# Patient Record
Sex: Female | Born: 1980 | Race: Black or African American | Hispanic: No | Marital: Single | Smoking: Never smoker
Health system: Southern US, Community
[De-identification: ages and names within clinical notes are randomized; demographics above are authoritative.]

## PROBLEM LIST (undated history)

## (undated) DIAGNOSIS — M1990 Unspecified osteoarthritis, unspecified site: Secondary | ICD-10-CM

## (undated) DIAGNOSIS — N898 Other specified noninflammatory disorders of vagina: Secondary | ICD-10-CM

## (undated) DIAGNOSIS — M25519 Pain in unspecified shoulder: Secondary | ICD-10-CM

## (undated) DIAGNOSIS — F32A Depression, unspecified: Secondary | ICD-10-CM

## (undated) DIAGNOSIS — R87629 Unspecified abnormal cytological findings in specimens from vagina: Secondary | ICD-10-CM

## (undated) DIAGNOSIS — M7918 Myalgia, other site: Secondary | ICD-10-CM

## (undated) DIAGNOSIS — I10 Essential (primary) hypertension: Secondary | ICD-10-CM

## (undated) DIAGNOSIS — T7840XA Allergy, unspecified, initial encounter: Secondary | ICD-10-CM

## (undated) DIAGNOSIS — Z8619 Personal history of other infectious and parasitic diseases: Secondary | ICD-10-CM

## (undated) DIAGNOSIS — D649 Anemia, unspecified: Secondary | ICD-10-CM

## (undated) DIAGNOSIS — B9689 Other specified bacterial agents as the cause of diseases classified elsewhere: Secondary | ICD-10-CM

## (undated) DIAGNOSIS — G8929 Other chronic pain: Secondary | ICD-10-CM

## (undated) DIAGNOSIS — J039 Acute tonsillitis, unspecified: Secondary | ICD-10-CM

## (undated) DIAGNOSIS — M47812 Spondylosis without myelopathy or radiculopathy, cervical region: Secondary | ICD-10-CM

## (undated) DIAGNOSIS — F329 Major depressive disorder, single episode, unspecified: Secondary | ICD-10-CM

## (undated) DIAGNOSIS — N946 Dysmenorrhea, unspecified: Secondary | ICD-10-CM

## (undated) DIAGNOSIS — F909 Attention-deficit hyperactivity disorder, unspecified type: Secondary | ICD-10-CM

## (undated) DIAGNOSIS — F419 Anxiety disorder, unspecified: Secondary | ICD-10-CM

## (undated) DIAGNOSIS — M545 Low back pain: Principal | ICD-10-CM

## (undated) DIAGNOSIS — R5383 Other fatigue: Secondary | ICD-10-CM

## (undated) DIAGNOSIS — N750 Cyst of Bartholin's gland: Secondary | ICD-10-CM

## (undated) DIAGNOSIS — M797 Fibromyalgia: Secondary | ICD-10-CM

## (undated) DIAGNOSIS — N920 Excessive and frequent menstruation with regular cycle: Secondary | ICD-10-CM

## (undated) DIAGNOSIS — N760 Acute vaginitis: Secondary | ICD-10-CM

## (undated) HISTORY — DX: Acute vaginitis: N76.0

## (undated) HISTORY — DX: Unspecified abnormal cytological findings in specimens from vagina: R87.629

## (undated) HISTORY — DX: Other fatigue: R53.83

## (undated) HISTORY — DX: Anxiety disorder, unspecified: F41.9

## (undated) HISTORY — DX: Other specified noninflammatory disorders of vagina: N89.8

## (undated) HISTORY — DX: Essential (primary) hypertension: I10

## (undated) HISTORY — DX: Anemia, unspecified: D64.9

## (undated) HISTORY — DX: Myalgia, other site: M79.18

## (undated) HISTORY — DX: Cyst of Bartholin's gland: N75.0

## (undated) HISTORY — DX: Low back pain: M54.5

## (undated) HISTORY — DX: Unspecified osteoarthritis, unspecified site: M19.90

## (undated) HISTORY — DX: Pain in unspecified shoulder: M25.519

## (undated) HISTORY — DX: Attention-deficit hyperactivity disorder, unspecified type: F90.9

## (undated) HISTORY — DX: Depression, unspecified: F32.A

## (undated) HISTORY — PX: LUMBAR LAMINECTOMY: SHX95

## (undated) HISTORY — PX: CRYOTHERAPY: SHX1416

## (undated) HISTORY — DX: Personal history of other infectious and parasitic diseases: Z86.19

## (undated) HISTORY — PX: CARPAL TUNNEL RELEASE: SHX101

## (undated) HISTORY — DX: Major depressive disorder, single episode, unspecified: F32.9

## (undated) HISTORY — DX: Excessive and frequent menstruation with regular cycle: N92.0

## (undated) HISTORY — DX: Dysmenorrhea, unspecified: N94.6

## (undated) HISTORY — DX: Allergy, unspecified, initial encounter: T78.40XA

## (undated) HISTORY — DX: Spondylosis without myelopathy or radiculopathy, cervical region: M47.812

## (undated) HISTORY — DX: Other chronic pain: G89.29

## (undated) HISTORY — DX: Other specified bacterial agents as the cause of diseases classified elsewhere: B96.89

---
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
## 2002-05-25 ENCOUNTER — Encounter (INDEPENDENT_AMBULATORY_CARE_PROVIDER_SITE_OTHER): Payer: Self-pay | Admitting: *Deleted

## 2002-05-26 ENCOUNTER — Other Ambulatory Visit: Admission: RE | Admit: 2002-05-26 | Discharge: 2002-05-26 | Payer: Self-pay | Admitting: Family Medicine

## 2002-05-26 ENCOUNTER — Encounter: Admission: RE | Admit: 2002-05-26 | Discharge: 2002-05-26 | Payer: Self-pay | Admitting: Family Medicine

## 2002-09-30 ENCOUNTER — Encounter: Payer: Self-pay | Admitting: Family Medicine

## 2002-09-30 ENCOUNTER — Inpatient Hospital Stay (HOSPITAL_COMMUNITY): Admission: AD | Admit: 2002-09-30 | Discharge: 2002-09-30 | Payer: Self-pay | Admitting: *Deleted

## 2002-10-11 ENCOUNTER — Inpatient Hospital Stay (HOSPITAL_COMMUNITY): Admission: AD | Admit: 2002-10-11 | Discharge: 2002-10-11 | Payer: Self-pay | Admitting: *Deleted

## 2002-11-13 ENCOUNTER — Emergency Department (HOSPITAL_COMMUNITY): Admission: EM | Admit: 2002-11-13 | Discharge: 2002-11-13 | Payer: Self-pay | Admitting: Emergency Medicine

## 2002-11-28 ENCOUNTER — Inpatient Hospital Stay (HOSPITAL_COMMUNITY): Admission: AD | Admit: 2002-11-28 | Discharge: 2002-11-28 | Payer: Self-pay | Admitting: Family Medicine

## 2002-12-16 ENCOUNTER — Ambulatory Visit (HOSPITAL_COMMUNITY): Admission: RE | Admit: 2002-12-16 | Discharge: 2002-12-16 | Payer: Self-pay | Admitting: Family Medicine

## 2003-01-29 ENCOUNTER — Inpatient Hospital Stay (HOSPITAL_COMMUNITY): Admission: AD | Admit: 2003-01-29 | Discharge: 2003-01-29 | Payer: Self-pay | Admitting: *Deleted

## 2003-02-02 ENCOUNTER — Inpatient Hospital Stay (HOSPITAL_COMMUNITY): Admission: AD | Admit: 2003-02-02 | Discharge: 2003-02-02 | Payer: Self-pay | Admitting: *Deleted

## 2003-05-07 ENCOUNTER — Inpatient Hospital Stay (HOSPITAL_COMMUNITY): Admission: AD | Admit: 2003-05-07 | Discharge: 2003-05-10 | Payer: Self-pay | Admitting: Obstetrics and Gynecology

## 2003-09-04 ENCOUNTER — Emergency Department (HOSPITAL_COMMUNITY): Admission: EM | Admit: 2003-09-04 | Discharge: 2003-09-05 | Payer: Self-pay | Admitting: Emergency Medicine

## 2003-09-18 ENCOUNTER — Emergency Department (HOSPITAL_COMMUNITY): Admission: EM | Admit: 2003-09-18 | Discharge: 2003-09-18 | Payer: Self-pay | Admitting: Emergency Medicine

## 2003-10-15 ENCOUNTER — Emergency Department (HOSPITAL_COMMUNITY): Admission: EM | Admit: 2003-10-15 | Discharge: 2003-10-15 | Payer: Self-pay | Admitting: Emergency Medicine

## 2005-10-27 ENCOUNTER — Emergency Department (HOSPITAL_COMMUNITY): Admission: AD | Admit: 2005-10-27 | Discharge: 2005-10-27 | Payer: Self-pay | Admitting: Family Medicine

## 2005-11-03 ENCOUNTER — Emergency Department (HOSPITAL_COMMUNITY): Admission: EM | Admit: 2005-11-03 | Discharge: 2005-11-03 | Payer: Self-pay | Admitting: Family Medicine

## 2006-02-22 ENCOUNTER — Inpatient Hospital Stay (HOSPITAL_COMMUNITY): Admission: AD | Admit: 2006-02-22 | Discharge: 2006-02-22 | Payer: Self-pay | Admitting: Obstetrics

## 2006-08-23 ENCOUNTER — Encounter (INDEPENDENT_AMBULATORY_CARE_PROVIDER_SITE_OTHER): Payer: Self-pay | Admitting: *Deleted

## 2006-11-29 ENCOUNTER — Inpatient Hospital Stay (HOSPITAL_COMMUNITY): Admission: AD | Admit: 2006-11-29 | Discharge: 2006-11-30 | Payer: Self-pay | Admitting: Obstetrics

## 2007-11-11 ENCOUNTER — Emergency Department (HOSPITAL_COMMUNITY): Admission: EM | Admit: 2007-11-11 | Discharge: 2007-11-11 | Payer: Self-pay | Admitting: Emergency Medicine

## 2008-02-05 ENCOUNTER — Emergency Department (HOSPITAL_COMMUNITY): Admission: EM | Admit: 2008-02-05 | Discharge: 2008-02-05 | Payer: Self-pay | Admitting: Family Medicine

## 2008-05-17 ENCOUNTER — Emergency Department (HOSPITAL_COMMUNITY): Admission: EM | Admit: 2008-05-17 | Discharge: 2008-05-17 | Payer: Self-pay | Admitting: Emergency Medicine

## 2008-11-02 ENCOUNTER — Emergency Department (HOSPITAL_COMMUNITY): Admission: EM | Admit: 2008-11-02 | Discharge: 2008-11-02 | Payer: Self-pay | Admitting: Family Medicine

## 2009-02-28 ENCOUNTER — Emergency Department (HOSPITAL_COMMUNITY): Admission: EM | Admit: 2009-02-28 | Discharge: 2009-02-28 | Payer: Self-pay | Admitting: Family Medicine

## 2009-03-17 ENCOUNTER — Emergency Department (HOSPITAL_COMMUNITY): Admission: EM | Admit: 2009-03-17 | Discharge: 2009-03-17 | Payer: Self-pay | Admitting: Emergency Medicine

## 2009-08-13 ENCOUNTER — Emergency Department (HOSPITAL_COMMUNITY): Admission: EM | Admit: 2009-08-13 | Discharge: 2009-08-13 | Payer: Self-pay | Admitting: Emergency Medicine

## 2010-09-13 LAB — POCT URINALYSIS DIP (DEVICE)
Glucose, UA: NEGATIVE mg/dL
Hgb urine dipstick: NEGATIVE
Specific Gravity, Urine: 1.025 (ref 1.005–1.030)
Urobilinogen, UA: 1 mg/dL (ref 0.0–1.0)
pH: 5.5 (ref 5.0–8.0)

## 2010-09-29 LAB — POCT URINALYSIS DIP (DEVICE)
Bilirubin Urine: NEGATIVE
Glucose, UA: NEGATIVE mg/dL
Glucose, UA: NEGATIVE mg/dL
Nitrite: NEGATIVE
Protein, ur: 100 mg/dL — AB
Specific Gravity, Urine: 1.025 (ref 1.005–1.030)
Specific Gravity, Urine: >=1.03 (ref 1.005–1.030)
Urobilinogen, UA: 0.2 mg/dL (ref 0.0–1.0)
Urobilinogen, UA: 1 mg/dL (ref 0.0–1.0)
Urobilinogen, UA: 1 mg/dL (ref 0.0–1.0)

## 2010-09-29 LAB — WET PREP, GENITAL
Clue Cells Wet Prep HPF POC: NONE SEEN
Trich, Wet Prep: NONE SEEN

## 2010-09-29 LAB — GC/CHLAMYDIA PROBE AMP, GENITAL
Chlamydia, DNA Probe: NEGATIVE
GC Probe Amp, Genital: NEGATIVE

## 2011-03-23 LAB — POCT PREGNANCY, URINE: Preg Test, Ur: NEGATIVE

## 2011-04-12 LAB — URINALYSIS, ROUTINE W REFLEX MICROSCOPIC
Bilirubin Urine: NEGATIVE
Ketones, ur: NEGATIVE
Nitrite: NEGATIVE
Urobilinogen, UA: 0.2

## 2011-04-12 LAB — POCT PREGNANCY, URINE: Preg Test, Ur: NEGATIVE

## 2011-04-12 LAB — URINE MICROSCOPIC-ADD ON

## 2011-04-12 LAB — WET PREP, GENITAL: Yeast Wet Prep HPF POC: NONE SEEN

## 2011-04-12 LAB — GC/CHLAMYDIA PROBE AMP, GENITAL: GC Probe Amp, Genital: NEGATIVE

## 2013-03-29 ENCOUNTER — Emergency Department (HOSPITAL_COMMUNITY): Payer: Medicaid Other

## 2013-03-29 ENCOUNTER — Emergency Department (HOSPITAL_COMMUNITY)
Admission: EM | Admit: 2013-03-29 | Discharge: 2013-03-29 | Disposition: A | Discharge status: Home / Self Care | Payer: Medicaid Other | Attending: Emergency Medicine | Admitting: Emergency Medicine

## 2013-03-29 DIAGNOSIS — R109 Unspecified abdominal pain: Secondary | ICD-10-CM

## 2013-03-29 DIAGNOSIS — Z3202 Encounter for pregnancy test, result negative: Secondary | ICD-10-CM | POA: Insufficient documentation

## 2013-03-29 DIAGNOSIS — M545 Low back pain, unspecified: Secondary | ICD-10-CM | POA: Insufficient documentation

## 2013-03-29 DIAGNOSIS — R1032 Left lower quadrant pain: Secondary | ICD-10-CM | POA: Insufficient documentation

## 2013-03-29 LAB — COMPREHENSIVE METABOLIC PANEL
ALT: 11 U/L (ref 0–35)
AST: 17 U/L (ref 0–37)
Albumin: 3.6 g/dL (ref 3.5–5.2)
BUN: 11 mg/dL (ref 6–23)
CO2: 28 mEq/L (ref 19–32)
Calcium: 9.4 mg/dL (ref 8.4–10.5)
Chloride: 100 mEq/L (ref 96–112)
GFR calc non Af Amer: >90 mL/min (ref >90)
Potassium: 3.5 mEq/L (ref 3.5–5.1)
Sodium: 135 mEq/L (ref 135–145)
Total Bilirubin: <0.1 mg/dL — ABNORMAL LOW (ref 0.3–1.2)
Total Protein: 7.1 g/dL (ref 6.0–8.3)

## 2013-03-29 LAB — CBC WITH DIFFERENTIAL/PLATELET
Basophils Relative: 0 % (ref 0–1)
Eosinophils Absolute: 0.1 10*3/uL (ref 0.0–0.7)
Eosinophils Relative: 1 % (ref 0–5)
Hemoglobin: 11.6 g/dL — ABNORMAL LOW (ref 12.0–15.0)
Lymphs Abs: 2.7 10*3/uL (ref 0.7–4.0)
MCH: 31.5 pg (ref 26.0–34.0)
MCHC: 34.9 g/dL (ref 30.0–36.0)
MCV: 90.2 fL (ref 78.0–100.0)
Monocytes Relative: 7 % (ref 3–12)
Neutrophils Relative %: 54 % (ref 43–77)
Platelets: 237 10*3/uL (ref 150–400)

## 2013-03-29 LAB — URINALYSIS, ROUTINE W REFLEX MICROSCOPIC
Glucose, UA: NEGATIVE mg/dL
Protein, ur: 30 mg/dL — AB

## 2013-03-29 LAB — URINE MICROSCOPIC-ADD ON

## 2013-03-29 LAB — PREGNANCY, URINE: Preg Test, Ur: NEGATIVE

## 2013-03-29 MED ORDER — TRAMADOL HCL 50 MG PO TABS: 50.0000 mg | ORAL_TABLET | Freq: Four times a day (QID) | ORAL | Status: DC | PRN

## 2013-03-29 MED ORDER — SODIUM CHLORIDE 0.9 % IV SOLN: INTRAVENOUS | Status: DC

## 2013-03-29 MED ORDER — IOHEXOL 300 MG/ML  SOLN
50.0000 mL | Freq: Once | INTRAMUSCULAR | Status: AC | PRN
  Administered 2013-03-29: 50 mL via INTRAVENOUS

## 2013-03-29 MED ORDER — SODIUM CHLORIDE 0.9 % IV BOLUS (SEPSIS)
250.0000 mL | Freq: Once | INTRAVENOUS | Status: AC
  Administered 2013-03-29: 03:00:00 via INTRAVENOUS

## 2013-03-29 MED ORDER — HYDROMORPHONE HCL PF 1 MG/ML IJ SOLN
1.0000 mg | Freq: Once | INTRAMUSCULAR | Status: AC
  Administered 2013-03-29: 1 mg via INTRAVENOUS
  Filled 2013-03-29: qty 1

## 2013-03-29 MED ORDER — ONDANSETRON HCL 4 MG/2ML IJ SOLN
4.0000 mg | Freq: Once | INTRAMUSCULAR | Status: AC
  Administered 2013-03-29: 4 mg via INTRAVENOUS
  Filled 2013-03-29: qty 2

## 2013-03-29 MED ORDER — IOHEXOL 300 MG/ML  SOLN
100.0000 mL | Freq: Once | INTRAMUSCULAR | Status: AC | PRN
  Administered 2013-03-29: 100 mL via INTRAVENOUS

## 2013-03-29 NOTE — ED Notes (Signed)
Pt having pain to left lower abd that is sharp in nature, states she was exercising the other day and wonders if she pulled something.  Pt also c/o lower back pain

## 2013-03-29 NOTE — ED Notes (Signed)
Patient in room cursing and complaining about the time she has been waiting for discharge. Charge nurse brenda at side. Informed her dr. Fredderick Severance was in another patient's room and  would see her asap

## 2013-03-29 NOTE — ED Provider Notes (Signed)
CSN: [id number]     Arrival date & time 03/29/13  0004 History   First MD Initiated Contact with Patient 03/29/13 0242     Chief Complaint  Patient presents with  . Abdominal Pain  . Back Pain   (Consider location/radiation/quality/duration/timing/severity/associated sxs/prior Treatment) Patient is a 32 y.o. female presenting with abdominal pain and back pain. The history is provided by the patient.  Abdominal Pain Associated symptoms: no chest pain, no dysuria, no fever, no hematuria, no nausea, no shortness of breath and no vomiting   Back Pain Associated symptoms: abdominal pain   Associated symptoms: no chest pain, no dysuria, no fever and no headaches    patient with onset of left lower corner abdominal pain sharp in nature 7-8/10. Nonradiating. That started yesterday. Patient initially thought maybe she pulled something with exercising earlier however the pain is not made worse by movement. Not associated with nausea vomiting or diarrhea no fever. Patient also does have some mild low back pain on the left side. Patient without a history of similar problems.  No past medical history on file. No past surgical history on file. No family history on file. History  Substance Use Topics  . Smoking status: Not on file  . Smokeless tobacco: Not on file  . Alcohol Use: Not on file   OB History   No data available     Review of Systems  Constitutional: Negative for fever.  HENT: Negative for congestion.   Eyes: Negative for redness.  Respiratory: Negative for shortness of breath.   Cardiovascular: Negative for chest pain.  Gastrointestinal: Positive for abdominal pain. Negative for nausea and vomiting.  Genitourinary: Negative for dysuria and hematuria.  Musculoskeletal: Positive for back pain.  Neurological: Negative for headaches.  Hematological: Does not bruise/bleed easily.  Psychiatric/Behavioral: Negative for confusion.    Allergies  Review of patient's allergies  indicates no known allergies.  Home Medications   Current Outpatient Rx  Name  Route  Sig  Dispense  Refill  . naproxen (NAPROSYN) 250 MG tablet   Oral   Take 250 mg by mouth 2 (two) times daily as needed.         . traMADol (ULTRAM) 50 MG tablet   Oral   Take 1 tablet (50 mg total) by mouth every 6 (six) hours as needed.   20 tablet   0    BP 114/60  Pulse 75  Temp(Src) 97.8 F (36.6 C) (Oral)  Resp 16  Ht 5\' 6"  (1.676 m)  Wt 170 lb (77.111 kg)  BMI 27.45 kg/m2  SpO2 98%  LMP 03/19/2013 Physical Exam  Nursing note and vitals reviewed. Constitutional: She is oriented to person, place, and time. She appears well-developed and well-nourished. No distress.  HENT:  Head: Normocephalic and atraumatic.  Mouth/Throat: Oropharynx is clear and moist.  Eyes: Conjunctivae and EOM are normal. Pupils are equal, round, and reactive to light.  Neck: Normal range of motion.  Cardiovascular: Normal rate and regular rhythm.   No murmur heard. Pulmonary/Chest: Effort normal and breath sounds normal. No respiratory distress.  Abdominal: Soft. Bowel sounds are normal. There is tenderness.  Mild tenderness left lower quadrant no guarding  Musculoskeletal: Normal range of motion. She exhibits no edema.  Neurological: She is alert and oriented to person, place, and time. No cranial nerve deficit. She exhibits normal muscle tone. Coordination normal.  Skin: Skin is warm.    ED Course  Procedures (including critical care time) Labs Review Labs Reviewed  URINALYSIS,  ROUTINE W REFLEX MICROSCOPIC - Abnormal; Notable for the following:    Specific Gravity, Urine >1.030 (*)    Hgb urine dipstick TRACE (*)    Protein, ur 30 (*)    All other components within normal limits  URINE MICROSCOPIC-ADD ON - Abnormal; Notable for the following:    Squamous Epithelial / LPF MANY (*)    Bacteria, UA MANY (*)    All other components within normal limits  COMPREHENSIVE METABOLIC PANEL - Abnormal;  Notable for the following:    Glucose, Bld 102 (*)    Total Bilirubin <0.1 (*)    All other components within normal limits  CBC WITH DIFFERENTIAL - Abnormal; Notable for the following:    RBC 3.68 (*)    Hemoglobin 11.6 (*)    HCT 33.2 (*)    All other components within normal limits  URINE CULTURE  PREGNANCY, URINE   Results for orders placed during the hospital encounter of 03/29/13  URINALYSIS, ROUTINE W REFLEX MICROSCOPIC      Result Value Range   Color, Urine YELLOW  YELLOW   APPearance CLEAR  CLEAR   Specific Gravity, Urine >1.030 (*) 1.005 - 1.030   pH 6.0  5.0 - 8.0   Glucose, UA NEGATIVE  NEGATIVE mg/dL   Hgb urine dipstick TRACE (*) NEGATIVE   Bilirubin Urine NEGATIVE  NEGATIVE   Ketones, ur NEGATIVE  NEGATIVE mg/dL   Protein, ur 30 (*) NEGATIVE mg/dL   Urobilinogen, UA 0.2  0.0 - 1.0 mg/dL   Nitrite NEGATIVE  NEGATIVE   Leukocytes, UA NEGATIVE  NEGATIVE  PREGNANCY, URINE      Result Value Range   Preg Test, Ur NEGATIVE  NEGATIVE  URINE MICROSCOPIC-ADD ON      Result Value Range   Squamous Epithelial / LPF MANY (*) RARE   WBC, UA 3-6  <3 WBC/hpf   RBC / HPF 0-2  <3 RBC/hpf   Bacteria, UA MANY (*) RARE  COMPREHENSIVE METABOLIC PANEL      Result Value Range   Sodium 135  135 - 145 mEq/L   Potassium 3.5  3.5 - 5.1 mEq/L   Chloride 100  96 - 112 mEq/L   CO2 28  19 - 32 mEq/L   Glucose, Bld 102 (*) 70 - 99 mg/dL   BUN 11  6 - 23 mg/dL   Creatinine, Ser 1.61  0.50 - 1.10 mg/dL   Calcium 9.4  8.4 - 09.6 mg/dL   Total Protein 7.1  6.0 - 8.3 g/dL   Albumin 3.6  3.5 - 5.2 g/dL   AST 17  0 - 37 U/L   ALT 11  0 - 35 U/L   Alkaline Phosphatase 48  39 - 117 U/L   Total Bilirubin <0.1 (*) 0.3 - 1.2 mg/dL   GFR calc non Af Amer >90  >90 mL/min   GFR calc Af Amer >90  >90 mL/min  CBC WITH DIFFERENTIAL      Result Value Range   WBC 7.3  4.0 - 10.5 K/uL   RBC 3.68 (*) 3.87 - 5.11 MIL/uL   Hemoglobin 11.6 (*) 12.0 - 15.0 g/dL   HCT 04.5 (*) 40.9 - 81.1 %   MCV 90.2   78.0 - 100.0 fL   MCH 31.5  26.0 - 34.0 pg   MCHC 34.9  30.0 - 36.0 g/dL   RDW 91.4  78.2 - 95.6 %   Platelets 237  150 - 400 K/uL   Neutrophils Relative %  54  43 - 77 %   Neutro Abs 3.9  1.7 - 7.7 K/uL   Lymphocytes Relative 38  12 - 46 %   Lymphs Abs 2.7  0.7 - 4.0 K/uL   Monocytes Relative 7  3 - 12 %   Monocytes Absolute 0.5  0.1 - 1.0 K/uL   Eosinophils Relative 1  0 - 5 %   Eosinophils Absolute 0.1  0.0 - 0.7 K/uL   Basophils Relative 0  0 - 1 %   Basophils Absolute 0.0  0.0 - 0.1 K/uL   Patient's CT scan results of her abdomen pelvis never crossed over. However over report and written report was provided on paper. The impression was no acute intra-abdominal or pelvic process. There was level scoliosis with associated sacralized left L5 vertebral and mild left hip osteoarthritis.  Patient informed of the results. Labs without any snig. abnormalities. Etiology of the abdominal pain is not clear. Imaging Review No results found.  MDM   1. Abdominal pain    Patient improved in the emergency department with pain medication workup without any specific findings. No leukocytosis no liver function test abnormalities. No urinalysis consistent with urinary tract infection CT of abdomen and pelvis without any findings to explain the abdominal pain. Patient was sent home with tramadol and resource guide provided to help her find a local Dr. she is new to the area.  Patient did become upset when we were not able to get the CT scan results in a timely manner she was told that if she needed to go home to her children she could go and she could come back for the results. Patient decided to stay. We eventually got a written report of the CT results.   Shelda Jakes, MD 03/29/13 [phone]

## 2013-03-31 LAB — URINE CULTURE

## 2013-05-19 ENCOUNTER — Ambulatory Visit: Payer: Self-pay | Admitting: Family Medicine

## 2013-06-04 ENCOUNTER — Emergency Department (HOSPITAL_COMMUNITY): Payer: Medicaid Other

## 2013-06-04 ENCOUNTER — Emergency Department (HOSPITAL_COMMUNITY)
Admission: EM | Admit: 2013-06-04 | Discharge: 2013-06-04 | Disposition: A | Discharge status: Home / Self Care | Payer: Medicaid Other | Attending: Emergency Medicine | Admitting: Emergency Medicine

## 2013-06-04 ENCOUNTER — Encounter (HOSPITAL_COMMUNITY): Payer: Self-pay | Admitting: Emergency Medicine

## 2013-06-04 DIAGNOSIS — M542 Cervicalgia: Secondary | ICD-10-CM | POA: Insufficient documentation

## 2013-06-04 DIAGNOSIS — Z87891 Personal history of nicotine dependence: Secondary | ICD-10-CM | POA: Insufficient documentation

## 2013-06-04 DIAGNOSIS — Z87828 Personal history of other (healed) physical injury and trauma: Secondary | ICD-10-CM | POA: Insufficient documentation

## 2013-06-04 DIAGNOSIS — J029 Acute pharyngitis, unspecified: Secondary | ICD-10-CM | POA: Insufficient documentation

## 2013-06-04 DIAGNOSIS — R259 Unspecified abnormal involuntary movements: Secondary | ICD-10-CM | POA: Insufficient documentation

## 2013-06-04 LAB — RAPID STREP SCREEN (MED CTR MEBANE ONLY): Streptococcus, Group A Screen (Direct): NEGATIVE

## 2013-06-04 MED ORDER — HYDROCODONE-ACETAMINOPHEN 5-325 MG PO TABS: 2.0000 | ORAL_TABLET | ORAL | Status: DC | PRN

## 2013-06-04 MED ORDER — KETOROLAC TROMETHAMINE 60 MG/2ML IM SOLN
60.0000 mg | Freq: Once | INTRAMUSCULAR | Status: AC
  Administered 2013-06-04: 60 mg via INTRAMUSCULAR
  Filled 2013-06-04: qty 2
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
## 2013-06-05 ENCOUNTER — Encounter (HOSPITAL_COMMUNITY): Payer: Self-pay | Admitting: Emergency Medicine

## 2013-06-05 ENCOUNTER — Inpatient Hospital Stay: Admission: RE | Admit: 2013-06-05 | Payer: Self-pay | Admitting: Internal Medicine

## 2013-06-05 ENCOUNTER — Inpatient Hospital Stay (HOSPITAL_COMMUNITY)
Admission: EM | Admit: 2013-06-05 | Discharge: 2013-06-08 | DRG: 133 | Disposition: A | Discharge status: Home / Self Care | Payer: Medicaid Other | Attending: Internal Medicine | Admitting: Internal Medicine

## 2013-06-05 ENCOUNTER — Emergency Department (HOSPITAL_COMMUNITY): Payer: Medicaid Other

## 2013-06-05 DIAGNOSIS — Z87891 Personal history of nicotine dependence: Secondary | ICD-10-CM

## 2013-06-05 DIAGNOSIS — J039 Acute tonsillitis, unspecified: Secondary | ICD-10-CM | POA: Diagnosis present

## 2013-06-05 DIAGNOSIS — J351 Hypertrophy of tonsils: Secondary | ICD-10-CM | POA: Diagnosis present

## 2013-06-05 DIAGNOSIS — D72829 Elevated white blood cell count, unspecified: Secondary | ICD-10-CM

## 2013-06-05 DIAGNOSIS — J36 Peritonsillar abscess: Principal | ICD-10-CM

## 2013-06-05 DIAGNOSIS — E871 Hypo-osmolality and hyponatremia: Secondary | ICD-10-CM

## 2013-06-05 DIAGNOSIS — E876 Hypokalemia: Secondary | ICD-10-CM

## 2013-06-05 HISTORY — DX: Acute tonsillitis, unspecified: J03.90

## 2013-06-05 LAB — BASIC METABOLIC PANEL
BUN: 7 mg/dL (ref 6–23)
CO2: 25 mEq/L (ref 19–32)
Calcium: 8.4 mg/dL (ref 8.4–10.5)
Chloride: 96 mEq/L (ref 96–112)
Creatinine, Ser: 0.63 mg/dL (ref 0.50–1.10)
Glucose, Bld: 117 mg/dL — ABNORMAL HIGH (ref 70–99)
Potassium: 3.2 mEq/L — ABNORMAL LOW (ref 3.5–5.1)

## 2013-06-05 LAB — CBC WITH DIFFERENTIAL/PLATELET
Basophils Absolute: 0 10*3/uL (ref 0.0–0.1)
Eosinophils Relative: 0 % (ref 0–5)
HCT: 32.7 % — ABNORMAL LOW (ref 36.0–46.0)
Hemoglobin: 11 g/dL — ABNORMAL LOW (ref 12.0–15.0)
Lymphocytes Relative: 5 % — ABNORMAL LOW (ref 12–46)
MCV: 89.8 fL (ref 78.0–100.0)
Monocytes Absolute: 1.2 10*3/uL — ABNORMAL HIGH (ref 0.1–1.0)
Monocytes Relative: 7 % (ref 3–12)
Neutro Abs: 15 10*3/uL — ABNORMAL HIGH (ref 1.7–7.7)
Platelets: 217 10*3/uL (ref 150–400)
RDW: 13.1 % (ref 11.5–15.5)
WBC: 17 10*3/uL — ABNORMAL HIGH (ref 4.0–10.5)

## 2013-06-05 MED ORDER — ACETAMINOPHEN 650 MG RE SUPP: 650.0000 mg | Freq: Four times a day (QID) | RECTAL | Status: DC | PRN

## 2013-06-05 MED ORDER — IOHEXOL 300 MG/ML  SOLN
75.0000 mL | Freq: Once | INTRAMUSCULAR | Status: AC | PRN
  Administered 2013-06-05: 75 mL via INTRAVENOUS

## 2013-06-05 MED ORDER — MORPHINE SULFATE 2 MG/ML IJ SOLN
1.0000 mg | INTRAMUSCULAR | Status: DC | PRN
  Administered 2013-06-05 – 2013-06-06 (×3): 1 mg via INTRAVENOUS
  Filled 2013-06-05 (×4): qty 1

## 2013-06-05 MED ORDER — ONDANSETRON HCL 4 MG/2ML IJ SOLN
4.0000 mg | Freq: Once | INTRAMUSCULAR | Status: AC
  Administered 2013-06-05: 4 mg via INTRAVENOUS
  Filled 2013-06-05: qty 2

## 2013-06-05 MED ORDER — DEXTROSE 5 % IV SOLN
1.0000 g | INTRAVENOUS | Status: DC
  Administered 2013-06-05: 1 g via INTRAVENOUS
  Filled 2013-06-05 (×2): qty 10

## 2013-06-05 MED ORDER — MORPHINE SULFATE 4 MG/ML IJ SOLN
4.0000 mg | Freq: Once | INTRAMUSCULAR | Status: AC
  Administered 2013-06-05: 4 mg via INTRAVENOUS
  Filled 2013-06-05: qty 1

## 2013-06-05 MED ORDER — SODIUM CHLORIDE 0.9 % IV BOLUS (SEPSIS)
500.0000 mL | Freq: Once | INTRAVENOUS | Status: AC
  Administered 2013-06-05: 1000 mL via INTRAVENOUS

## 2013-06-05 MED ORDER — ENOXAPARIN SODIUM 40 MG/0.4ML ~~LOC~~ SOLN
40.0000 mg | SUBCUTANEOUS | Status: DC
  Administered 2013-06-05: 17:00:00 40 mg via SUBCUTANEOUS
  Filled 2013-06-05 (×2): qty 0.4

## 2013-06-05 MED ORDER — ACETAMINOPHEN 325 MG PO TABS: 650.0000 mg | ORAL_TABLET | Freq: Four times a day (QID) | ORAL | Status: DC | PRN

## 2013-06-05 MED ORDER — ONDANSETRON HCL 4 MG PO TABS: 4.0000 mg | ORAL_TABLET | Freq: Four times a day (QID) | ORAL | Status: DC | PRN

## 2013-06-05 MED ORDER — SODIUM CHLORIDE 0.9 % IV SOLN
3.0000 g | Freq: Once | INTRAVENOUS | Status: AC
  Administered 2013-06-05: 3 g via INTRAVENOUS
  Filled 2013-06-05: qty 3

## 2013-06-05 MED ORDER — POTASSIUM CHLORIDE IN NACL 20-0.9 MEQ/L-% IV SOLN
INTRAVENOUS | Status: DC
  Administered 2013-06-05: 125 mL/h via INTRAVENOUS
  Administered 2013-06-06 – 2013-06-07 (×2): via INTRAVENOUS
  Administered 2013-06-07: 1000 mL via INTRAVENOUS
  Administered 2013-06-07: 19:00:00 via INTRAVENOUS
  Filled 2013-06-05 (×10): qty 1000

## 2013-06-05 MED ORDER — ONDANSETRON HCL 4 MG/2ML IJ SOLN: 4.0000 mg | Freq: Four times a day (QID) | INTRAMUSCULAR | Status: DC | PRN

## 2013-06-05 MED ORDER — PHENOL 1.4 % MT LIQD
1.0000 | OROMUCOSAL | Status: DC | PRN
  Administered 2013-06-05: 22:00:00 1 via OROMUCOSAL
  Filled 2013-06-05: qty 177

## 2013-06-05 MED ORDER — ACETAMINOPHEN 650 MG RE SUPP
650.0000 mg | Freq: Once | RECTAL | Status: AC
  Administered 2013-06-05: 650 mg via RECTAL
  Filled 2013-06-05: qty 1

## 2013-06-05 MED ORDER — SODIUM CHLORIDE 0.9 % IV SOLN
INTRAVENOUS | Status: AC
  Administered 2013-06-05: 15:00:00 125 mL/h via INTRAVENOUS

## 2013-06-05 MED ORDER — IBUPROFEN 600 MG PO TABS
600.0000 mg | ORAL_TABLET | Freq: Four times a day (QID) | ORAL | Status: DC | PRN
  Administered 2013-06-06: 600 mg via ORAL
  Filled 2013-06-05: qty 1

## 2013-06-05 MED ORDER — ACETAMINOPHEN 160 MG/5ML PO SOLN
650.0000 mg | Freq: Once | ORAL | Status: DC
  Filled 2013-06-05: qty 20.3

## 2013-06-05 NOTE — Progress Notes (Signed)
Veronica [name] [id number]  Code Status: FUll3  Admission Data: 06/05/2013 3:04 PM  Attending Provider: Clent Ridges PCP:No PCP Per Patient  Consults/ Treatment Team:    Veronica [name] is a 32 y.o. female patient admitted from ED awake, alert - oriented X 3 - no acute distress noted. VSS - Blood pressure 140/76, pulse 82, temperature 100.6 F (38.1 C), temperature source Oral, resp. rate 23, last menstrual period 05/28/2013, SpO2 99.00%. no c/o shortness of breath, no c/o chest pain. IV Fluids: IV in place, occlusive dsg intact without redness, IV cath forearm right, condition patent and no redness  normal saline.  Allergies: No Known Allergies    History reviewed. No pertinent past medical history.  Medications Prior to Admission  Medication Sig Dispense Refill  . HYDROcodone-acetaminophen (NORCO) 5-325 MG per tablet Take 2 tablets by mouth every 4 (four) hours as needed.  6 tablet  0    History: obtained from the patient.   Orientation to room, and floor completed with information packet given to patient/family. Admission INP armband ID verified with patient/family, and in place.  SR up x 2, fall assessment complete, with patient able to verbalize understanding of risk associated with falls, and verbalized understanding to call nsg before up out of bed. Call light within reach, patient able to voice, and demonstrate understanding. Skin, clean-dry- intact without evidence of bruising, or skin tears.  No evidence of skin break down noted on exam.  ?  Will cont to eval and treat per MD orders.  Al Decant, [place]  06/05/2013 3:04 PM

## 2013-06-05 NOTE — Care Management Note (Signed)
    Page 1 of 1   06/09/2013     3:18:46 PM   CARE MANAGEMENT NOTE 06/09/2013  Patient:  Veronica [name], Veronica [name]   Account Number:  [id number]  Date Initiated:  06/05/2013  Documentation initiated by:  Letha Cape  Subjective/Objective Assessment:   dx tonsillitis  admit- from home. pta indep.     Action/Plan:   Anticipated DC Date:  06/08/2013   Anticipated DC Plan:  HOME/SELF CARE      DC Planning Services  CM consult      Choice offered to / List presented to:             Status of service:  Completed, signed off Medicare Important Message given?   (If response is "NO", the following Medicare IM given date fields will be blank) Date Medicare IM given:   Date Additional Medicare IM given:    Discharge Disposition:  HOME/SELF CARE  Per UR Regulation:  Reviewed for med. necessity/level of care/duration of stay  If discussed at Long Length of Stay Meetings, dates discussed:    Comments:  06/09/13 15:17  Letha Cape RN, BSN [phone] patient dc home, no NCM referral , no needs anticipated.  06/05/13 16:25 Letha Cape RN, BSN [phone] patient from home, per progress note, patient did not take abx.  NCM will continue to follow for dc needs.

## 2013-06-05 NOTE — ED Notes (Signed)
Pt reports sore throat and ear pain for the past 4 days.  Pt reports difficulty swallowing d/t pain.  Pt denies any fever.

## 2013-06-05 NOTE — H&P (Addendum)
[facility] History and Physical  Veronica [name] WJX:[id number] DOB: 03/17/1981 DOA: 06/05/2013  Referring physician: EDP PCP: No PCP Per Patient   Chief Complaint: throat pain  HPI: Veronica [name] is a 32 y.o. female  Who was seen in [facility] ER on 12/11- given an abx for tonsilitis that she never filled.  Strep screen is still pending.   Went back to the ER today 12/12 with worsening throat pain, and painful swallowing.  Patient denies trouble breathing.  Admits to fever.  Positive sick contacts Left ear pain and sore lymph nodes  In the ER, labs showed a WBC count and a CT was done that showed no clear abscess.  Patient was sent from [facility]: [name] for possible ENT evaluation.   Review of Systems: Systems reviewed.  As above, otherwise negative  History reviewed. No pertinent past medical history. Past Surgical History  Procedure Laterality Date  . Brain surgery     Social History:  reports that she has quit smoking. She does not have any smokeless tobacco history on file. She reports that she does not drink alcohol or use illicit drugs.  No Known Allergies  Family Hx: HTN  Prior to Admission medications   Medication Sig Start Date End Date Taking? Authorizing Provider  HYDROcodone-acetaminophen (NORCO) 5-325 MG per tablet Take 2 tablets by mouth every 4 (four) hours as needed. 06/04/13  Yes Geoffery Lyons, MD   Physical Exam: Filed Vitals:   06/05/13 1518  BP: 141/78  Pulse: 73  Temp: 99.7 F (37.6 C)  Resp: 20    BP 141/78  Pulse 73  Temp(Src) 99.7 F (37.6 C) (Oral)  Resp 20  Ht 5\' 6"  (1.676 m)  Wt 74.707 kg (164 lb 11.2 oz)  BMI 26.60 kg/m2  SpO2 99%  LMP 05/28/2013  General appearance: alert and no distress Head: Normocephalic, without obvious abnormality, atraumatic Eyes: negative findings: pupils equal, round, reactive to light and accomodation Ears: normal TM's and external ear canals both ears Nose: Nares normal. Septum midline. Mucosa normal. No drainage  or sinus tenderness., no discharge Throat: abnormal findings: cheilitis Lungs: clear to auscultation bilaterally and normal percussion bilaterally Heart: regular rate and rhythm, S1, S2 normal, no murmur, click, rub or gallop Abdomen: soft, non-tender; bowel sounds normal; no masses,  no organomegaly Extremities: no edema, redness or tenderness in the calves or thighs Skin: Skin color, texture, turgor normal. No rashes or lesions Lymph nodes: Cervical adenopathy: anterior chains  Ill appearing- on phone speaking with son's school, No increased work of breathing       Dry mucous membranes; tongue dry with patches of white material, throat red Clear  Regular, no murmur +BS, soft, NT  No edema                       Tender LN chains in anterior neck- swollen         Labs on Admission:  Basic Metabolic Panel:  Recent Labs Lab 06/05/13 1042  NA 132*  K 3.2*  CL 96  CO2 25  GLUCOSE 117*  BUN 7  CREATININE 0.63  CALCIUM 8.4   Liver Function Tests: No results found for this basename: AST, ALT, ALKPHOS, BILITOT, PROT, ALBUMIN,  in the last 168 hours No results found for this basename: LIPASE, AMYLASE,  in the last 168 hours No results found for this basename: AMMONIA,  in the last 168 hours CBC:  Recent Labs Lab 06/05/13 1042  WBC 17.0*  NEUTROABS 15.0*  HGB 11.0*  HCT 32.7*  MCV 89.8  PLT 217   Cardiac Enzymes: No results found for this basename: CKTOTAL, CKMB, CKMBINDEX, TROPONINI,  in the last 168 hours  BNP (last 3 results) No results found for this basename: PROBNP,  in the last 8760 hours CBG: No results found for this basename: GLUCAP,  in the last 168 hours  Radiological Exams on Admission: Dg Cervical Spine Complete  06/04/2013   CLINICAL DATA:  Sore throat; left-sided neck pain.  EXAM: CERVICAL SPINE  4+ VIEWS  COMPARISON:  Cervical spine radiographs performed 05/17/2008  FINDINGS: There is no evidence of fracture or  subluxation. Vertebral bodies demonstrate normal height and alignment. Intervertebral disc spaces are preserved. Prevertebral soft tissues are within normal limits. The provided odontoid view demonstrates no significant abnormality.  The visualized lung apices are clear.  IMPRESSION: No evidence of fracture or subluxation along the cervical spine.   Electronically Signed   By: Roanna Raider M.D.   On: 06/04/2013 03:29   Ct Soft Tissue Neck W Contrast  06/05/2013   ADDENDUM REPORT: 06/05/2013 11:39  ADDENDUM: In the Impression: The airway is shifted to the right and remains patent. The sentence that states: Airway shifted to the right below the knee no remains patent , was inadvertently transcribed.   Electronically Signed   By: Salome Holmes M.D.   On: 06/05/2013 11:39   06/05/2013   CLINICAL DATA:  Esophageal, neck, and ear pain, difficulty swallowing  EXAM: CT NECK WITH CONTRAST  TECHNIQUE: Multidetector CT imaging of the neck was performed using the standard protocol following the bolus administration of intravenous contrast.  CONTRAST:  75mL OMNIPAQUE IOHEXOL 300 MG/ML  SOLN  COMPARISON:  None.  FINDINGS: Skull base is unremarkable.  There is diffuse enlargement of the tonsils on the left with a heterogeneous enhancement. No focal loculated fluid collections are identified. There is a mass effect upon the airway with shift to the right the airway is otherwise patent. There is obliteration of the parapharyngeal space on the left. An area of low attenuation projects within the prevertebral space, consistent with a component of prevertebral edema. The parapharyngeal space on the right is partially maintained.  Adenopathy is identified within the carotid chain lymph nodes largest on the left measuring 1.4 cm in short axis image 46 series 2. There is heterogeneous enhancement of the inferior aspect the salivary gland on the left. Prominent submandibular lymph nodes are identified.  The parotid glands are  symmetric. The right submandibular gland is unremarkable. Opacified vascular structures unremarkable. There is no evidence of free fluid or loculated fluid collections within the neck. The glottic and laryngeal regions are unremarkable. Evaluation of thoracic inlet is unremarkable. The lung apices are unremarkable.  IMPRESSION: Findings consistent with tonsillitis without definitive loculated fluid collection. Phlegmonous changes within the tonsils is a diagnostic consideration. These findings are appreciated on the left. Airway shifted to the right below-knee no remains patent. Adenopathy is appreciated within anterior and posterior cervical chain lymph nodes left greater than right. There findings which may represent a component of inflammatory changes within the salivary gland on the left.  Electronically Signed: By: Salome Holmes M.D. On: 06/05/2013 11:02      Assessment/Plan Active Problems:   Tonsillitis   Leukocytosis, unspecified   Hypokalemia   1. Tonsillitis- IV abx-rocephin, vital stable, as patient never filled her abx will give and if no better in AM, will consider ENT consult in AM- no abscess seen  on CT scan; no respiratory distress or decompensation- on room air 2. Leukocytosis- secondary to #1- monitor 3. Hypokalemia- replete in IVF 4. Hyponatremia- IVF    Code Status: full Family Communication: patient Disposition Plan: 2-3 days  Time spent: 75 min  Marlin Canary [facility] Pager [phone]

## 2013-06-05 NOTE — ED Provider Notes (Signed)
CSN: [id number]     Arrival date & time 06/05/13  7829 History   First MD Initiated Contact with Patient 06/05/13 0928     This chart was scribed for [facility] R. Rubin Payor, MD by Ellin Mayhew, ED Scribe. This patient was seen in room APA17/APA17 and the patient's care was started at 9:31 AM.   Chief Complaint  Patient presents with  . Sore Throat  . Otalgia   The history is provided by the patient. No language interpreter was used.   HPI Comments: Veronica [name] is a 32 y.o. female who presents to the Emergency Department complaining of sore throat diffusely, but mostly in the front, with difficulty swallowing and associated productive cough (yellow sputum). Patient also complains of ear pain and. Patient reports that the symptoms began four days ago; however, she was seen in the ED yesterday. Patient was prescribed NORCO with no improvement. She reports having had a low grade fever. She denies any possibility of being pregnant. She denies any allergies to medications.  History reviewed. No pertinent past medical history. Past Surgical History  Procedure Laterality Date  . Brain surgery     No family history on file. History  Substance Use Topics  . Smoking status: Former Games developer  . Smokeless tobacco: Not on file  . Alcohol Use: No   OB History   Grav Para Term Preterm Abortions TAB SAB Ect Mult Living                 Review of Systems  Constitutional: Positive for fever.  HENT: Positive for ear pain and sore throat.   Respiratory: Positive for cough.   Gastrointestinal: Positive for nausea.  All other systems reviewed and are negative.    Allergies  Review of patient's allergies indicates no known allergies.  Home Medications   Current Outpatient Rx  Name  Route  Sig  Dispense  Refill  . HYDROcodone-acetaminophen (NORCO) 5-325 MG per tablet   Oral   Take 2 tablets by mouth every 4 (four) hours as needed.   6 tablet   0    Triage Vitals: BP 142/80  Pulse 101   Temp(Src) 99.5 F (37.5 C) (Oral)  Resp 20  SpO2 98%  LMP 05/28/2013  Physical Exam  Nursing note and vitals reviewed. Constitutional: She is oriented to person, place, and time. She appears well-developed and well-nourished.  Appears unconfortable  HENT:  Head: Normocephalic and atraumatic.  Difficult to see posterior pharynx. Swelling to the peritonsillars (left greater than right). Difficult to view.  Eyes: EOM are normal. Pupils are equal, round, and reactive to light.  Cardiovascular: Normal rate, regular rhythm and normal heart sounds.   Pulmonary/Chest: Effort normal and breath sounds normal. No stridor.  Abdominal: Soft. Bowel sounds are normal.  Musculoskeletal: Normal range of motion. She exhibits no edema.  Lymphadenopathy:    She has cervical adenopathy (Anterior).  Neurological: She is alert and oriented to person, place, and time.    ED Course  Procedures (including critical care time) Labs Review  Results for orders placed during the hospital encounter of 06/05/13  CBC WITH DIFFERENTIAL      Result Value Range   WBC 17.0 (*) 4.0 - 10.5 K/uL   RBC 3.64 (*) 3.87 - 5.11 MIL/uL   Hemoglobin 11.0 (*) 12.0 - 15.0 g/dL   HCT 56.2 (*) 13.0 - 86.5 %   MCV 89.8  78.0 - 100.0 fL   MCH 30.2  26.0 - 34.0 pg  MCHC 33.6  30.0 - 36.0 g/dL   RDW 81.1  91.4 - 78.2 %   Platelets 217  150 - 400 K/uL   Neutrophils Relative % 88 (*) 43 - 77 %   Neutro Abs 15.0 (*) 1.7 - 7.7 K/uL   Lymphocytes Relative 5 (*) 12 - 46 %   Lymphs Abs 0.8  0.7 - 4.0 K/uL   Monocytes Relative 7  3 - 12 %   Monocytes Absolute 1.2 (*) 0.1 - 1.0 K/uL   Eosinophils Relative 0  0 - 5 %   Eosinophils Absolute 0.0  0.0 - 0.7 K/uL   Basophils Relative 0  0 - 1 %   Basophils Absolute 0.0  0.0 - 0.1 K/uL  BASIC METABOLIC PANEL      Result Value Range   Sodium 132 (*) 135 - 145 mEq/L   Potassium 3.2 (*) 3.5 - 5.1 mEq/L   Chloride 96  96 - 112 mEq/L   CO2 25  19 - 32 mEq/L   Glucose, Bld 117 (*) 70 -  99 mg/dL   BUN 7  6 - 23 mg/dL   Creatinine, Ser 9.56  0.50 - 1.10 mg/dL   Calcium 8.4  8.4 - 21.3 mg/dL   GFR calc non Af Amer >90  >90 mL/min   GFR calc Af Amer >90  >90 mL/min    DIAGNOSTIC STUDIES: Oxygen Saturation is 98% on room air, normal by my interpretation.    COORDINATION OF CARE: 9:32 AM-Discussed suspicion of pus collection and ordered anti-nausea and pain medication to get a closer look. Treatment plan discussed with patient and patient agrees.  10:23 AM-F/U with patient after anti-nausea and pain medicine given. Rechecked the pharynx and confirmed the L side is worse. Will order CT scan to assist with visibility. Will F/U with patient following imaging results. Treatment plan discussed with patient at bedside and patient agrees.   Labs Reviewed  CBC WITH DIFFERENTIAL - Abnormal; Notable for the following:    WBC 17.0 (*)    RBC 3.64 (*)    Hemoglobin 11.0 (*)    HCT 32.7 (*)    Neutrophils Relative % 88 (*)    Neutro Abs 15.0 (*)    Lymphocytes Relative 5 (*)    Monocytes Absolute 1.2 (*)    All other components within normal limits  BASIC METABOLIC PANEL - Abnormal; Notable for the following:    Sodium 132 (*)    Potassium 3.2 (*)    Glucose, Bld 117 (*)    All other components within normal limits    Imaging Review Dg Cervical Spine Complete  06/04/2013   CLINICAL DATA:  Sore throat; left-sided neck pain.  EXAM: CERVICAL SPINE  4+ VIEWS  COMPARISON:  Cervical spine radiographs performed 05/17/2008  FINDINGS: There is no evidence of fracture or subluxation. Vertebral bodies demonstrate normal height and alignment. Intervertebral disc spaces are preserved. Prevertebral soft tissues are within normal limits. The provided odontoid view demonstrates no significant abnormality.  The visualized lung apices are clear.  IMPRESSION: No evidence of fracture or subluxation along the cervical spine.   Electronically Signed   By: Roanna Raider M.D.   On: 06/04/2013 03:29    Ct Soft Tissue Neck W Contrast  06/05/2013   ADDENDUM REPORT: 06/05/2013 11:39  ADDENDUM: In the Impression: The airway is shifted to the right and remains patent. The sentence that states: Airway shifted to the right below the knee no remains patent , was inadvertently transcribed.  Electronically Signed   By: Salome Holmes M.D.   On: 06/05/2013 11:39   06/05/2013   CLINICAL DATA:  Esophageal, neck, and ear pain, difficulty swallowing  EXAM: CT NECK WITH CONTRAST  TECHNIQUE: Multidetector CT imaging of the neck was performed using the standard protocol following the bolus administration of intravenous contrast.  CONTRAST:  75mL OMNIPAQUE IOHEXOL 300 MG/ML  SOLN  COMPARISON:  None.  FINDINGS: Skull base is unremarkable.  There is diffuse enlargement of the tonsils on the left with a heterogeneous enhancement. No focal loculated fluid collections are identified. There is a mass effect upon the airway with shift to the right the airway is otherwise patent. There is obliteration of the parapharyngeal space on the left. An area of low attenuation projects within the prevertebral space, consistent with a component of prevertebral edema. The parapharyngeal space on the right is partially maintained.  Adenopathy is identified within the carotid chain lymph nodes largest on the left measuring 1.4 cm in short axis image 46 series 2. There is heterogeneous enhancement of the inferior aspect the salivary gland on the left. Prominent submandibular lymph nodes are identified.  The parotid glands are symmetric. The right submandibular gland is unremarkable. Opacified vascular structures unremarkable. There is no evidence of free fluid or loculated fluid collections within the neck. The glottic and laryngeal regions are unremarkable. Evaluation of thoracic inlet is unremarkable. The lung apices are unremarkable.  IMPRESSION: Findings consistent with tonsillitis without definitive loculated fluid collection. Phlegmonous  changes within the tonsils is a diagnostic consideration. These findings are appreciated on the left. Airway shifted to the right below-knee no remains patent. Adenopathy is appreciated within anterior and posterior cervical chain lymph nodes left greater than right. There findings which may represent a component of inflammatory changes within the salivary gland on the left.  Electronically Signed: By: Salome Holmes M.D. On: 06/05/2013 11:02    EKG Interpretation   None       MDM   1. Tonsillitis    Patient with worsening tonsillitis. Has had some phlegmon, but no clear abscess on CT. There is some shifting of the airway. Will admit patient. Discussed with hospitalist on call at [facility] and she requests transferred to [facility] cone for ENT evaluation will be available  I personally performed the services described in this documentation, which was scribed in my presence. The recorded information has been reviewed and is accurate.     Juliet Rude. Rubin Payor, MD 06/05/13 1413

## 2013-06-06 ENCOUNTER — Inpatient Hospital Stay (HOSPITAL_COMMUNITY): Payer: Medicaid Other | Admitting: Anesthesiology

## 2013-06-06 ENCOUNTER — Encounter (HOSPITAL_COMMUNITY): Payer: Medicaid Other | Admitting: Anesthesiology

## 2013-06-06 ENCOUNTER — Encounter (HOSPITAL_COMMUNITY): Payer: Self-pay | Admitting: Anesthesiology

## 2013-06-06 ENCOUNTER — Encounter (HOSPITAL_COMMUNITY)
Admission: EM | Disposition: A | Discharge status: Home / Self Care | Payer: Self-pay | Source: Home / Self Care | Attending: Internal Medicine

## 2013-06-06 DIAGNOSIS — E871 Hypo-osmolality and hyponatremia: Secondary | ICD-10-CM | POA: Diagnosis present

## 2013-06-06 HISTORY — PX: TONSILLECTOMY: SHX5217

## 2013-06-06 LAB — CBC
HCT: 31 % — ABNORMAL LOW (ref 36.0–46.0)
Hemoglobin: 10.5 g/dL — ABNORMAL LOW (ref 12.0–15.0)
MCHC: 33.9 g/dL (ref 30.0–36.0)
MCV: 90.9 fL (ref 78.0–100.0)
RBC: 3.41 MIL/uL — ABNORMAL LOW (ref 3.87–5.11)
WBC: 18.5 10*3/uL — ABNORMAL HIGH (ref 4.0–10.5)

## 2013-06-06 LAB — CULTURE, GROUP A STREP

## 2013-06-06 LAB — SURGICAL PCR SCREEN
MRSA, PCR: NEGATIVE
Staphylococcus aureus: NEGATIVE

## 2013-06-06 LAB — BASIC METABOLIC PANEL
BUN: 6 mg/dL (ref 6–23)
Chloride: 102 mEq/L (ref 96–112)
GFR calc Af Amer: >90 mL/min (ref >90)
GFR calc non Af Amer: >90 mL/min (ref >90)
Potassium: 3.6 mEq/L (ref 3.5–5.1)
Sodium: 137 mEq/L (ref 135–145)

## 2013-06-06 SURGERY — TONSILLECTOMY
Anesthesia: General | Laterality: Bilateral

## 2013-06-06 MED ORDER — MENTHOL 3 MG MT LOZG
1.0000 | LOZENGE | OROMUCOSAL | Status: DC | PRN
  Filled 2013-06-06: qty 9

## 2013-06-06 MED ORDER — SODIUM CHLORIDE 0.9 % IV SOLN
3.0000 g | Freq: Four times a day (QID) | INTRAVENOUS | Status: DC
  Administered 2013-06-06 – 2013-06-07 (×3): 3 g via INTRAVENOUS
  Filled 2013-06-06 (×6): qty 3

## 2013-06-06 MED ORDER — BACITRACIN ZINC 500 UNIT/GM EX OINT
TOPICAL_OINTMENT | CUTANEOUS | Status: AC
  Filled 2013-06-06: qty 15

## 2013-06-06 MED ORDER — METOCLOPRAMIDE HCL 5 MG/ML IJ SOLN
INTRAMUSCULAR | Status: AC
  Administered 2013-06-06: 10 mg via INTRAVENOUS
  Filled 2013-06-06: qty 2

## 2013-06-06 MED ORDER — OXYMETAZOLINE HCL 0.05 % NA SOLN
NASAL | Status: DC | PRN
  Administered 2013-06-06: 1 via NASAL

## 2013-06-06 MED ORDER — LIDOCAINE HCL (CARDIAC) 20 MG/ML IV SOLN
INTRAVENOUS | Status: DC | PRN
  Administered 2013-06-06: 100 mg via INTRAVENOUS

## 2013-06-06 MED ORDER — BIOGAIA PROBIOTIC PO LIQD: 5.0000 mL | Freq: Every day | ORAL | Status: DC

## 2013-06-06 MED ORDER — HYDROCODONE-ACETAMINOPHEN 7.5-325 MG/15ML PO SOLN
15.0000 mL | ORAL | Status: DC | PRN
  Administered 2013-06-06 – 2013-06-08 (×3): 15 mL via ORAL
  Filled 2013-06-06 (×4): qty 15

## 2013-06-06 MED ORDER — METOCLOPRAMIDE HCL 5 MG/ML IJ SOLN
10.0000 mg | Freq: Once | INTRAMUSCULAR | Status: AC | PRN
  Administered 2013-06-06: 10 mg via INTRAVENOUS

## 2013-06-06 MED ORDER — GLYCOPYRROLATE 0.2 MG/ML IJ SOLN
INTRAMUSCULAR | Status: DC | PRN
  Administered 2013-06-06: 0.2 mg via INTRAVENOUS

## 2013-06-06 MED ORDER — MIDAZOLAM HCL 5 MG/5ML IJ SOLN
INTRAMUSCULAR | Status: DC | PRN
  Administered 2013-06-06: 1 mg via INTRAVENOUS

## 2013-06-06 MED ORDER — LACTATED RINGERS IV SOLN
INTRAVENOUS | Status: DC | PRN
  Administered 2013-06-06 (×2): via INTRAVENOUS

## 2013-06-06 MED ORDER — FENTANYL CITRATE 0.05 MG/ML IJ SOLN
25.0000 ug | INTRAMUSCULAR | Status: DC | PRN
  Administered 2013-06-06 (×2): 25 ug via INTRAVENOUS

## 2013-06-06 MED ORDER — FENTANYL CITRATE 0.05 MG/ML IJ SOLN
INTRAMUSCULAR | Status: AC
  Administered 2013-06-06: 25 ug via INTRAVENOUS
  Filled 2013-06-06: qty 2

## 2013-06-06 MED ORDER — ACETAMINOPHEN 160 MG/5ML PO SOLN: 325.0000 mg | ORAL | Status: DC | PRN

## 2013-06-06 MED ORDER — AMINOCAPROIC ACID SOLUTION 5% (50 MG/ML)
5.0000 mL | ORAL | Status: DC
  Administered 2013-06-06 – 2013-06-07 (×23): 5 mL via ORAL
  Filled 2013-06-06 (×2): qty 100

## 2013-06-06 MED ORDER — MORPHINE SULFATE 2 MG/ML IJ SOLN
2.0000 mg | INTRAMUSCULAR | Status: DC | PRN
  Administered 2013-06-06 – 2013-06-08 (×4): 2 mg via INTRAVENOUS
  Filled 2013-06-06 (×4): qty 1

## 2013-06-06 MED ORDER — OXYMETAZOLINE HCL 0.05 % NA SOLN
NASAL | Status: AC
  Filled 2013-06-06: qty 15

## 2013-06-06 MED ORDER — DEXAMETHASONE SODIUM PHOSPHATE 10 MG/ML IJ SOLN
10.0000 mg | Freq: Three times a day (TID) | INTRAMUSCULAR | Status: AC
  Administered 2013-06-06 – 2013-06-08 (×6): 10 mg via INTRAVENOUS
  Filled 2013-06-06 (×6): qty 1

## 2013-06-06 MED ORDER — FENTANYL CITRATE 0.05 MG/ML IJ SOLN
INTRAMUSCULAR | Status: DC | PRN
  Administered 2013-06-06 (×2): 50 ug via INTRAVENOUS

## 2013-06-06 MED ORDER — LIDOCAINE-EPINEPHRINE 1 %-1:100000 IJ SOLN
INTRAMUSCULAR | Status: AC
  Filled 2013-06-06: qty 1

## 2013-06-06 MED ORDER — OXYCODONE HCL 5 MG/5ML PO SOLN: 5.0000 mg | Freq: Once | ORAL | Status: DC | PRN

## 2013-06-06 MED ORDER — LACTINEX PO CHEW
1.0000 | CHEWABLE_TABLET | Freq: Three times a day (TID) | ORAL | Status: DC
  Administered 2013-06-07 – 2013-06-08 (×5): 1 via ORAL
  Filled 2013-06-06 (×10): qty 1

## 2013-06-06 MED ORDER — PROPOFOL 10 MG/ML IV BOLUS
INTRAVENOUS | Status: DC | PRN
  Administered 2013-06-06: 200 mg via INTRAVENOUS
  Administered 2013-06-06: 30 mg via INTRAVENOUS

## 2013-06-06 MED ORDER — CLINDAMYCIN PHOSPHATE 600 MG/50ML IV SOLN
600.0000 mg | Freq: Four times a day (QID) | INTRAVENOUS | Status: DC
  Administered 2013-06-06 – 2013-06-08 (×8): 600 mg via INTRAVENOUS
  Filled 2013-06-06 (×12): qty 50

## 2013-06-06 MED ORDER — OXYCODONE HCL 5 MG PO TABS: 5.0000 mg | ORAL_TABLET | Freq: Once | ORAL | Status: DC | PRN

## 2013-06-06 SURGICAL SUPPLY — 68 items
AIRSTRIP 4 3/4X3 1/4 7185 (GAUZE/BANDAGES/DRESSINGS) IMPLANT
APL SKNCLS STERI-STRIP NONHPOA (GAUZE/BANDAGES/DRESSINGS)
ATTRACTOMAT 16X20 MAGNETIC DRP (DRAPES) IMPLANT
BANDAGE CONFORM 2  STR LF (GAUZE/BANDAGES/DRESSINGS) IMPLANT
BANDAGE GAUZE ELAST BULKY 4 IN (GAUZE/BANDAGES/DRESSINGS) IMPLANT
BENZOIN TINCTURE PRP APPL 2/3 (GAUZE/BANDAGES/DRESSINGS) IMPLANT
BLADE SURG 10 STRL SS (BLADE) IMPLANT
BLADE SURG 15 STRL LF DISP TIS (BLADE) IMPLANT
BLADE SURG 15 STRL SS (BLADE)
BLADE SURG ROTATE 9660 (MISCELLANEOUS) IMPLANT
CANISTER SUCTION 2500CC (MISCELLANEOUS) ×3 IMPLANT
CATH ROBINSON RED A/P 10FR (CATHETERS) IMPLANT
CATH ROBINSON RED A/P 16FR (CATHETERS) IMPLANT
CLEANER TIP ELECTROSURG 2X2 (MISCELLANEOUS) ×3 IMPLANT
CLOTH BEACON ORANGE TIMEOUT ST (SAFETY) ×1 IMPLANT
COAGULATOR SUCT SWTCH 10FR 6 (ELECTROSURGICAL) ×3 IMPLANT
CONT SPEC 4OZ CLIKSEAL STRL BL (MISCELLANEOUS) ×5 IMPLANT
COVER SURGICAL LIGHT HANDLE (MISCELLANEOUS) ×1 IMPLANT
CRADLE DONUT ADULT HEAD (MISCELLANEOUS) IMPLANT
DECANTER SPIKE VIAL GLASS SM (MISCELLANEOUS) ×3 IMPLANT
DRAIN PENROSE 1/4X12 LTX STRL (WOUND CARE) IMPLANT
DRAPE LAPAROTOMY TRNSV 102X78 (DRAPE) IMPLANT
DRSG EMULSION OIL 3X3 NADH (GAUZE/BANDAGES/DRESSINGS) IMPLANT
ELECT COATED BLADE 2.86 ST (ELECTRODE) ×3 IMPLANT
ELECT NDL TIP 2.8 STRL (NEEDLE) IMPLANT
ELECT NEEDLE TIP 2.8 STRL (NEEDLE) IMPLANT
ELECT REM PT RETURN 9FT ADLT (ELECTROSURGICAL) ×3
ELECT REM PT RETURN 9FT PED (ELECTROSURGICAL)
ELECTRODE REM PT RETRN 9FT PED (ELECTROSURGICAL) IMPLANT
ELECTRODE REM PT RTRN 9FT ADLT (ELECTROSURGICAL) ×2 IMPLANT
GAUZE SPONGE 4X4 16PLY XRAY LF (GAUZE/BANDAGES/DRESSINGS) ×1 IMPLANT
GLOVE BIO SURGEON STRL SZ7 (GLOVE) ×1 IMPLANT
GLOVE BIOGEL PI IND STRL 7.5 (GLOVE) ×2 IMPLANT
GLOVE BIOGEL PI INDICATOR 7.5 (GLOVE) ×1
GLOVE SURG SS PI 7.5 STRL IVOR (GLOVE) ×3 IMPLANT
GOWN STRL NON-REIN LRG LVL3 (GOWN DISPOSABLE) ×6 IMPLANT
KIT BASIN OR (CUSTOM PROCEDURE TRAY) ×3 IMPLANT
KIT ROOM TURNOVER OR (KITS) ×3 IMPLANT
MARKER SKIN DUAL TIP RULER LAB (MISCELLANEOUS) ×3 IMPLANT
NDL HYPO 25GX1X1/2 BEV (NEEDLE) ×1 IMPLANT
NEEDLE HYPO 25GX1X1/2 BEV (NEEDLE) IMPLANT
NS IRRIG 1000ML POUR BTL (IV SOLUTION) ×3 IMPLANT
PACK SURGICAL SETUP 50X90 (CUSTOM PROCEDURE TRAY) ×3 IMPLANT
PAD ARMBOARD 7.5X6 YLW CONV (MISCELLANEOUS) ×6 IMPLANT
PENCIL BUTTON HOLSTER BLD 10FT (ELECTRODE) ×3 IMPLANT
SPECIMEN JAR SMALL (MISCELLANEOUS) IMPLANT
SPONGE GAUZE 4X4 12PLY (GAUZE/BANDAGES/DRESSINGS) IMPLANT
SPONGE LAP 18X18 X RAY DECT (DISPOSABLE) ×1 IMPLANT
SPONGE TONSIL 1 RF SGL (DISPOSABLE) ×2 IMPLANT
STRIP CLOSURE SKIN 1/2X4 (GAUZE/BANDAGES/DRESSINGS) ×1 IMPLANT
SUCTION FRAZIER TIP 10 FR DISP (SUCTIONS) ×1 IMPLANT
SUT MNCRL AB 4-0 PS2 18 (SUTURE) ×1 IMPLANT
SUT VIC AB 2-0 SH 27 (SUTURE)
SUT VIC AB 2-0 SH 27X BRD (SUTURE) ×1 IMPLANT
SUT VIC AB 3-0 SH 27 (SUTURE)
SUT VIC AB 3-0 SH 27XBRD (SUTURE) ×1 IMPLANT
SWAB COLLECTION DEVICE MRSA (MISCELLANEOUS) IMPLANT
SYR BULB 3OZ (MISCELLANEOUS) ×1 IMPLANT
SYR BULB IRRIGATION 50ML (SYRINGE) ×1 IMPLANT
SYR CONTROL 10ML LL (SYRINGE) ×1 IMPLANT
TOWEL OR 17X24 6PK STRL BLUE (TOWEL DISPOSABLE) ×6 IMPLANT
TOWEL OR 17X26 10 PK STRL BLUE (TOWEL DISPOSABLE) ×3 IMPLANT
TRAY ENT MC OR (CUSTOM PROCEDURE TRAY) ×3 IMPLANT
TUBE ANAEROBIC SPECIMEN COL (MISCELLANEOUS) IMPLANT
TUBE CONNECTING 12X1/4 (SUCTIONS) ×1 IMPLANT
TUBE SALEM SUMP 12R W/ARV (TUBING) IMPLANT
WATER STERILE IRR 1000ML POUR (IV SOLUTION) ×1 IMPLANT
YANKAUER SUCT BULB TIP NO VENT (SUCTIONS) ×3 IMPLANT

## 2013-06-06 NOTE — Progress Notes (Addendum)
Subjective: POD#0 from bilateral tonsillectomy for left peritonsillar abscess with necrotizing acute tonsillitis  Objective: Vital signs in last 24 hours: Temp:  [98.4 F (36.9 C)-100.6 F (38.1 C)] 99 F (37.2 C) (12/13 1244) Pulse Rate:  [73-83] 80 (12/13 1244) Resp:  [16-23] 16 (12/13 1244) BP: (126-142)/(72-87) 142/87 mmHg (12/13 1244) SpO2:  [97 %-99 %] 98 % (12/13 1244) Weight:  [74.707 kg (164 lb 11.2 oz)] 74.707 kg (164 lb 11.2 oz) (12/12 1518)  Groggy from the anesthesia but awakens easily and follows commands/communicative. Oral cavity with tonsils surgically absent and cautery artefact/hemostatic. EOMI PERRLA, CN 2-12 intact and symmetric.  Doing well post-op, no bleeding, breathing well. 8:19 PM Melvenia Beam, MD  @LABLAST2 (wbc:2,hgb:2,hct:2,plt:2)  Recent Labs  06/05/13 1042 06/06/13 0425  NA 132* 137  K 3.2* 3.6  CL 96 102  CO2 25 25  GLUCOSE 117* 105*  BUN 7 6  CREATININE 0.63 0.55  CALCIUM 8.4 8.5    Medications:  Scheduled Meds: . [facility] HOLD] acetaminophen (TYLENOL) oral liquid 160 mg/5 mL  650 mg Oral Once  . aminocaproic acid  5 mL Oral Q1H  . [MAR HOLD] clindamycin (CLEOCIN) IV  600 mg Intravenous Q6H  . [MAR HOLD] dexamethasone  10 mg Intravenous Q8H  . fentaNYL      . metoCLOPramide       Continuous Infusions: . 0.9 % NaCl with KCl 20 mEq / L 125 mL/hr at 06/06/13 0115   PRN Meds:.acetaminophen (TYLENOL) oral liquid 160 mg/5 mL, fentaNYL, [MAR HOLD] HYDROcodone-acetaminophen, metoCLOPramide, morphine injection, [MAR HOLD] ondansetron (ZOFRAN) IV, [MAR HOLD] ondansetron, oxyCODONE, oxyCODONE  Assessment/Plan: S/p tonsillectomy for left peritonsillar abscess and acute necrotizing tonsillitis. For now clear liquids only and do not advance. Will consider advancing diet  if improves clinically. Will follow cultures, continue IV clindamycin and IV decadron for now. Once taking sufficient PO and clinically improved can likely go home on PO  hydrocodone/acetaminophen liquid, prednisolone taper, PRN zofran, and oral clindamycin liquid with post-tonsillectomy diet. Will monitor.   LOS: 1 day   Melvenia Beam 06/06/2013, 1:07 PM

## 2013-06-06 NOTE — Anesthesia Procedure Notes (Signed)
Procedure Name: Intubation Date/Time: 06/06/2013 11:33 AM Performed by: Coralee Rud Pre-anesthesia Checklist: Patient identified, Emergency Drugs available, Suction available and Patient being monitored Patient Re-evaluated:Patient Re-evaluated prior to inductionOxygen Delivery Method: Circle system utilized Preoxygenation: Pre-oxygenation with 100% oxygen Intubation Type: IV induction Ventilation: Mask ventilation without difficulty Laryngoscope size: Elective Glidescope. Grade View: Grade I Tube type: Oral Tube size: 7.0 mm Number of attempts: 1 Airway Equipment and Method: Stylet Placement Confirmation: ETT inserted through vocal cords under direct vision,  positive ETCO2 and breath sounds checked- equal and bilateral Secured at: 22 cm Dental Injury: Teeth and Oropharynx as per pre-operative assessment

## 2013-06-06 NOTE — Anesthesia Postprocedure Evaluation (Signed)
  Anesthesia Post-op Note  Patient: Veronica [name]  Procedure(s) Performed: Procedure(s): TONSILLECTOMY (Bilateral)  Patient Location: PACU  Anesthesia Type:General  Level of Consciousness: awake and alert   Airway and Oxygen Therapy: Patient Spontanous Breathing and Patient connected to nasal cannula oxygen  Post-op Pain: none  Post-op Assessment: Post-op Vital signs reviewed, Patient's Cardiovascular Status Stable, Respiratory Function Stable, Patent Airway, No signs of Nausea or vomiting and Pain level controlled  Post-op Vital Signs: Reviewed and stable  Complications: No apparent anesthesia complications Patient retains nasal airway. Will be watched in step down unit.

## 2013-06-06 NOTE — Anesthesia Preprocedure Evaluation (Signed)
Anesthesia Evaluation  Patient identified by MRN, date of birth, ID band Patient awake    Reviewed: Allergy & Precautions, H&P , NPO status , Patient's Chart, lab work & pertinent test results, reviewed documented beta blocker date and time   Airway Mallampati: II TM Distance: >3 FB Neck ROM: full    Dental   Pulmonary former smoker,  breath sounds clear to auscultation        Cardiovascular negative cardio ROS  Rhythm:regular     Neuro/Psych negative neurological ROS  negative psych ROS   GI/Hepatic negative GI ROS, Neg liver ROS,   Endo/Other  negative endocrine ROS  Renal/GU negative Renal ROS  negative genitourinary   Musculoskeletal   Abdominal   Peds  Hematology negative hematology ROS (+)   Anesthesia Other Findings See surgeon's H&P   Reproductive/Obstetrics negative OB ROS                           Anesthesia Physical Anesthesia Plan  ASA: II and emergent  Anesthesia Plan: General   Post-op Pain Management:    Induction: Intravenous  Airway Management Planned: Oral ETT  Additional Equipment:   Intra-op Plan:   Post-operative Plan: Extubation in OR  Informed Consent: I have reviewed the patients History and Physical, chart, labs and discussed the procedure including the risks, benefits and alternatives for the proposed anesthesia with the patient or authorized representative who has indicated his/her understanding and acceptance.   Dental Advisory Given  Plan Discussed with: CRNA and Surgeon  Anesthesia Plan Comments:         Anesthesia Quick Evaluation

## 2013-06-06 NOTE — Transfer of Care (Signed)
Immediate Anesthesia Transfer of Care Note  Patient: Veronica [name]  Procedure(s) Performed: Procedure(s): TONSILLECTOMY (Bilateral)  Patient Location: PACU  Anesthesia Type:General  Level of Consciousness: awake, alert , oriented and patient cooperative  Airway & Oxygen Therapy: Patient Spontanous Breathing and Patient connected to face mask oxygen  Post-op Assessment: Report given to PACU RN, Post -op Vital signs reviewed and stable and Patient moving all extremities  Post vital signs: Reviewed and stable  Complications: No apparent anesthesia complications

## 2013-06-06 NOTE — Progress Notes (Signed)
ANTIBIOTIC CONSULT NOTE - INITIAL  Pharmacy Consult for Unasyn Indication: Necrotizing acute tonsillitis  No Known Allergies  Patient Measurements: Height: 5\' 6"  (167.6 cm) Weight: 164 lb 11.2 oz (74.707 kg) IBW/kg (Calculated) : 59.3 Adjusted Body Weight: n/a  Vital Signs: Temp: 97.8 F (36.6 C) (12/13 1500) Temp src: Oral (12/13 0441) BP: 146/78 mmHg (12/13 1511) Pulse Rate: 64 (12/13 1511) Intake/Output from previous day: 12/12 0701 - 12/13 0700 In: 758.3 [I.V.:758.3] Out: -  Intake/Output from this shift: Total I/O In: 1500 [I.V.:1500] Out: -   Labs:  Recent Labs  06/05/13 1042 06/06/13 0425  WBC 17.0* 18.5*  HGB 11.0* 10.5*  PLT 217 213  CREATININE 0.63 0.55   Estimated Creatinine Clearance: 106.3 ml/min (by C-G formula based on Cr of 0.55). No results found for this basename: VANCOTROUGH, Leodis Binet, VANCORANDOM, GENTTROUGH, GENTPEAK, GENTRANDOM, TOBRATROUGH, TOBRAPEAK, TOBRARND, AMIKACINPEAK, AMIKACINTROU, AMIKACIN,  in the last 72 hours   Microbiology: Recent Results (from the past 720 hour(s))  RAPID STREP SCREEN     Status: None   Collection Time    06/04/13  2:52 AM      Result Value Range Status   Streptococcus, Group A Screen (Direct) NEGATIVE  NEGATIVE Final   Comment: (NOTE)     A Rapid Antigen test may result negative if the antigen level in the     sample is below the detection level of this test. The FDA has not     cleared this test as a stand-alone test therefore the rapid antigen     negative result has reflexed to a Group A Strep culture.  CULTURE, GROUP A STREP     Status: None   Collection Time    06/04/13  2:52 AM      Result Value Range Status   Specimen Description THROAT   Final   Special Requests NONE   Final   Culture     Final   Value: STREPTOCOCCUS,BETA HEMOLYTIC NOT GROUP A     Performed at [facility]   Report Status 06/06/2013 FINAL   Final  SURGICAL PCR SCREEN     Status: None   Collection Time    06/06/13  10:18 AM      Result Value Range Status   MRSA, PCR NEGATIVE  NEGATIVE Final   Staphylococcus aureus NEGATIVE  NEGATIVE Final   Comment:            The Xpert SA Assay (FDA     approved for NASAL specimens     in patients over 57 years of age),     is one component of     a comprehensive surveillance     program.  Test performance has     been validated by [employer] for patients greater     than or equal to 24 year old.     It is not intended     to diagnose infection nor to     guide or monitor treatment.    Medical History: Past Medical History  Diagnosis Date  . Tonsillitis 06/05/2013    Medications:  Scheduled:  . acetaminophen (TYLENOL) oral liquid 160 mg/5 mL  650 mg Oral Once  . aminocaproic acid  5 mL Oral Q1H  . clindamycin (CLEOCIN) IV  600 mg Intravenous Q6H  . dexamethasone  10 mg Intravenous Q8H   Assessment: 32 yo female s/p tonsillectomy for bilateral, severe necrotizing tonsillitis.  CT scan with airway displacement.  Currently  receiving IV clindamycin, and pharmacy asked to add Unasyn post-op.  Scr 0.55, CrCl > 100.  WBC 18.5, Tm 100.6  Goal of Therapy:  Resolution of infection  Plan:  1. Unasyn 3g IV q 6 hrs. 2. Will f/u cultures and renal function.  Tad Moore, BCPS  Clinical Pharmacist Pager [phone]  06/06/2013 3:22 PM

## 2013-06-06 NOTE — Progress Notes (Signed)
[facility] PROGRESS NOTE  Veronica [name] QIO:[id number] DOB: 02-Dec-1980 DOA: 06/05/2013 PCP: No PCP Per Patient  HPI/Subjective: Having difficulty with speaking, denies feeling of choking or shortness of breath  Assessment/Plan: Active Problems:   Tonsillitis   Leukocytosis, unspecified   Hypokalemia   Tonsillitis, acute suppurative -Bilateral, severe with left worse than right, CT scan showed airway displacement. -There is no evidence of phlegmon/abscess. -Consulted ENT Dr. Emeline Darling, appreciate his help. -Patient to the OR today. Dr. Emeline Darling please advise if steroids is indicated.  Leukocytosis. -Secondary to tonsillitis, patient is on IV antibiotics monitor closely.  Hyponatremia -Likely secondary to dehydration, corrected with IV normal saline.  Hypokalemia -Corrected with IV potassium.  Code Status: Full code Family Communication: Plan discussed with the patient. Disposition Plan: Remains inpatient   Consultants:  ENT  Procedures:  Going for bilateral tonsillectomy and abscess drainage.  Antibiotics:  Rocephin we'll switch back to Unasyn.   Objective: Filed Vitals:   06/06/13 0441  BP: 126/72  Pulse: 83  Temp: 98.4 F (36.9 C)  Resp: 20    Intake/Output Summary (Last 24 hours) at 06/06/13 1248 Last data filed at 06/06/13 1130  Gross per 24 hour  Intake 1758.33 ml  Output      0 ml  Net 1758.33 ml   Filed Weights   06/05/13 1518  Weight: 74.707 kg (164 lb 11.2 oz)    Exam: General: Alert and awake, oriented x3, not in any acute distress. HEENT: anicteric sclera, pupils reactive to light and accommodation, EOMI CVS: S1-S2 clear, no murmur rubs or gallops Chest: clear to auscultation bilaterally, no wheezing, rales or rhonchi Abdomen: soft nontender, nondistended, normal bowel sounds, no organomegaly Extremities: no cyanosis, clubbing or edema noted bilaterally Neuro: Cranial nerves II-XII intact, no focal neurological deficits  Data  Reviewed: Basic Metabolic Panel:  Recent Labs Lab 06/05/13 1042 06/06/13 0425  NA 132* 137  K 3.2* 3.6  CL 96 102  CO2 25 25  GLUCOSE 117* 105*  BUN 7 6  CREATININE 0.63 0.55  CALCIUM 8.4 8.5   Liver Function Tests: No results found for this basename: AST, ALT, ALKPHOS, BILITOT, PROT, ALBUMIN,  in the last 168 hours No results found for this basename: LIPASE, AMYLASE,  in the last 168 hours No results found for this basename: AMMONIA,  in the last 168 hours CBC:  Recent Labs Lab 06/05/13 1042 06/06/13 0425  WBC 17.0* 18.5*  NEUTROABS 15.0*  --   HGB 11.0* 10.5*  HCT 32.7* 31.0*  MCV 89.8 90.9  PLT 217 213   Cardiac Enzymes: No results found for this basename: CKTOTAL, CKMB, CKMBINDEX, TROPONINI,  in the last 168 hours BNP (last 3 results) No results found for this basename: PROBNP,  in the last 8760 hours CBG: No results found for this basename: GLUCAP,  in the last 168 hours  Micro Recent Results (from the past 240 hour(s))  RAPID STREP SCREEN     Status: None   Collection Time    06/04/13  2:52 AM      Result Value Range Status   Streptococcus, Group A Screen (Direct) NEGATIVE  NEGATIVE Final   Comment: (NOTE)     A Rapid Antigen test may result negative if the antigen level in the     sample is below the detection level of this test. The FDA has not     cleared this test as a stand-alone test therefore the rapid antigen     negative result has reflexed  to a Group A Strep culture.  CULTURE, GROUP A STREP     Status: None   Collection Time    06/04/13  2:52 AM      Result Value Range Status   Specimen Description THROAT   Final   Special Requests NONE   Final   Culture     Final   Value: Culture reincubated for better growth     Performed at [facility]   Report Status PENDING   Incomplete  SURGICAL PCR SCREEN     Status: None   Collection Time    06/06/13 10:18 AM      Result Value Range Status   MRSA, PCR NEGATIVE  NEGATIVE Final    Staphylococcus aureus NEGATIVE  NEGATIVE Final   Comment:            The Xpert SA Assay (FDA     approved for NASAL specimens     in patients over 46 years of age),     is one component of     a comprehensive surveillance     program.  Test performance has     been validated by [employer] for patients greater     than or equal to 70 year old.     It is not intended     to diagnose infection nor to     guide or monitor treatment.     Studies: Ct Soft Tissue Neck W Contrast  06/05/2013   ADDENDUM REPORT: 06/05/2013 11:39  ADDENDUM: In the Impression: The airway is shifted to the right and remains patent. The sentence that states: Airway shifted to the right below the knee no remains patent , was inadvertently transcribed.   Electronically Signed   By: Salome Holmes M.D.   On: 06/05/2013 11:39   06/05/2013   CLINICAL DATA:  Esophageal, neck, and ear pain, difficulty swallowing  EXAM: CT NECK WITH CONTRAST  TECHNIQUE: Multidetector CT imaging of the neck was performed using the standard protocol following the bolus administration of intravenous contrast.  CONTRAST:  75mL OMNIPAQUE IOHEXOL 300 MG/ML  SOLN  COMPARISON:  None.  FINDINGS: Skull base is unremarkable.  There is diffuse enlargement of the tonsils on the left with a heterogeneous enhancement. No focal loculated fluid collections are identified. There is a mass effect upon the airway with shift to the right the airway is otherwise patent. There is obliteration of the parapharyngeal space on the left. An area of low attenuation projects within the prevertebral space, consistent with a component of prevertebral edema. The parapharyngeal space on the right is partially maintained.  Adenopathy is identified within the carotid chain lymph nodes largest on the left measuring 1.4 cm in short axis image 46 series 2. There is heterogeneous enhancement of the inferior aspect the salivary gland on the left. Prominent submandibular lymph nodes are  identified.  The parotid glands are symmetric. The right submandibular gland is unremarkable. Opacified vascular structures unremarkable. There is no evidence of free fluid or loculated fluid collections within the neck. The glottic and laryngeal regions are unremarkable. Evaluation of thoracic inlet is unremarkable. The lung apices are unremarkable.  IMPRESSION: Findings consistent with tonsillitis without definitive loculated fluid collection. Phlegmonous changes within the tonsils is a diagnostic consideration. These findings are appreciated on the left. Airway shifted to the right below-knee no remains patent. Adenopathy is appreciated within anterior and posterior cervical chain lymph nodes left greater than right. There findings which  may represent a component of inflammatory changes within the salivary gland on the left.  Electronically Signed: By: Salome Holmes M.D. On: 06/05/2013 11:02    Scheduled Meds: . [facility] HOLD] acetaminophen (TYLENOL) oral liquid 160 mg/5 mL  650 mg Oral Once  . [MAR HOLD] clindamycin (CLEOCIN) IV  600 mg Intravenous Q6H  . [MAR HOLD] dexamethasone  10 mg Intravenous Q8H   Continuous Infusions: . 0.9 % NaCl with KCl 20 mEq / L 125 mL/hr at 06/06/13 0115       Time spent: 35 minutes    [facility] A  [facility] Pager [phone] If 7PM-7AM, please contact night-coverage at [email or web address], password [facility] 06/06/2013, 12:48 PM  LOS: 1 day

## 2013-06-06 NOTE — H&P (Signed)
06/06/2013  Veronica [name]  PREOPERATIVE HISTORY AND PHYSICAL/Consult NOTE  CHIEF COMPLAINT: left peritonsillar abscess, reactive lymphadenopathy, severe acute tonsillitis  HISTORY: This is a 32 year old who presented to [name] and then hospitalist service with 3-4 days of dysphagia, odynophagia, L>R sore throat and tonsillar swelling with CT showing left peritonsillar abscess, reactive lymphadenopathy, severe acute tonsillitis.  She  now presents for bilateral tonsillectomy with possible I&D left neck abscess.  Dr. Emeline Darling, [place] has discussed the risks (bleeding, nerve injury, airway swelling, infection, risks of general anesthesia), benefits, and alternatives of this procedure. The patient understands the risks and would like to proceed with the procedure. The chances of success of the procedure are >50% and the patient understands this. I personally performed an examination of the patient within 24 hours of the procedure.  PAST MEDICAL HISTORY: Past Medical History  Diagnosis Date  . Tonsillitis 06/05/2013    PAST SURGICAL HISTORY: Past Surgical History  Procedure Laterality Date  . Brain surgery      MEDICATIONS: No current facility-administered medications on file prior to encounter.   Current Outpatient Prescriptions on File Prior to Encounter  Medication Sig Dispense Refill  . HYDROcodone-acetaminophen (NORCO) 5-325 MG per tablet Take 2 tablets by mouth every 4 (four) hours as needed.  6 tablet  0    ALLERGIES: No Known Allergies   SOCIAL HISTORY: History   Social History  . Marital Status: Single    Spouse Name: N/A    Number of Children: N/A  . Years of Education: N/A   Occupational History  . Not on file.   Social History Main Topics  . Smoking status: Former Games developer  . Smokeless tobacco: Never Used     Comment: ' QUIT SMOKING ALONG TIME AGO "  . Alcohol Use: No  . Drug Use: No  . Sexual Activity: Not on file   Other Topics Concern  . Not on file    Social History Narrative  . No narrative on file    FAMILY HISTORY:History reviewed. No pertinent family history.  REVIEW OF SYSTEMS:  HEENT: dysphagia, odynophagia, fevers, sore throat, neck pain, neck swelling. Otherwise negative x 12 systems except per HPI  PHYSICAL EXAM:  GENERAL:  NAD VITAL SIGNS:   Filed Vitals:   06/06/13 0441  BP: 126/72  Pulse: 83  Temp: 98.4 F (36.9 C)  Resp: 20  SKIN:  Warm, dry HEENT:  mallampatti 3 with left peritonsillar erythema and edema, hot potato voice NECK:  Reactive left adenopathy LYMPH:  Reactive left adenopathy LUNGS:  Grossly clear CARDIOVASCULAR:  RRR ABDOMEN:  soft MUSCULOSKELETAL: normal strength PSYCH:  Normal affect NEUROLOGIC:  CN 2-12 intact and symmetric  DIAGNOSTIC STUDIES:CT neck shows large left peritonsillar phlegmon/abscess with markedly enlarged left level II node and L>R tonsillar hypertrophy/edema with parapharyngeal edema.  ASSESSMENT AND PLAN: Plan to proceed with bilateral tonsillectomy, possible I&D left neck abscess/FNA left neck mass. Patient understands the risks, benefits, and alternatives. Informed written consent on chart. 06/06/2013  10:09 AM Veronica [name]

## 2013-06-06 NOTE — Op Note (Signed)
DATE OF OPERATION: 06/06/2013 Surgeon: Melvenia Beam Procedure Performed: [id number] bilateral tonsillectomy  PREOPERATIVE DIAGNOSIS: tonsillar hypertrophy with left peritonsillar abscess and acute necrotizing tonsillitis POSTOPERATIVE DIAGNOSIS: tonsillar hypertrophy with left peritonsillar abscess and acute necrotizing tonsillitis SURGEON: Melvenia Beam ANESTHESIA: General endotracheal.  ESTIMATED BLOOD LOSS: less than 50 mL.  DRAINS: none SPECIMENS: bilateral tonsils and left peritonsillar abscess culture INDICATIONS: The patient is a 32yo with a history of tonsillar hypertrophy with left peritonsillar abscess and acute necrotizing tonsillitis  DESCRIPTION OF OPERATION: The patient was brought to the operating room and was placed in the supine position and was placed under general endotracheal anesthesia by anesthesiology. The bed was turned 90 degrees and the Crowe-Davis mouth retractor was placed over the endotracheal tube and suspended from the Mayo stand. The palate was inspected and palpated and noted to be intact with no submucous cleft but the left anterior tonsillar pillar was edematous and erythematous consistent with a left peritonsillar abscess and the uvula was mildly edematous and deviated to the right.  Next the right tonsil was grasped with a curved Allis clamp and dissected from the right tonsillar fossa using the Bovie. Meticulous hemostasis was then achieved. The left tonsil was then grasped with the curved Allis and dissected from the left tonsillar fossa using the Bovie. The left tonsil was necrotizing and fell apart during dissection necessitating removal in several pieces. There was a large left peritonsillar abscess that was cultured, widely opened, suctioned out and drained. Meticulous hemostasis was achieved bilaterally. The nasal cavity and oropharynx were irrigated out and then the the nose, oral cavity,  and stomach were suctioned out. The Crowe-Davis and red rubber  catheter were removed and I confirmed that the patient was in class I occlusion and the temporomandibular joints were well-seated and well-aligned bilaterally. The patient was turned back to anesthesia and awakened from anesthesia and extubated without difficulty. The patient tolerated the procedure well with no immediate complications and was taken to the postoperative recovery area in good condition.   Dr. Melvenia Beam was present and performed the entire procedure. 06/06/2013  12:42 PM Melvenia Beam

## 2013-06-07 DIAGNOSIS — J36 Peritonsillar abscess: Secondary | ICD-10-CM

## 2013-06-07 HISTORY — DX: Peritonsillar abscess: J36

## 2013-06-07 LAB — BASIC METABOLIC PANEL
CO2: 25 mEq/L (ref 19–32)
Calcium: 8.6 mg/dL (ref 8.4–10.5)
Chloride: 104 mEq/L (ref 96–112)
Creatinine, Ser: 0.5 mg/dL (ref 0.50–1.10)
GFR calc Af Amer: >90 mL/min (ref >90)
Glucose, Bld: 125 mg/dL — ABNORMAL HIGH (ref 70–99)
Sodium: 138 mEq/L (ref 135–145)

## 2013-06-07 LAB — CBC
HCT: 29.8 % — ABNORMAL LOW (ref 36.0–46.0)
Hemoglobin: 10.1 g/dL — ABNORMAL LOW (ref 12.0–15.0)
MCH: 30.4 pg (ref 26.0–34.0)
MCV: 89.8 fL (ref 78.0–100.0)
Platelets: 196 10*3/uL (ref 150–400)
RDW: 13.1 % (ref 11.5–15.5)
WBC: 17.7 10*3/uL — ABNORMAL HIGH (ref 4.0–10.5)

## 2013-06-07 MED ORDER — AMINOCAPROIC ACID SOLUTION 5% (50 MG/ML)
5.0000 mL | ORAL | Status: DC | PRN
  Filled 2013-06-07: qty 100

## 2013-06-07 MED ORDER — WHITE PETROLATUM GEL
Status: AC
  Administered 2013-06-07: 0.2
  Filled 2013-06-07: qty 5

## 2013-06-07 MED ORDER — AMINOCAPROIC ACID SOLUTION 5% (50 MG/ML)
5.0000 mL | Freq: Four times a day (QID) | ORAL | Status: DC
  Administered 2013-06-07 – 2013-06-08 (×2): 5 mL via ORAL
  Filled 2013-06-07 (×3): qty 100

## 2013-06-07 NOTE — Progress Notes (Signed)
[facility] PROGRESS NOTE  Veronica [name] AVW:[id number] DOB: 1980/12/02 DOA: 06/05/2013 PCP: No PCP Per Patient  HPI/Subjective: Feeling much better, seen this morning sitting at bedside fine she breakfast. Denies any problems with speech or breathing.  Assessment/Plan: Active Problems:   Tonsillitis   Leukocytosis, unspecified   Hypokalemia   Hyponatremia   Tonsillitis, acute necrotizing -Bilateral, severe with left worse than right, CT scan showed airway displacement. -There is no evidence of phlegmon/abscess. -Consulted ENT Dr. Emeline Darling, appreciate his help. -Status post bilateral tonsillectomy and left peritonsillar drainage. -Continue antibiotics and Decadron, currently no respiratory compromise. -Transfer to 5 W. if it's okay with ENT, keep overnight likely to be discharged in the morning.  Leukocytosis. -Secondary to tonsillitis, patient is on IV antibiotics monitor closely.  Hyponatremia -Likely secondary to dehydration, corrected with IV normal saline.  Hypokalemia -Corrected with IV potassium.  Code Status: Full code Family Communication: Plan discussed with the patient. Disposition Plan: Remains inpatient   Consultants:  ENT  Procedures:  S/p bilateral tonsillectomy and abscess drainage.  Antibiotics:  Clindamycin, we will discontinue Unasyn (ENT Rec)   Objective: Filed Vitals:   06/07/13 0752  BP:   Pulse:   Temp: 98.5 F (36.9 C)  Resp:     Intake/Output Summary (Last 24 hours) at 06/07/13 0914 Last data filed at 06/07/13 0753  Gross per 24 hour  Intake 3377.08 ml  Output   2250 ml  Net 1127.08 ml   Filed Weights   06/05/13 1518  Weight: 74.707 kg (164 lb 11.2 oz)    Exam: General: Alert and awake, oriented x3, not in any acute distress. HEENT: anicteric sclera, pupils reactive to light and accommodation, EOMI CVS: S1-S2 clear, no murmur rubs or gallops Chest: clear to auscultation bilaterally, no wheezing, rales or  rhonchi Abdomen: soft nontender, nondistended, normal bowel sounds, no organomegaly Extremities: no cyanosis, clubbing or edema noted bilaterally Neuro: Cranial nerves II-XII intact, no focal neurological deficits  Data Reviewed: Basic Metabolic Panel:  Recent Labs Lab 06/05/13 1042 06/06/13 0425 06/07/13 0440  NA 132* 137 138  K 3.2* 3.6 4.1  CL 96 102 104  CO2 25 25 25   GLUCOSE 117* 105* 125*  BUN 7 6 8   CREATININE 0.63 0.55 0.50  CALCIUM 8.4 8.5 8.6   Liver Function Tests: No results found for this basename: AST, ALT, ALKPHOS, BILITOT, PROT, ALBUMIN,  in the last 168 hours No results found for this basename: LIPASE, AMYLASE,  in the last 168 hours No results found for this basename: AMMONIA,  in the last 168 hours CBC:  Recent Labs Lab 06/05/13 1042 06/06/13 0425 06/07/13 0440  WBC 17.0* 18.5* 17.7*  NEUTROABS 15.0*  --   --   HGB 11.0* 10.5* 10.1*  HCT 32.7* 31.0* 29.8*  MCV 89.8 90.9 89.8  PLT 217 213 196   Cardiac Enzymes: No results found for this basename: CKTOTAL, CKMB, CKMBINDEX, TROPONINI,  in the last 168 hours BNP (last 3 results) No results found for this basename: PROBNP,  in the last 8760 hours CBG:  Recent Labs Lab 06/07/13 0747  GLUCAP 120*    Micro Recent Results (from the past 240 hour(s))  RAPID STREP SCREEN     Status: None   Collection Time    06/04/13  2:52 AM      Result Value Range Status   Streptococcus, Group A Screen (Direct) NEGATIVE  NEGATIVE Final   Comment: (NOTE)     A Rapid Antigen test may result negative  if the antigen level in the     sample is below the detection level of this test. The FDA has not     cleared this test as a stand-alone test therefore the rapid antigen     negative result has reflexed to a Group A Strep culture.  CULTURE, GROUP A STREP     Status: None   Collection Time    06/04/13  2:52 AM      Result Value Range Status   Specimen Description THROAT   Final   Special Requests NONE   Final    Culture     Final   Value: STREPTOCOCCUS,BETA HEMOLYTIC NOT GROUP A     Performed at [facility]   Report Status 06/06/2013 FINAL   Final  SURGICAL PCR SCREEN     Status: None   Collection Time    06/06/13 10:18 AM      Result Value Range Status   MRSA, PCR NEGATIVE  NEGATIVE Final   Staphylococcus aureus NEGATIVE  NEGATIVE Final   Comment:            The Xpert SA Assay (FDA     approved for NASAL specimens     in patients over 31 years of age),     is one component of     a comprehensive surveillance     program.  Test performance has     been validated by [employer] for patients greater     than or equal to 84 year old.     It is not intended     to diagnose infection nor to     guide or monitor treatment.  CULTURE, ROUTINE-ABSCESS     Status: None   Collection Time    06/06/13 11:53 AM      Result Value Range Status   Specimen Description ABSCESS   Final   Special Requests LEFT PERITONSILLAR   Final   Gram Stain PENDING   Incomplete   Culture     Final   Value: NO GROWTH 1 DAY     Performed at [facility]   Report Status PENDING   Incomplete  MRSA PCR SCREENING     Status: None   Collection Time    06/06/13  3:29 PM      Result Value Range Status   MRSA by PCR NEGATIVE  NEGATIVE Final   Comment:            The GeneXpert MRSA Assay (FDA     approved for NASAL specimens     only), is one component of a     comprehensive MRSA colonization     surveillance program. It is not     intended to diagnose MRSA     infection nor to guide or     monitor treatment for     MRSA infections.     Studies: Ct Soft Tissue Neck W Contrast  06/05/2013   ADDENDUM REPORT: 06/05/2013 11:39  ADDENDUM: In the Impression: The airway is shifted to the right and remains patent. The sentence that states: Airway shifted to the right below the knee no remains patent , was inadvertently transcribed.   Electronically Signed   By: Salome Holmes M.D.   On: 06/05/2013  11:39   06/05/2013   CLINICAL DATA:  Esophageal, neck, and ear pain, difficulty swallowing  EXAM: CT NECK WITH CONTRAST  TECHNIQUE: Multidetector CT imaging of the neck was  performed using the standard protocol following the bolus administration of intravenous contrast.  CONTRAST:  75mL OMNIPAQUE IOHEXOL 300 MG/ML  SOLN  COMPARISON:  None.  FINDINGS: Skull base is unremarkable.  There is diffuse enlargement of the tonsils on the left with a heterogeneous enhancement. No focal loculated fluid collections are identified. There is a mass effect upon the airway with shift to the right the airway is otherwise patent. There is obliteration of the parapharyngeal space on the left. An area of low attenuation projects within the prevertebral space, consistent with a component of prevertebral edema. The parapharyngeal space on the right is partially maintained.  Adenopathy is identified within the carotid chain lymph nodes largest on the left measuring 1.4 cm in short axis image 46 series 2. There is heterogeneous enhancement of the inferior aspect the salivary gland on the left. Prominent submandibular lymph nodes are identified.  The parotid glands are symmetric. The right submandibular gland is unremarkable. Opacified vascular structures unremarkable. There is no evidence of free fluid or loculated fluid collections within the neck. The glottic and laryngeal regions are unremarkable. Evaluation of thoracic inlet is unremarkable. The lung apices are unremarkable.  IMPRESSION: Findings consistent with tonsillitis without definitive loculated fluid collection. Phlegmonous changes within the tonsils is a diagnostic consideration. These findings are appreciated on the left. Airway shifted to the right below-knee no remains patent. Adenopathy is appreciated within anterior and posterior cervical chain lymph nodes left greater than right. There findings which may represent a component of inflammatory changes within the salivary  gland on the left.  Electronically Signed: By: Salome Holmes M.D. On: 06/05/2013 11:02    Scheduled Meds: . acetaminophen (TYLENOL) oral liquid 160 mg/5 mL  650 mg Oral Once  . aminocaproic acid  5 mL Oral Q1H  . ampicillin-sulbactam (UNASYN) IV  3 g Intravenous Q6H  . clindamycin (CLEOCIN) IV  600 mg Intravenous Q6H  . dexamethasone  10 mg Intravenous Q8H  . lactobacillus acidophilus & bulgar  1 tablet Oral TID WC   Continuous Infusions: . 0.9 % NaCl with KCl 20 mEq / L 1,000 mL (06/07/13 0355)       Time spent: 35 minutes    [facility] A  [facility] Pager [phone] If 7PM-7AM, please contact night-coverage at [email or web address], password [facility] 06/07/2013, 9:14 AM  LOS: 2 days
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
## 2013-06-07 NOTE — Progress Notes (Addendum)
POD#1 from tonsillectomy for large left peritonsillar abscess/acute necrotizing tonsillitis. Patient took of PO, feeling much better, states she can't eat the dysphagia 3 food but is drinking plenty of fluids. Patient transferred to floor.   Scheduled Meds: . acetaminophen (TYLENOL) oral liquid 160 mg/5 mL  650 mg Oral Once  . aminocaproic acid  5 mL Oral QID  . clindamycin (CLEOCIN) IV  600 mg Intravenous Q6H  . dexamethasone  10 mg Intravenous Q8H  . lactobacillus acidophilus & bulgar  1 tablet Oral TID WC   Continuous Infusions: . 0.9 % NaCl with KCl 20 mEq / L 75 mL/hr at 06/07/13 0925   PRN Meds:.acetaminophen (TYLENOL) oral liquid 160 mg/5 mL, aminocaproic acid, HYDROcodone-acetaminophen, morphine injection, ondansetron (ZOFRAN) IV, ondansetron  Oral cavity hemostatic with tonsils surgically absent and eschar in fossae. Uvula midline, tongue mobility normal. Neck supple with resolving left reactive adenopathy. Trachea midline, breathing well with no stridor. CN 2-12 intact and symmetric, EOMI, PERRLA.    POD#1 from tonsillectomy for left peritonsillar abscess. From ENT standpoint can be discharged home when taking PO and clinically improved. I switched her back to a full liquid diet. Rx for clindamycin, hydrocodone, and zofran are on her chart, can follow up with Dr. Emeline Darling in 3-4 weeks, post-tonsillectomy/full liquid  diet for 2 weeks.

## 2013-06-07 NOTE — Progress Notes (Signed)
Pt tx per MD order, pt tol well, pt verbalized understanding of tx, pt notified family, report call to Consulting civil engineer, all questions answered, RN updated at [facility]

## 2013-06-08 LAB — CBC
HCT: 27.2 % — ABNORMAL LOW (ref 36.0–46.0)
Hemoglobin: 9.5 g/dL — ABNORMAL LOW (ref 12.0–15.0)
MCH: 31 pg (ref 26.0–34.0)
MCHC: 34.9 g/dL (ref 30.0–36.0)
MCV: 88.9 fL (ref 78.0–100.0)
Platelets: 216 10*3/uL (ref 150–400)
RBC: 3.06 MIL/uL — ABNORMAL LOW (ref 3.87–5.11)

## 2013-06-08 MED ORDER — HYDROCODONE-ACETAMINOPHEN 7.5-325 MG/15ML PO SOLN: 15.0000 mL | ORAL | Status: DC | PRN

## 2013-06-08 MED ORDER — CETYLPYRIDINIUM CHLORIDE 0.05 % MT LIQD: 5.0000 mL | Freq: Four times a day (QID) | OROMUCOSAL | Status: DC

## 2013-06-08 MED ORDER — CLINDAMYCIN PALMITATE HCL 75 MG/5ML PO SOLR: 300.0000 mg | Freq: Three times a day (TID) | ORAL | Status: DC

## 2013-06-08 NOTE — Discharge Summary (Signed)
Physician Discharge Summary  Veronica [name] UXL:[id number] DOB: 01/22/1981 DOA: 06/05/2013  PCP: No PCP Per Patient  Admit date: 06/05/2013 Discharge date: 06/08/2013  Time spent: 40 minutes  Recommendations for Outpatient Follow-up:  1. Followup with Dr. Emeline Darling and 3-4 weeks.  Discharge Diagnoses:  Active Problems:   Tonsillitis   Leukocytosis, unspecified   Hypokalemia   Hyponatremia   Abscess, peritonsillar   Discharge Condition: Stable  Diet recommendation: Regular diet, soft consistency then advance as tolerated  Filed Weights   06/05/13 1518  Weight: 74.707 kg (164 lb 11.2 oz)    History of present illness:  Veronica [name] is a 32 y.o. female  Who was seen in [facility] ER on 12/11- given an abx for tonsilitis that she never filled. Strep screen is still pending. Went back to the ER today 12/12 with worsening throat pain, and painful swallowing. Patient denies trouble breathing. Admits to fever. Positive sick contacts, Left ear pain and sore lymph nodes  In the ER, labs showed a WBC count and a CT was done that showed no clear abscess. Patient was sent from [facility] for possible ENT evaluation.   Hospital Course:   1. Acute necrotizing tonsillitis: Bilateral severe with left worse than right, CT scan showed airway displacement. There is left peritonsillar abscess. ENT consulted, Dr. Emeline Darling evaluated the patient in the lateral tonsillectomy and abscess drainage was done on 06/06/2013. Patient spent the night in stepdown, started on antibiotics and Decadron on the day of admission. On discharge clindamycin for 5 more days, liquid Norco prescribed for pain control.  2. Leukocytosis: Secondary to the cellulitis, this is resolved prior to discharge. Patient presented with WBCs of 18.5, today is 9.6.  3. Hyponatremia: Secondary to dehydration, the patient did have poor oral intake secondary to the tonsillitis, this is resolved after aggressive hydration with IV fluids.  4. Hypokalemia:  Corrected with IV potassium.  Procedures:  Bilateral tonsillectomy and left-sided peritonsillar abscess drainage  Consultations:  Dr. Melvenia Beam of [facility] ENT  Discharge Exam: Filed Vitals:   06/08/13 0557  BP: 119/69  Pulse: 52  Temp: 97.8 F (36.6 C)  Resp: 18   General: Alert and awake, oriented x3, not in any acute distress. HEENT: anicteric sclera, pupils reactive to light and accommodation, EOMI CVS: S1-S2 clear, no murmur rubs or gallops Chest: clear to auscultation bilaterally, no wheezing, rales or rhonchi Abdomen: soft nontender, nondistended, normal bowel sounds, no organomegaly Extremities: no cyanosis, clubbing or edema noted bilaterally Neuro: Cranial nerves II-XII intact, no focal neurological deficits  Discharge Instructions     Medication List    STOP taking these medications       HYDROcodone-acetaminophen 5-325 MG per tablet  Commonly known as:  NORCO  Replaced by:  HYDROcodone-acetaminophen 7.5-325 mg/15 ml solution      TAKE these medications       Cetylpyridinium Chloride 0.05 % Liqd  Use as directed 5 mLs in the mouth or throat 4 (four) times daily.     clindamycin 75 MG/5ML solution  Commonly known as:  CLEOCIN  Take 20 mLs (300 mg total) by mouth 3 (three) times daily.     HYDROcodone-acetaminophen 7.5-325 mg/15 ml solution  Commonly known as:  HYCET  Take 15 mLs by mouth every 4 (four) hours as needed for moderate pain or severe pain.       No Known Allergies     Follow-up Information   Follow up with Melvenia Beam, MD In 3 weeks.  Specialty:  Otolaryngology   Contact information:   [address] [place] [address] [phone]        The results of significant diagnostics from this hospitalization (including imaging, microbiology, ancillary and laboratory) are listed below for reference.    Significant Diagnostic Studies: Dg Cervical Spine Complete  06/04/2013   CLINICAL DATA:  Sore throat;  left-sided neck pain.  EXAM: CERVICAL SPINE  4+ VIEWS  COMPARISON:  Cervical spine radiographs performed 05/17/2008  FINDINGS: There is no evidence of fracture or subluxation. Vertebral bodies demonstrate normal height and alignment. Intervertebral disc spaces are preserved. Prevertebral soft tissues are within normal limits. The provided odontoid view demonstrates no significant abnormality.  The visualized lung apices are clear.  IMPRESSION: No evidence of fracture or subluxation along the cervical spine.   Electronically Signed   By: Roanna Raider M.D.   On: 06/04/2013 03:29   Ct Soft Tissue Neck W Contrast  06/05/2013   ADDENDUM REPORT: 06/05/2013 11:39  ADDENDUM: In the Impression: The airway is shifted to the right and remains patent. The sentence that states: Airway shifted to the right below the knee no remains patent , was inadvertently transcribed.   Electronically Signed   By: Salome Holmes M.D.   On: 06/05/2013 11:39   06/05/2013   CLINICAL DATA:  Esophageal, neck, and ear pain, difficulty swallowing  EXAM: CT NECK WITH CONTRAST  TECHNIQUE: Multidetector CT imaging of the neck was performed using the standard protocol following the bolus administration of intravenous contrast.  CONTRAST:  75mL OMNIPAQUE IOHEXOL 300 MG/ML  SOLN  COMPARISON:  None.  FINDINGS: Skull base is unremarkable.  There is diffuse enlargement of the tonsils on the left with a heterogeneous enhancement. No focal loculated fluid collections are identified. There is a mass effect upon the airway with shift to the right the airway is otherwise patent. There is obliteration of the parapharyngeal space on the left. An area of low attenuation projects within the prevertebral space, consistent with a component of prevertebral edema. The parapharyngeal space on the right is partially maintained.  Adenopathy is identified within the carotid chain lymph nodes largest on the left measuring 1.4 cm in short axis image 46 series 2. There  is heterogeneous enhancement of the inferior aspect the salivary gland on the left. Prominent submandibular lymph nodes are identified.  The parotid glands are symmetric. The right submandibular gland is unremarkable. Opacified vascular structures unremarkable. There is no evidence of free fluid or loculated fluid collections within the neck. The glottic and laryngeal regions are unremarkable. Evaluation of thoracic inlet is unremarkable. The lung apices are unremarkable.  IMPRESSION: Findings consistent with tonsillitis without definitive loculated fluid collection. Phlegmonous changes within the tonsils is a diagnostic consideration. These findings are appreciated on the left. Airway shifted to the right below-knee no remains patent. Adenopathy is appreciated within anterior and posterior cervical chain lymph nodes left greater than right. There findings which may represent a component of inflammatory changes within the salivary gland on the left.  Electronically Signed: By: Salome Holmes M.D. On: 06/05/2013 11:02    Microbiology: Recent Results (from the past 240 hour(s))  RAPID STREP SCREEN     Status: None   Collection Time    06/04/13  2:52 AM      Result Value Range Status   Streptococcus, Group A Screen (Direct) NEGATIVE  NEGATIVE Final   Comment: (NOTE)     A Rapid Antigen test may result negative if the  antigen level in the     sample is below the detection level of this test. The FDA has not     cleared this test as a stand-alone test therefore the rapid antigen     negative result has reflexed to a Group A Strep culture.  CULTURE, GROUP A STREP     Status: None   Collection Time    06/04/13  2:52 AM      Result Value Range Status   Specimen Description THROAT   Final   Special Requests NONE   Final   Culture     Final   Value: STREPTOCOCCUS,BETA HEMOLYTIC NOT GROUP A     Performed at [facility]   Report Status 06/06/2013 FINAL   Final  SURGICAL PCR SCREEN     Status:  None   Collection Time    06/06/13 10:18 AM      Result Value Range Status   MRSA, PCR NEGATIVE  NEGATIVE Final   Staphylococcus aureus NEGATIVE  NEGATIVE Final   Comment:            The Xpert SA Assay (FDA     approved for NASAL specimens     in patients over 68 years of age),     is one component of     a comprehensive surveillance     program.  Test performance has     been validated by [employer] for patients greater     than or equal to 48 year old.     It is not intended     to diagnose infection nor to     guide or monitor treatment.  ANAEROBIC CULTURE     Status: None   Collection Time    06/06/13 11:53 AM      Result Value Range Status   Specimen Description ABSCESS LEFT PERITONSILLAR   Final   Special Requests NONE   Final   Gram Stain     Final   Value: MODERATE WBC PRESENT, PREDOMINANTLY PMN     NO SQUAMOUS EPITHELIAL CELLS SEEN     FEW GRAM POSITIVE COCCI IN PAIRS     Performed at [facility]   Culture     Final   Value: NO ANAEROBES ISOLATED; CULTURE IN PROGRESS FOR 5 DAYS     Performed at [facility]   Report Status PENDING   Incomplete  CULTURE, ROUTINE-ABSCESS     Status: None   Collection Time    06/06/13 11:53 AM      Result Value Range Status   Specimen Description ABSCESS   Final   Special Requests LEFT PERITONSILLAR   Final   Gram Stain     Final   Value: MODERATE WBC PRESENT, PREDOMINANTLY PMN     NO SQUAMOUS EPITHELIAL CELLS SEEN     FEW GRAM POSITIVE COCCI IN PAIRS     Performed at [facility]   Culture     Final   Value: MULTIPLE ORGANISMS PRESENT, NONE PREDOMINANT     Performed at [facility]   Report Status PENDING   Incomplete  MRSA PCR SCREENING     Status: None   Collection Time    06/06/13  3:29 PM      Result Value Range Status   MRSA by PCR NEGATIVE  NEGATIVE Final   Comment:            The GeneXpert MRSA Assay (FDA  approved for NASAL specimens     only), is one component of a      comprehensive MRSA colonization     surveillance program. It is not     intended to diagnose MRSA     infection nor to guide or     monitor treatment for     MRSA infections.     Labs: Basic Metabolic Panel:  Recent Labs Lab 06/05/13 1042 06/06/13 0425 06/07/13 0440  NA 132* 137 138  K 3.2* 3.6 4.1  CL 96 102 104  CO2 25 25 25   GLUCOSE 117* 105* 125*  BUN 7 6 8   CREATININE 0.63 0.55 0.50  CALCIUM 8.4 8.5 8.6   Liver Function Tests: No results found for this basename: AST, ALT, ALKPHOS, BILITOT, PROT, ALBUMIN,  in the last 168 hours No results found for this basename: LIPASE, AMYLASE,  in the last 168 hours No results found for this basename: AMMONIA,  in the last 168 hours CBC:  Recent Labs Lab 06/05/13 1042 06/06/13 0425 06/07/13 0440 06/08/13 0509  WBC 17.0* 18.5* 17.7* 9.6  NEUTROABS 15.0*  --   --   --   HGB 11.0* 10.5* 10.1* 9.5*  HCT 32.7* 31.0* 29.8* 27.2*  MCV 89.8 90.9 89.8 88.9  PLT 217 213 196 216   Cardiac Enzymes: No results found for this basename: CKTOTAL, CKMB, CKMBINDEX, TROPONINI,  in the last 168 hours BNP: BNP (last 3 results) No results found for this basename: PROBNP,  in the last 8760 hours CBG:  Recent Labs Lab 06/07/13 0747  GLUCAP 120*       Signed:  Halyn Flaugher A  [facility] 06/08/2013, 11:04 AM

## 2013-06-08 NOTE — Progress Notes (Signed)
Sharla M Gudgel to be D/C'd Home per MD order.  Discussed with the patient and all questions fully answered.    Medication List    STOP taking these medications       HYDROcodone-acetaminophen 5-325 MG per tablet  Commonly known as:  NORCO  Replaced by:  HYDROcodone-acetaminophen 7.5-325 mg/15 ml solution      TAKE these medications       Cetylpyridinium Chloride 0.05 % Liqd  Use as directed 5 mLs in the mouth or throat 4 (four) times daily.     clindamycin 75 MG/5ML solution  Commonly known as:  CLEOCIN  Take 20 mLs (300 mg total) by mouth 3 (three) times daily.     HYDROcodone-acetaminophen 7.5-325 mg/15 ml solution  Commonly known as:  HYCET  Take 15 mLs by mouth every 4 (four) hours as needed for moderate pain or severe pain.        VVS, Skin clean, dry and intact without evidence of skin break down, no evidence of skin tears noted. IV catheter discontinued intact. Site without signs and symptoms of complications. Dressing and pressure applied.  An After Visit Summary was printed and given to the patient.  D/c education completed with patient/family including follow up instructions, medication list, d/c activities limitations if indicated, with other d/c instructions as indicated by MD - patient able to verbalize understanding, all questions fully answered.   Patient instructed to return to ED, call 911, or call MD for any changes in condition.   Patient escorted via WC, and D/C home via private auto.  Kennyth Arnold D 06/08/2013 12:29 PM

## 2013-06-08 NOTE — Plan of Care (Signed)
Problem: Discharge Progression Outcomes Goal: Tolerating diet Outcome: Completed/Met Date Met:  06/08/13 Full liquid diet x 2 weeks

## 2013-06-09 ENCOUNTER — Encounter (HOSPITAL_COMMUNITY): Payer: Self-pay | Admitting: Otolaryngology

## 2013-06-09 LAB — CULTURE, ROUTINE-ABSCESS

## 2013-06-09 MED FILL — Hydrocodone-Acetaminophen Tab 5-325 MG: ORAL | Qty: 6 | Status: AC

## 2013-06-10 ENCOUNTER — Inpatient Hospital Stay (HOSPITAL_COMMUNITY)
Admission: EM | Admit: 2013-06-10 | Discharge: 2013-06-12 | DRG: 921 | Disposition: A | Discharge status: Home / Self Care | Payer: Medicaid Other | Attending: Otolaryngology | Admitting: Otolaryngology

## 2013-06-10 ENCOUNTER — Encounter (HOSPITAL_COMMUNITY): Payer: Self-pay | Admitting: Emergency Medicine

## 2013-06-10 ENCOUNTER — Emergency Department (HOSPITAL_COMMUNITY): Payer: Medicaid Other

## 2013-06-10 DIAGNOSIS — Y929 Unspecified place or not applicable: Secondary | ICD-10-CM

## 2013-06-10 DIAGNOSIS — G8918 Other acute postprocedural pain: Secondary | ICD-10-CM

## 2013-06-10 DIAGNOSIS — Y849 Medical procedure, unspecified as the cause of abnormal reaction of the patient, or of later complication, without mention of misadventure at the time of the procedure: Secondary | ICD-10-CM | POA: Diagnosis present

## 2013-06-10 DIAGNOSIS — Z87891 Personal history of nicotine dependence: Secondary | ICD-10-CM

## 2013-06-10 DIAGNOSIS — T819XXA Unspecified complication of procedure, initial encounter: Principal | ICD-10-CM | POA: Diagnosis present

## 2013-06-10 DIAGNOSIS — E860 Dehydration: Secondary | ICD-10-CM | POA: Diagnosis present

## 2013-06-10 LAB — CBC WITH DIFFERENTIAL/PLATELET
Basophils Absolute: 0 10*3/uL (ref 0.0–0.1)
Eosinophils Absolute: 0.1 10*3/uL (ref 0.0–0.7)
Lymphs Abs: 2.9 10*3/uL (ref 0.7–4.0)
MCHC: 34.7 g/dL (ref 30.0–36.0)
MCV: 88.4 fL (ref 78.0–100.0)
Monocytes Absolute: 0.8 10*3/uL (ref 0.1–1.0)
Monocytes Relative: 12 % (ref 3–12)
Neutro Abs: 3.2 10*3/uL (ref 1.7–7.7)
Platelets: 311 10*3/uL (ref 150–400)
RDW: 12.7 % (ref 11.5–15.5)
WBC: 7 10*3/uL (ref 4.0–10.5)

## 2013-06-10 LAB — BASIC METABOLIC PANEL
BUN: 7 mg/dL (ref 6–23)
Calcium: 8.6 mg/dL (ref 8.4–10.5)
Chloride: 100 mEq/L (ref 96–112)
Creatinine, Ser: 0.68 mg/dL (ref 0.50–1.10)
GFR calc Af Amer: >90 mL/min (ref >90)
GFR calc non Af Amer: >90 mL/min (ref >90)
Sodium: 138 mEq/L (ref 135–145)

## 2013-06-10 MED ORDER — SODIUM CHLORIDE 0.9 % IV BOLUS (SEPSIS)
1000.0000 mL | Freq: Once | INTRAVENOUS | Status: AC
  Administered 2013-06-10: 1000 mL via INTRAVENOUS

## 2013-06-10 MED ORDER — IOHEXOL 300 MG/ML  SOLN
75.0000 mL | Freq: Once | INTRAMUSCULAR | Status: AC | PRN
  Administered 2013-06-10: 75 mL via INTRAVENOUS

## 2013-06-10 MED ORDER — SODIUM CHLORIDE 0.9 % IV SOLN
INTRAVENOUS | Status: DC
  Administered 2013-06-11: 08:00:00 via INTRAVENOUS

## 2013-06-10 MED ORDER — POTASSIUM CHLORIDE 10 MEQ/100ML IV SOLN
10.0000 meq | Freq: Once | INTRAVENOUS | Status: AC
  Administered 2013-06-10: 10 meq via INTRAVENOUS
  Filled 2013-06-10: qty 100

## 2013-06-10 MED ORDER — MORPHINE SULFATE 4 MG/ML IJ SOLN
4.0000 mg | Freq: Once | INTRAMUSCULAR | Status: AC
  Administered 2013-06-10: 4 mg via INTRAVENOUS
  Filled 2013-06-10: qty 1

## 2013-06-10 MED ORDER — ONDANSETRON HCL 4 MG/2ML IJ SOLN
4.0000 mg | Freq: Once | INTRAMUSCULAR | Status: AC
  Administered 2013-06-10: 4 mg via INTRAVENOUS
  Filled 2013-06-10: qty 2

## 2013-06-10 NOTE — ED Provider Notes (Signed)
CSN: [id number]     Arrival date & time 06/10/13  1919 History   First MD Initiated Contact with Patient 06/10/13 1953     Chief Complaint  Patient presents with  . Oral Swelling   (Consider location/radiation/quality/duration/timing/severity/associated sxs/prior Treatment) The history is provided by the patient.   patient here complaining of severe tonsillar pain this evening to the point where she can't swallow liquids. She had her tonsils removed 4 days ago. Pain has continued without bleeding. She cannot swallow her secretions. Denies any fever. Symptoms worse when she lays flat. Pain is characterized as sharp. No treatment used prior to arrival.  Past Medical History  Diagnosis Date  . Tonsillitis 06/05/2013   Past Surgical History  Procedure Laterality Date  . Brain surgery    . Tonsillectomy Bilateral 06/06/2013    Procedure: TONSILLECTOMY;  Surgeon: Melvenia Beam, MD;  Location: [facility] OR;  Service: ENT;  Laterality: Bilateral;   No family history on file. History  Substance Use Topics  . Smoking status: Former Games developer  . Smokeless tobacco: Never Used     Comment: ' QUIT SMOKING ALONG TIME AGO "  . Alcohol Use: No   OB History   Grav Para Term Preterm Abortions TAB SAB Ect Mult Living                 Review of Systems  All other systems reviewed and are negative.    Allergies  Review of patient's allergies indicates no known allergies.  Home Medications   Current Outpatient Rx  Name  Route  Sig  Dispense  Refill  . Cetylpyridinium Chloride 0.05 % LIQD   Mouth/Throat   Use as directed 5 mLs in the mouth or throat 4 (four) times daily.   118 mL   0   . clindamycin (CLEOCIN) 75 MG/5ML solution   Oral   Take 20 mLs (300 mg total) by mouth 3 (three) times daily.   300 mL   0   . HYDROcodone-acetaminophen (HYCET) 7.5-325 mg/15 ml solution   Oral   Take 15 mLs by mouth every 4 (four) hours as needed for moderate pain or severe pain.   120 mL   0    BP  118/54  Pulse 64  Temp(Src) 98.6 F (37 C) (Oral)  Resp 16  Ht 5\' 6"  (1.676 m)  Wt 164 lb 11.2 oz (74.707 kg)  BMI 26.60 kg/m2  SpO2 97%  LMP 05/28/2013 Physical Exam  Nursing note and vitals reviewed. Constitutional: She is oriented to person, place, and time. She appears well-developed and well-nourished.  Non-toxic appearance. No distress.  HENT:  Head: Normocephalic and atraumatic.  Mouth/Throat:    Eyes: Conjunctivae, EOM and lids are normal. Pupils are equal, round, and reactive to light.  Neck: Normal range of motion. Neck supple. No tracheal deviation present. No mass present.  Cardiovascular: Normal rate, regular rhythm and normal heart sounds.  Exam reveals no gallop.   No murmur heard. Pulmonary/Chest: Effort normal and breath sounds normal. No stridor. No respiratory distress. She has no decreased breath sounds. She has no wheezes. She has no rhonchi. She has no rales.  Abdominal: Soft. Normal appearance and bowel sounds are normal. She exhibits no distension. There is no tenderness. There is no rebound and no CVA tenderness.  Musculoskeletal: Normal range of motion. She exhibits no edema and no tenderness.  Neurological: She is alert and oriented to person, place, and time. She has normal strength. No cranial nerve deficit or sensory  deficit. GCS eye subscore is 4. GCS verbal subscore is 5. GCS motor subscore is 6.  Skin: Skin is warm and dry. No abrasion and no rash noted.  Psychiatric: She has a normal mood and affect. Her speech is normal and behavior is normal.    ED Course  Procedures (including critical care time) Labs Review Labs Reviewed  CBC WITH DIFFERENTIAL - Abnormal; Notable for the following:    RBC 3.52 (*)    Hemoglobin 10.8 (*)    HCT 31.1 (*)    All other components within normal limits  BASIC METABOLIC PANEL - Abnormal; Notable for the following:    Potassium 3.1 (*)    Glucose, Bld 100 (*)    All other components within normal limits    Imaging Review Ct Soft Tissue Neck W Contrast  06/10/2013   CLINICAL DATA:  Oral swelling  EXAM: CT NECK WITH CONTRAST  TECHNIQUE: Multidetector CT imaging of the neck was performed using the standard protocol following the bolus administration of intravenous contrast.  CONTRAST:  75mL OMNIPAQUE IOHEXOL 300 MG/ML  SOLN  COMPARISON:  Neck CT 06/05/2013  FINDINGS: Interval tonsillectomy with predominantly gas-filled tonsillar fossae. There is hemorrhage/debris layering the cavities. The soft palate and uvula show notable submucosal edema, which also involves the oral pharyngeal walls circumferentially. No hypopharyngeal or laryngeal edema. The airway is patent. The adenoid and lingular tonsils remained enlarged and hyper enhancing compatible with recent tonsillitis. Reactive bilateral cervical chain lymphadenopathy, without suppurative change. No retropharyngeal edema or venous occlusion. Unremarkable salivary glands. No acute osseous findings.  IMPRESSION: 1. Oropharyngeal edema, with notable thickening of the soft palate and uvula. The airway remains patent; no laryngeal edema. 2. Interval palatine tonsillectomy with complete removal of the phlegmonous left tonsil.   Electronically Signed   By: Tiburcio Pea M.D.   On: 06/10/2013 21:52    EKG Interpretation   None       MDM  No diagnosis found. Patient given IV fluids here and remains in severe pain. Spoke with ENT Dr., Dr. Jearld Fenton, he will come to ED to see the patient   Toy Baker, MD 06/10/13 2305

## 2013-06-10 NOTE — ED Notes (Signed)
Pt states she had her tonsils taken out 2 days ago. Last night she started feeling like it was getting difficulty to breath and swollen tongue. O2 saturation currently 98%

## 2013-06-10 NOTE — ED Notes (Signed)
Dr Allen at bedside  

## 2013-06-10 NOTE — H&P (Signed)
Veronica [name] is an 32 y.o. female.   Chief Complaint: sore throat/dehydraton HPI: Recent tonsillectomy and not drinking and states she cannot take any fluids even after IV fluids in ER. She had a CT scan with palate swelling.   Past Medical History  Diagnosis Date  . Tonsillitis 06/05/2013    Past Surgical History  Procedure Laterality Date  . Brain surgery    . Tonsillectomy Bilateral 06/06/2013    Procedure: TONSILLECTOMY;  Surgeon: Melvenia Beam, MD;  Location: [facility] OR;  Service: ENT;  Laterality: Bilateral;    No family history on file. Social History:  reports that she has quit smoking. She has never used smokeless tobacco. She reports that she does not drink alcohol or use illicit drugs.  Allergies: No Known Allergies   (Not in a hospital admission)  Results for orders placed during the hospital encounter of 06/10/13 (from the past 48 hour(s))  CBC WITH DIFFERENTIAL     Status: Abnormal   Collection Time    06/10/13  8:11 PM      Result Value Range   WBC 7.0  4.0 - 10.5 K/uL   RBC 3.52 (*) 3.87 - 5.11 MIL/uL   Hemoglobin 10.8 (*) 12.0 - 15.0 g/dL   HCT 36.6 (*) 44.0 - 34.7 %   MCV 88.4  78.0 - 100.0 fL   MCH 30.7  26.0 - 34.0 pg   MCHC 34.7  30.0 - 36.0 g/dL   RDW 42.5  95.6 - 38.7 %   Platelets 311  150 - 400 K/uL   Neutrophils Relative % 45  43 - 77 %   Lymphocytes Relative 42  12 - 46 %   Monocytes Relative 12  3 - 12 %   Eosinophils Relative 1  0 - 5 %   Basophils Relative 0  0 - 1 %   Neutro Abs 3.2  1.7 - 7.7 K/uL   Lymphs Abs 2.9  0.7 - 4.0 K/uL   Monocytes Absolute 0.8  0.1 - 1.0 K/uL   Eosinophils Absolute 0.1  0.0 - 0.7 K/uL   Basophils Absolute 0.0  0.0 - 0.1 K/uL   WBC Morphology FEW ATYPICAL LYMPHS NOTED    BASIC METABOLIC PANEL     Status: Abnormal   Collection Time    06/10/13  8:11 PM      Result Value Range   Sodium 138  135 - 145 mEq/L   Potassium 3.1 (*) 3.5 - 5.1 mEq/L   Chloride 100  96 - 112 mEq/L   CO2 31  19 - 32 mEq/L   Glucose,  Bld 100 (*) 70 - 99 mg/dL   BUN 7  6 - 23 mg/dL   Creatinine, Ser 5.64  0.50 - 1.10 mg/dL   Calcium 8.6  8.4 - 33.2 mg/dL   GFR calc non Af Amer >90  >90 mL/min   GFR calc Af Amer >90  >90 mL/min   Comment: (NOTE)     The eGFR has been calculated using the CKD EPI equation.     This calculation has not been validated in all clinical situations.     eGFR's persistently <90 mL/min signify possible Chronic Kidney     Disease.   Ct Soft Tissue Neck W Contrast  06/10/2013   CLINICAL DATA:  Oral swelling  EXAM: CT NECK WITH CONTRAST  TECHNIQUE: Multidetector CT imaging of the neck was performed using the standard protocol following the bolus administration of intravenous contrast.  CONTRAST:  75mL  OMNIPAQUE IOHEXOL 300 MG/ML  SOLN  COMPARISON:  Neck CT 06/05/2013  FINDINGS: Interval tonsillectomy with predominantly gas-filled tonsillar fossae. There is hemorrhage/debris layering the cavities. The soft palate and uvula show notable submucosal edema, which also involves the oral pharyngeal walls circumferentially. No hypopharyngeal or laryngeal edema. The airway is patent. The adenoid and lingular tonsils remained enlarged and hyper enhancing compatible with recent tonsillitis. Reactive bilateral cervical chain lymphadenopathy, without suppurative change. No retropharyngeal edema or venous occlusion. Unremarkable salivary glands. No acute osseous findings.  IMPRESSION: 1. Oropharyngeal edema, with notable thickening of the soft palate and uvula. The airway remains patent; no laryngeal edema. 2. Interval palatine tonsillectomy with complete removal of the phlegmonous left tonsil.   Electronically Signed   By: Tiburcio Pea M.D.   On: 06/10/2013 21:52    Review of Systems  Constitutional: Positive for malaise/fatigue.  HENT: Positive for sore throat.   Eyes: Negative.   Respiratory: Negative.   Cardiovascular: Negative.   Gastrointestinal: Negative.   Skin: Negative.   Neurological: Positive for  weakness.    Blood pressure 148/76, pulse 64, temperature 98.6 F (37 C), temperature source Oral, resp. rate 16, height 5\' 6"  (1.676 m), weight 74.707 kg (164 lb 11.2 oz), last menstrual period 05/28/2013, SpO2 94.00%. Physical Exam  Constitutional: She appears well-developed.  HENT:  Alert. Nose clear. OC/OP- swelling of the soft palate and uvula but no erythema. Exudate in the tonsill fossas. No clots. Tongue normal.   Eyes: Conjunctivae are normal.  Neck: Neck supple.  Cardiovascular: Normal rate.   Respiratory: Effort normal.  GI: Soft.  Musculoskeletal: Normal range of motion.     Assessment/Plan Post tonsillectomy complication- she cannot drink and severe sore throat. Admit for IV fluids and pain meds.   Suzanna Obey 06/10/2013, 11:26 PM

## 2013-06-10 NOTE — ED Notes (Addendum)
Pt reports she cant swallow her saliva and has noticed blood in saliva and reports diarrhea that started monday

## 2013-06-11 ENCOUNTER — Encounter (HOSPITAL_COMMUNITY): Payer: Self-pay | Admitting: *Deleted

## 2013-06-11 LAB — BASIC METABOLIC PANEL
BUN: 5 mg/dL — ABNORMAL LOW (ref 6–23)
CO2: 27 mEq/L (ref 19–32)
Chloride: 101 mEq/L (ref 96–112)
Glucose, Bld: 90 mg/dL (ref 70–99)
Potassium: 3.3 mEq/L — ABNORMAL LOW (ref 3.5–5.1)

## 2013-06-11 LAB — ANAEROBIC CULTURE

## 2013-06-11 MED ORDER — DEXAMETHASONE SODIUM PHOSPHATE 10 MG/ML IJ SOLN
10.0000 mg | Freq: Three times a day (TID) | INTRAMUSCULAR | Status: AC
  Administered 2013-06-11 – 2013-06-12 (×3): 10 mg via INTRAVENOUS
  Filled 2013-06-11 (×3): qty 1

## 2013-06-11 MED ORDER — ONDANSETRON HCL 4 MG PO TABS: 4.0000 mg | ORAL_TABLET | ORAL | Status: DC | PRN

## 2013-06-11 MED ORDER — OXYCODONE-ACETAMINOPHEN 5-325 MG/5ML PO SOLN
5.0000 mL | ORAL | Status: DC | PRN
  Filled 2013-06-11: qty 10

## 2013-06-11 MED ORDER — CHLORHEXIDINE GLUCONATE 0.12 % MT SOLN
15.0000 mL | Freq: Two times a day (BID) | OROMUCOSAL | Status: DC
  Administered 2013-06-11 (×2): 15 mL via OROMUCOSAL
  Filled 2013-06-11 (×2): qty 15

## 2013-06-11 MED ORDER — ONDANSETRON HCL 4 MG/2ML IJ SOLN: 4.0000 mg | INTRAMUSCULAR | Status: DC | PRN

## 2013-06-11 MED ORDER — KCL-LACTATED RINGERS 20 MEQ/L IV SOLN
INTRAVENOUS | Status: DC
  Administered 2013-06-11 (×2): via INTRAVENOUS
  Filled 2013-06-11 (×4): qty 1000

## 2013-06-11 MED ORDER — BIOTENE DRY MOUTH MT LIQD
15.0000 mL | Freq: Two times a day (BID) | OROMUCOSAL | Status: DC
  Administered 2013-06-11: 15 mL via OROMUCOSAL

## 2013-06-11 MED ORDER — AMINOCAPROIC ACID SOLUTION 5% (50 MG/ML)
5.0000 mL | Freq: Four times a day (QID) | ORAL | Status: DC
  Administered 2013-06-11 – 2013-06-12 (×3): 5 mL via ORAL
  Filled 2013-06-11: qty 100

## 2013-06-11 MED ORDER — ACETAMINOPHEN 160 MG/5ML PO SOLN
325.0000 mg | ORAL | Status: DC | PRN
  Administered 2013-06-12: 325 mg via ORAL
  Filled 2013-06-11: qty 20.3

## 2013-06-11 MED ORDER — MORPHINE SULFATE 2 MG/ML IJ SOLN
2.0000 mg | INTRAMUSCULAR | Status: DC | PRN
  Administered 2013-06-11 (×2): 2 mg via INTRAVENOUS
  Administered 2013-06-11 (×2): 4 mg via INTRAVENOUS
  Administered 2013-06-12: 2 mg via INTRAVENOUS
  Filled 2013-06-11: qty 2
  Filled 2013-06-11 (×3): qty 1
  Filled 2013-06-11: qty 2

## 2013-06-11 MED ORDER — METRONIDAZOLE IN NACL 5-0.79 MG/ML-% IV SOLN
500.0000 mg | Freq: Three times a day (TID) | INTRAVENOUS | Status: DC
  Administered 2013-06-11 – 2013-06-12 (×4): 500 mg via INTRAVENOUS
  Filled 2013-06-11 (×5): qty 100

## 2013-06-11 MED ORDER — CLINDAMYCIN PHOSPHATE 600 MG/50ML IV SOLN
600.0000 mg | Freq: Three times a day (TID) | INTRAVENOUS | Status: DC
  Administered 2013-06-11 – 2013-06-12 (×5): 600 mg via INTRAVENOUS
  Filled 2013-06-11 (×7): qty 50

## 2013-06-11 NOTE — Progress Notes (Signed)
  Subjective: She is still complaining about severe throat pain. She is trying to swallow fluids. No bleeding. No breathing problem  Objective: Vital signs in last 24 hours: Temp:  [97.9 F (36.6 C)-98.6 F (37 C)] 97.9 F (36.6 C) (12/18 0545) Pulse Rate:  [55-65] 57 (12/18 0545) Resp:  [16-20] 20 (12/18 0545) BP: (118-148)/(54-80) 129/68 mmHg (12/18 0545) SpO2:  [94 %-100 %] 99 % (12/18 0545) Weight:  [74.707 kg (164 lb 11.2 oz)-77.4 kg (170 lb 10.2 oz)] 77.4 kg (170 lb 10.2 oz) (12/18 0127) Last BM Date: 06/10/13  Intake/Output from previous day: 12/17 0701 - 12/18 0700 In: 934.6 [P.O.:120; I.V.:814.6] Out: -  Intake/Output this shift: Total I/O In: 120 [P.O.:120] Out: -   awake and alert. no stridor. no change in the pharynx or neck. no tenderness in extremities  Lab Results:   Recent Labs  06/10/13 2011  WBC 7.0  HGB 10.8*  HCT 31.1*  PLT 311   BMET  Recent Labs  06/10/13 2011  NA 138  K 3.1*  CL 100  CO2 31  GLUCOSE 100*  BUN 7  CREATININE 0.68  CALCIUM 8.6   PT/INR No results found for this basename: LABPROT, INR,  in the last 72 hours ABG No results found for this basename: PHART, PCO2, PO2, HCO3,  in the last 72 hours  Studies/Results: Ct Soft Tissue Neck W Contrast  06/10/2013   CLINICAL DATA:  Oral swelling  EXAM: CT NECK WITH CONTRAST  TECHNIQUE: Multidetector CT imaging of the neck was performed using the standard protocol following the bolus administration of intravenous contrast.  CONTRAST:  75mL OMNIPAQUE IOHEXOL 300 MG/ML  SOLN  COMPARISON:  Neck CT 06/05/2013  FINDINGS: Interval tonsillectomy with predominantly gas-filled tonsillar fossae. There is hemorrhage/debris layering the cavities. The soft palate and uvula show notable submucosal edema, which also involves the oral pharyngeal walls circumferentially. No hypopharyngeal or laryngeal edema. The airway is patent. The adenoid and lingular tonsils remained enlarged and hyper enhancing  compatible with recent tonsillitis. Reactive bilateral cervical chain lymphadenopathy, without suppurative change. No retropharyngeal edema or venous occlusion. Unremarkable salivary glands. No acute osseous findings.  IMPRESSION: 1. Oropharyngeal edema, with notable thickening of the soft palate and uvula. The airway remains patent; no laryngeal edema. 2. Interval palatine tonsillectomy with complete removal of the phlegmonous left tonsil.   Electronically Signed   By: Tiburcio Pea M.D.   On: 06/10/2013 21:52    Anti-infectives: Anti-infectives   Start     Dose/Rate Route Frequency Ordered Stop   06/11/13 1000  metroNIDAZOLE (FLAGYL) IVPB 500 mg     500 mg 100 mL/hr over 60 Minutes Intravenous Every 8 hours 06/11/13 0843     06/11/13 0133  clindamycin (CLEOCIN) IVPB 600 mg     600 mg 100 mL/hr over 30 Minutes Intravenous 3 times per day 06/11/13 0133        Assessment/Plan: s/p * No surgery found * she is still not ready to discharge. she has 3.1 K so added KCL to IV fluids. she should ambulate and continue trying to get fluids down.   LOS: 1 day    Suzanna Obey 06/11/2013

## 2013-06-11 NOTE — Progress Notes (Signed)
UR completed.  Patient changed to inpatient r/t requiring IVF @ 100cc/hr and poor PO intake.

## 2013-06-11 NOTE — Progress Notes (Signed)
Feeling much better since IV rehydration. Awake and alert. She has had some ice cream and is going to try a soft diet for dinner. Continue IV fluids and anticipate discharge tomorrow.

## 2013-06-12 MED ORDER — POTASSIUM CHLORIDE 20 MEQ/15ML (10%) PO LIQD
40.0000 meq | Freq: Once | ORAL | Status: AC
  Administered 2013-06-12: 40 meq via ORAL
  Filled 2013-06-12: qty 30

## 2013-06-12 NOTE — Discharge Summary (Signed)
06/12/2013  11:50 AM  Date of Admission:06/10/2013 Date of Discharge:06/12/2013  Discharge ZH:YQMV, Clovis Riley, MD  Admitting HQ:IONGE  Reason for admission/final discharge diagnosis:dehydration and throat pain s/p tonsillectomy  Labs: see EPIC, potassium repleted  Procedure(s) performed:none during this admission, tonsillectomy on 06/06/13.  Discharge Condition:improved  Discharge Exam:oral cavity with tonsils surgically absent and hemostatic, CN 2-12 intact and symmetric. Taking excellent PO.  Discharge Instructions: No heavy lifting >25 lbs or straining for 14 days post-op, post-tonsillectomy soft or full liquid diet as tolerated, Rx for oxycodone/acetaminophen is on your chart, finish your antibiotics as prescribed, follow up with  Dr. Emeline Darling at [facility] as needed.  Hospital Course: readmitted POD#3 from tonsillectomy for throat pain, dehydration, and poor PO intake. Improved with pain medication, IV fluids, and decadron, discharged on POD#6 in good condition.  Melvenia Beam 11:50 AM 06/12/2013

## 2013-06-12 NOTE — Progress Notes (Signed)
Subjective: POD#6 from tonsillectomy for large left peritonsillar abscess and necrotizing tonsillitis, readmitted POD#3 for pain, dehydration, poor PO intake. Feels much better today, prefers the oxycodone/acetaminophen, took of PO yesterday.  Objective: Vital signs in last 24 hours: Temp:  [97.8 F (36.6 C)-98.7 F (37.1 C)] 97.8 F (36.6 C) (12/19 0500) Pulse Rate:  [56-68] 58 (12/19 0600) Resp:  [16-20] 16 (12/19 0600) BP: (122-132)/(55-69) 128/68 mmHg (12/19 0600) SpO2:  [87 %-99 %] 98 % (12/19 0600)  Oral cavity with tonsils surgically absent and hemostatic. CN 2-12 intact and symmetric and EOMI, PERRLA, neck supple, normal voice.  @LABLAST2 (wbc:2,hgb:2,hct:2,plt:2)  Recent Labs  06/10/13 2011 06/11/13 1100  NA 138 135  K 3.1* 3.3*  CL 100 101  CO2 31 27  GLUCOSE 100* 90  BUN 7 5*  CREATININE 0.68 0.54  CALCIUM 8.6 8.0*    Medications:  Scheduled Meds: . aminocaproic acid  5 mL Oral QID  . antiseptic oral rinse  15 mL Mouth Rinse q12n4p  . chlorhexidine  15 mL Mouth Rinse BID  . clindamycin (CLEOCIN) IV  600 mg Intravenous Q8H  . metronidazole  500 mg Intravenous Q8H  . potassium chloride  40 mEq Oral Once   Continuous Infusions: . lactated ringers with KCl 20 mEq/L 100 mL/hr at 06/11/13 2201   PRN Meds:.acetaminophen (TYLENOL) oral liquid 160 mg/5 mL, morphine injection, ondansetron (ZOFRAN) IV, ondansetron, oxyCODONE-acetaminophen  Assessment/Plan: POD#6 from tonsillectomy for tonsillitis/PTA. Feels much better, clinically looks much better, will discharge when patient feels she is ready and can tolerate PO pain medications and PO diet at home. I wrote Rx for Roxicet and placed on her chart as she states she is out of the hydrocodone/acetaminophen from her previous admission.   LOS: 2 days   Melvenia Beam 06/12/2013, 8:04 AM

## 2013-07-12 ENCOUNTER — Emergency Department (HOSPITAL_COMMUNITY)
Admission: EM | Admit: 2013-07-12 | Discharge: 2013-07-12 | Disposition: A | Discharge status: Home / Self Care | Payer: Medicaid Other | Attending: Emergency Medicine | Admitting: Emergency Medicine

## 2013-07-12 ENCOUNTER — Encounter (HOSPITAL_COMMUNITY): Payer: Self-pay | Admitting: Emergency Medicine

## 2013-07-12 DIAGNOSIS — N751 Abscess of Bartholin's gland: Secondary | ICD-10-CM

## 2013-07-12 MED ORDER — LIDOCAINE-EPINEPHRINE 2 %-1:100000 IJ SOLN: 20.0000 mL | Freq: Once | INTRAMUSCULAR | Status: DC

## 2013-07-12 MED ORDER — HYDROMORPHONE HCL PF 1 MG/ML IJ SOLN
1.0000 mg | Freq: Once | INTRAMUSCULAR | Status: AC
  Administered 2013-07-12: 1 mg via INTRAMUSCULAR
  Filled 2013-07-12: qty 1

## 2013-07-12 MED ORDER — LIDOCAINE-EPINEPHRINE 1 %-1:100000 IJ SOLN
20.0000 mL | Freq: Once | INTRAMUSCULAR | Status: AC
  Administered 2013-07-12: 20 mL via INTRADERMAL

## 2013-07-12 MED ORDER — HYDROCODONE-ACETAMINOPHEN 5-325 MG PO TABS
1.0000 | ORAL_TABLET | Freq: Once | ORAL | Status: AC
  Administered 2013-07-12: 1 via ORAL
  Filled 2013-07-12: qty 1

## 2013-07-12 MED ORDER — HYDROCODONE-ACETAMINOPHEN 5-325 MG PO TABS: 1.0000 | ORAL_TABLET | ORAL | Status: DC | PRN

## 2013-07-12 NOTE — ED Provider Notes (Signed)
CSN: [id number]     Arrival date & time 07/12/13  1532 History   First MD Initiated Contact with Patient 07/12/13 2003     Chief Complaint  Patient presents with  . Abscess   (Consider location/radiation/quality/duration/timing/severity/associated sxs/prior Treatment) Patient is a 33 y.o. female presenting with abscess. The history is provided by the patient.  Abscess Location:  Ano-genital Ano-genital abscess location:  Vagina Abscess quality: draining, fluctuance, induration, painful, redness and warmth   Red streaking: no   Duration:  2 days Progression:  Worsening Pain details:    Quality:  Sharp and shooting   Severity:  Moderate   Duration:  2 days   Timing:  Constant   Progression:  Unchanged Chronicity:  New Context: not diabetes, not immunosuppression, not injected drug use, not insect bite/sting and not skin injury   Relieved by:  Warm compresses Worsened by:  Nothing tried Ineffective treatments:  None tried Associated symptoms: no anorexia, no fatigue, no fever, no headaches, no nausea and no vomiting   Risk factors: prior abscess   Risk factors: no hx of MRSA     Past Medical History  Diagnosis Date  . Tonsillitis 06/05/2013   Past Surgical History  Procedure Laterality Date  . Brain surgery    . Tonsillectomy Bilateral 06/06/2013    Procedure: TONSILLECTOMY;  Surgeon: Melvenia Beam, MD;  Location: [facility] OR;  Service: ENT;  Laterality: Bilateral;   History reviewed. No pertinent family history. History  Substance Use Topics  . Smoking status: Never Smoker   . Smokeless tobacco: Never Used     Comment: ' QUIT SMOKING ALONG TIME AGO "  . Alcohol Use: No   OB History   Grav Para Term Preterm Abortions TAB SAB Ect Mult Living                 Review of Systems  Constitutional: Negative for fever, chills and fatigue.  HENT: Negative for congestion and rhinorrhea.   Eyes: Negative for redness and visual disturbance.  Respiratory: Negative for shortness of  breath and wheezing.   Cardiovascular: Negative for chest pain and palpitations.  Gastrointestinal: Negative for nausea, vomiting and anorexia.  Genitourinary: Negative for dysuria and urgency.  Musculoskeletal: Negative for arthralgias and myalgias.  Skin: Positive for color change and wound. Negative for pallor.  Neurological: Negative for dizziness and headaches.    Allergies  Review of patient's allergies indicates no known allergies.  Home Medications   Current Outpatient Rx  Name  Route  Sig  Dispense  Refill  . HYDROcodone-acetaminophen (NORCO/VICODIN) 5-325 MG per tablet   Oral   Take 1 tablet by mouth every 4 (four) hours as needed.   6 tablet   0    BP 114/78  Pulse 88  Temp(Src) 98.2 F (36.8 C) (Oral)  Resp 18  SpO2 100% Physical Exam  Constitutional: She is oriented to person, place, and time. She appears well-developed and well-nourished. No distress.  HENT:  Head: Normocephalic and atraumatic.  Eyes: EOM are normal. Pupils are equal, round, and reactive to light.  Neck: Normal range of motion. Neck supple.  Cardiovascular: Normal rate and regular rhythm.  Exam reveals no gallop and no friction rub.   No murmur heard. Pulmonary/Chest: Effort normal. She has no wheezes. She has no rales.  Abdominal: Soft. She exhibits no distension. There is no tenderness.  Genitourinary:     Musculoskeletal: She exhibits no edema and no tenderness.  Neurological: She is alert and oriented to person, place,  and time.  Skin: Skin is warm and dry. She is not diaphoretic.  Psychiatric: She has a normal mood and affect. Her behavior is normal.    ED Course  INCISION AND DRAINAGE Date/Time: 07/12/2013 11:56 PM Performed by: Adela LankFLOYD, Everlie Eble Authorized by: Melene PlanFLOYD, Kamyia Thomason Consent: Verbal consent obtained. Risks and benefits: risks, benefits and alternatives were discussed Consent given by: patient Patient understanding: patient states understanding of the procedure being  performed Patient consent: the patient's understanding of the procedure matches consent given Procedure consent: procedure consent matches procedure scheduled Required items: required blood products, implants, devices, and special equipment available Patient identity confirmed: verbally with patient Time out: Immediately prior to procedure a "time out" was called to verify the correct patient, procedure, equipment, support staff and site/side marked as required. Type: abscess Body area: anogenital Location details: Bartholin's gland Anesthesia: local infiltration Local anesthetic: lidocaine 1% with epinephrine Anesthetic total: 8 ml Patient sedated: no Scalpel size: 11 Incision type: single straight Complexity: complex Drainage: purulent and serosanguinous Drainage amount: copious Wound treatment: wound left open and drain placed Packing material: 1in gauze. Patient tolerance: Patient tolerated the procedure well with no immediate complications.   (including critical care time) Labs Review Labs Reviewed - No data to display Imaging Review No results found.  EKG Interpretation   None       MDM   1. Bartholin's gland abscess    Patient is a 33 y.o. female who presents with bartholin gland abscess.  This started about three days ago.  Have had this ongoing for past few months, usually resolves with warm compresses.  Denies fever, chills, dysuria.  Very large bartholins gland abscess, drained at bedside, word catheter placed with 3cc of fluid.  Patient will follow up with OB.   I have discussed the diagnosis/risks/treatment options with the patient and believe the pt to be eligible for discharge home to follow-up with OB. We also discussed returning to the ED immediately if new or worsening sx occur. We discussed the sx which are most concerning (e.g., fever, chills, n/v) that necessitate immediate return. Any new prescriptions provided to the patient are listed  below.  Discharge Medication List as of 07/12/2013  9:54 PM    START taking these medications   Details  HYDROcodone-acetaminophen (NORCO/VICODIN) 5-325 MG per tablet Take 1 tablet by mouth every 4 (four) hours as needed., Starting 07/12/2013, Until Discontinued, Print            Melene Planan Nichalas Coin, MD 07/12/13 [phone]

## 2013-07-12 NOTE — ED Notes (Signed)
Patient moved to nursing station via wheelchair to monitor 30 minutes post medication administration.

## 2013-07-12 NOTE — ED Notes (Signed)
MD at bedside. 

## 2013-07-12 NOTE — ED Notes (Signed)
Pt presents to department for evaluation of L sided vaginal abscess. Ongoing for several days. States green colored drainage from site. No relief with warm compresses. 8/10 pain upon arrival. Pt is alert and oriented x4.

## 2013-07-12 NOTE — Discharge Instructions (Signed)
Follow up with OB, put your bottom in warm water four times a day Bartholin's Cyst or Abscess Bartholin's glands are small glands located within the folds of skin (labia) along the sides of the lower opening of the vagina (birth canal). A cyst may develop when the duct of the gland becomes blocked. When this happens, fluid that accumulates within the cyst can become infected. This is known as an abscess. The Bartholin gland produces a mucous fluid to lubricate the outside of the vagina during sexual intercourse. SYMPTOMS   Patients with a small cyst may not have any symptoms.  Mild discomfort to severe pain depending on the size of the cyst and if it is infected (abscess).  Pain, redness, and swelling around the lower opening of the vagina.  Painful intercourse.  Pressure in the perineal area.  Swelling of the lips of the vagina (labia).  The cyst or abscess can be on one side or both sides of the vagina. DIAGNOSIS   A large swelling is seen in the lower vagina area by your caregiver.  Painful to touch.  Redness and pain, if it is an abscess. TREATMENT   Sometimes the cyst will go away on its own.  Apply warm wet compresses to the area or take hot sitz baths several times a day.  An incision to drain the cyst or abscess with local anesthesia.  Culture the pus, if it is an abscess.  Antibiotic treatment, if it is an abscess.  Cut open the gland and suture the edges to make the opening of the gland bigger (marsupialization).  Remove the whole gland if the cyst or abscess returns. PREVENTION   Practice good hygiene.  Clean the vaginal area with a mild soap and soft cloth when bathing.  Do not rub hard in the vaginal area when bathing.  Protect the crotch area with a padded cushion if you take long bike rides or ride horses.  Be sure you are well lubricated when you have sexual intercourse. HOME CARE INSTRUCTIONS   If your cyst or abscess was opened, a small piece of  gauze, or a drain, may have been placed in the wound to allow drainage. Do not remove this gauze or drain unless directed by your caregiver.  Wear feminine pads, not tampons, as needed for any drainage or bleeding.  If antibiotics were prescribed, take them exactly as directed. Finish the entire course.  Only take over-the-counter or prescription medicines for pain, discomfort, or fever as directed by your caregiver. SEEK IMMEDIATE MEDICAL CARE IF:   You have an increase in pain, redness, swelling, or drainage.  You have bleeding from the wound which results in the use of more than the number of pads suggested by your caregiver in 24 hours.  You have chills.  You have a fever.  You develop any new problems (symptoms) or aggravation of your existing condition. MAKE SURE YOU:   Understand these instructions.  Will watch your condition.  Will get help right away if you are not doing well or get worse. Document Released: 06/11/2005 Document Revised: 09/03/2011 Document Reviewed: 01/28/2008 [facility] Patient Information 2014 [place], [place].

## 2013-07-13 MED ORDER — DOXYCYCLINE HYCLATE 100 MG PO CAPS: 100.0000 mg | ORAL_CAPSULE | Freq: Two times a day (BID) | ORAL | Status: DC

## 2013-07-13 NOTE — ED Provider Notes (Signed)
I saw and evaluated the patient, reviewed the resident's note and I agree with the findings and plan. If applicable, I agree with the resident's interpretation of the EKG.  If applicable, I was present for critical portions of any procedures performed.  Large R bartholin abscess, golf ball size. Abdomen nontender and soft.  Glynn OctaveStephen Govanni Plemons, MD 07/13/13 [phone]

## 2014-03-16 ENCOUNTER — Ambulatory Visit: Payer: Medicaid Other | Admitting: Diagnostic Neuroimaging

## 2014-03-19 ENCOUNTER — Ambulatory Visit: Payer: Medicaid Other | Admitting: Diagnostic Neuroimaging

## 2014-04-09 ENCOUNTER — Other Ambulatory Visit: Payer: Self-pay

## 2014-07-12 ENCOUNTER — Ambulatory Visit: Payer: Medicaid Other | Admitting: Neurology

## 2014-07-16 ENCOUNTER — Ambulatory Visit: Payer: Medicaid Other | Admitting: Neurology

## 2014-07-22 ENCOUNTER — Encounter: Payer: Self-pay | Admitting: Neurology

## 2014-07-22 ENCOUNTER — Ambulatory Visit (INDEPENDENT_AMBULATORY_CARE_PROVIDER_SITE_OTHER): Payer: Medicaid Other | Admitting: Neurology

## 2014-07-22 VITALS — BP 118/51 | HR 70 | Ht 66.0 in | Wt 174.6 lb

## 2014-07-22 DIAGNOSIS — G8929 Other chronic pain: Secondary | ICD-10-CM

## 2014-07-22 DIAGNOSIS — M545 Low back pain, unspecified: Secondary | ICD-10-CM

## 2014-07-22 DIAGNOSIS — M47812 Spondylosis without myelopathy or radiculopathy, cervical region: Secondary | ICD-10-CM | POA: Insufficient documentation

## 2014-07-22 HISTORY — DX: Low back pain, unspecified: M54.50

## 2014-07-22 HISTORY — DX: Spondylosis without myelopathy or radiculopathy, cervical region: M47.812

## 2014-07-22 MED ORDER — GABAPENTIN 300 MG PO CAPS: ORAL_CAPSULE | ORAL | Status: DC

## 2014-07-22 MED ORDER — AMITRIPTYLINE HCL 10 MG PO TABS: ORAL_TABLET | ORAL | Status: DC

## 2014-07-22 NOTE — Patient Instructions (Signed)
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
## 2014-07-27 ENCOUNTER — Telehealth: Payer: Self-pay | Admitting: *Deleted

## 2014-07-27 NOTE — Telephone Encounter (Signed)
Requested mri cervical and lumbar spine reports for Dr Anne Hahn from [facility] 07-27-14.

## 2014-07-28 ENCOUNTER — Telehealth: Payer: Self-pay | Admitting: Neurology

## 2014-07-28 NOTE — Telephone Encounter (Signed)
Received report Dr Anne Hahn requested from [facility] 07-28-14.

## 2014-07-28 NOTE — Telephone Encounter (Signed)
I received the results of the MRI study of the cervical and lumbar spine done in August 2015. The lumbar spine showed minimal disc bulge at the L4-5 level without spinal stenosis. No evidence of nerve root impingement was seen. MRI of the cervical spine showed a disc bulge at the C4-5 level to the left, without spinal cord impingement, and without evidence of nerve root impingement. There is a disc bulge at the C5-6 level as well without significant spinal stenosis.

## 2014-10-28 ENCOUNTER — Other Ambulatory Visit: Payer: Medicaid Other | Admitting: Adult Health

## 2014-10-29 ENCOUNTER — Encounter: Payer: Self-pay | Admitting: Adult Health

## 2014-10-29 ENCOUNTER — Ambulatory Visit (INDEPENDENT_AMBULATORY_CARE_PROVIDER_SITE_OTHER): Payer: Medicaid Other | Admitting: Adult Health

## 2014-10-29 ENCOUNTER — Other Ambulatory Visit (HOSPITAL_COMMUNITY)
Admission: RE | Admit: 2014-10-29 | Discharge: 2014-10-29 | Disposition: A | Discharge status: Home / Self Care | Payer: Medicaid Other | Source: Ambulatory Visit | Attending: Adult Health | Admitting: Adult Health

## 2014-10-29 VITALS — BP 120/72 | HR 64 | Ht 66.5 in | Wt 179.0 lb

## 2014-10-29 DIAGNOSIS — B9689 Other specified bacterial agents as the cause of diseases classified elsewhere: Secondary | ICD-10-CM | POA: Insufficient documentation

## 2014-10-29 DIAGNOSIS — N760 Acute vaginitis: Secondary | ICD-10-CM

## 2014-10-29 DIAGNOSIS — N750 Cyst of Bartholin's gland: Secondary | ICD-10-CM

## 2014-10-29 DIAGNOSIS — Z1151 Encounter for screening for human papillomavirus (HPV): Secondary | ICD-10-CM | POA: Insufficient documentation

## 2014-10-29 DIAGNOSIS — N898 Other specified noninflammatory disorders of vagina: Secondary | ICD-10-CM | POA: Insufficient documentation

## 2014-10-29 DIAGNOSIS — Z01419 Encounter for gynecological examination (general) (routine) without abnormal findings: Secondary | ICD-10-CM | POA: Insufficient documentation

## 2014-10-29 DIAGNOSIS — Z113 Encounter for screening for infections with a predominantly sexual mode of transmission: Secondary | ICD-10-CM | POA: Diagnosis present

## 2014-10-29 DIAGNOSIS — R5383 Other fatigue: Secondary | ICD-10-CM

## 2014-10-29 DIAGNOSIS — N920 Excessive and frequent menstruation with regular cycle: Secondary | ICD-10-CM

## 2014-10-29 DIAGNOSIS — Z0000 Encounter for general adult medical examination without abnormal findings: Secondary | ICD-10-CM

## 2014-10-29 DIAGNOSIS — A499 Bacterial infection, unspecified: Secondary | ICD-10-CM | POA: Diagnosis not present

## 2014-10-29 DIAGNOSIS — N946 Dysmenorrhea, unspecified: Secondary | ICD-10-CM | POA: Insufficient documentation

## 2014-10-29 HISTORY — DX: Other specified bacterial agents as the cause of diseases classified elsewhere: B96.89

## 2014-10-29 HISTORY — DX: Cyst of Bartholin's gland: N75.0

## 2014-10-29 HISTORY — DX: Other specified noninflammatory disorders of vagina: N89.8

## 2014-10-29 HISTORY — DX: Other fatigue: R53.83

## 2014-10-29 HISTORY — DX: Dysmenorrhea, unspecified: N94.6

## 2014-10-29 HISTORY — DX: Excessive and frequent menstruation with regular cycle: N92.0

## 2014-10-29 LAB — POCT WET PREP (WET MOUNT): WBC, Wet Prep HPF POC: POSITIVE

## 2014-10-29 MED ORDER — METRONIDAZOLE 500 MG PO TABS: 500.0000 mg | ORAL_TABLET | Freq: Two times a day (BID) | ORAL | Status: DC

## 2014-10-29 NOTE — Progress Notes (Signed)
Patient ID: Veronica [name], female   DOB: 04-02-1981, 34 y.o.   MRN: [id number] History of Present Illness: [name] is a 34 year old black female, in for well woman gyn exam and pap and complains of cyst in vagina.When asked her periods are heavy at times and may change pad and tampon every 45 minutes and has cramps and craves ice.She is tired all the time and works 3rd shift at [employer] in [place].    Current Medications, Allergies, Past Medical History, Past Surgical History, Family History and Social History were reviewed in [employer] record.     Review of Systems: Patient denies any headaches, hearing loss,  blurred vision, shortness of breath, chest pain, abdominal pain, problems with bowel movements, urination, or intercourse. No joint pain or mood swings.See HPI for positives. She uses condoms and we discussed Plan B.   Physical Exam:BP 120/72 mmHg  Pulse 64  Ht 5' 6.5" (1.689 m)  Wt 179 lb (81.194 kg)  BMI 28.46 kg/m2  LMP 10/12/2014 General:  Well developed, well nourished, no acute distress Skin:  Warm and dry Neck:  Midline trachea, normal thyroid, good ROM, no lymphadenopathy Lungs; Clear to auscultation bilaterally Breast:  No dominant palpable mass, retraction, or nipple discharge Cardiovascular: Regular rate and rhythm Abdomen:  Soft, non tender, no hepatosplenomegaly Pelvic:  External genitalia is normal in appearance, no lesions.  The vagina has creamy discharge with odor, and has bartholin cyst on right, it is no inflamed, and has been I&D in past. Urethra has no lesions or masses. The cervix is bulbous, pap with HPV, GC/CHL perofrmed.  Uterus is felt to be normal size, shape, and contour.  No adnexal masses or tenderness noted.Bladder is non tender, no masses felt.Wet prep was + clue cell and WBC Extremities/musculoskeletal:  No swelling or varicosities noted, no clubbing or cyanosis Psych:  No mood changes, alert and cooperative,seems  happy Leave bartholin cyst alone for now.  Impression: Well woman gyn exam with pap Bartholin cyst Menorrhagia Dysmenorrhea Vaginal discharge BV  Fatigue    Plan: Check CBC,CMP,TSH and lipids Rx flagyl 500 mg 1 bid x 7 days, no alcohol, review handout on BV  Return in 1 week for gyn [place] and see me 2-3 days later Physical in 1 year Review handout on menorrhagia and dysmenorrhea

## 2014-10-29 NOTE — Patient Instructions (Signed)
Fatigue Fatigue is a feeling of tiredness, lack of energy, lack of motivation, or feeling tired all the time. Having enough rest, good nutrition, and reducing stress will normally reduce fatigue. Consult your caregiver if it persists. The nature of your fatigue will help your caregiver to find out its cause. The treatment is based on the cause.  CAUSES  There are many causes for fatigue. Most of the time, fatigue can be traced to one or more of your habits or routines. Most causes fit into one or more of three general areas. They are: Lifestyle problems  Sleep disturbances.  Overwork.  Physical exertion.  Unhealthy habits.  Poor eating habits or eating disorders.  Alcohol and/or drug use .  Lack of proper nutrition (malnutrition). Psychological problems  Stress and/or anxiety problems.  Depression.  Grief.  Boredom. Medical Problems or Conditions  Anemia.  Pregnancy.  Thyroid gland problems.  Recovery from major surgery.  Continuous pain.  Emphysema or asthma that is not well controlled  Allergic conditions.  Diabetes.  Infections (such as mononucleosis).  Obesity.  Sleep disorders, such as sleep apnea.  Heart failure or other heart-related problems.  Cancer.  Kidney disease.  Liver disease.  Effects of certain medicines such as antihistamines, cough and cold remedies, prescription pain medicines, heart and blood pressure medicines, drugs used for treatment of cancer, and some antidepressants. SYMPTOMS  The symptoms of fatigue include:   Lack of energy.  Lack of drive (motivation).  Drowsiness.  Feeling of indifference to the surroundings. DIAGNOSIS  The details of how you feel help guide your caregiver in finding out what is causing the fatigue. You will be asked about your present and past health condition. It is important to review all medicines that you take, including prescription and non-prescription items. A thorough exam will be done.  You will be questioned about your feelings, habits, and normal lifestyle. Your caregiver may suggest blood tests, urine tests, or other tests to look for common medical causes of fatigue.  TREATMENT  Fatigue is treated by correcting the underlying cause. For example, if you have continuous pain or depression, treating these causes will improve how you feel. Similarly, adjusting the dose of certain medicines will help in reducing fatigue.  HOME CARE INSTRUCTIONS   Try to get the required amount of good sleep every night.  Eat a healthy and nutritious diet, and drink enough water throughout the day.  Practice ways of relaxing (including yoga or meditation).  Exercise regularly.  Make plans to change situations that cause stress. Act on those plans so that stresses decrease over time. Keep your work and personal routine reasonable.  Avoid street drugs and minimize use of alcohol.  Start taking a daily multivitamin after consulting your caregiver. SEEK MEDICAL CARE IF:   You have persistent tiredness, which cannot be accounted for.  You have fever.  You have unintentional weight loss.  You have headaches.  You have disturbed sleep throughout the night.  You are feeling sad.  You have constipation.  You have dry skin.  You have gained weight.  You are taking any new or different medicines that you suspect are causing fatigue.  You are unable to sleep at night.  You develop any unusual swelling of your legs or other parts of your body. SEEK IMMEDIATE MEDICAL CARE IF:   You are feeling confused.  Your vision is blurred.  You feel faint or pass out.  You develop severe headache.  You develop severe abdominal, pelvic, or   back pain.  You develop chest pain, shortness of breath, or an irregular or fast heartbeat.  You are unable to pass a normal amount of urine.  You develop abnormal bleeding such as bleeding from the rectum or you vomit blood.  You have thoughts  about harming yourself or committing suicide.  You are worried that you might harm someone else. MAKE SURE YOU:   Understand these instructions.  Will watch your condition.  Will get help right away if you are not doing well or get worse. Document Released: 04/08/2007 Document Revised: 09/03/2011 Document Reviewed: 10/13/2013 [facility] Patient Information 2015 [place], [place]. This information is not intended to replace advice given to you by your health care provider. Make sure you discuss any questions you have with your health care provider. Dysmenorrhea Menstrual cramps (dysmenorrhea) are caused by the muscles of the uterus tightening (contracting) during a menstrual period. For some women, this discomfort is merely bothersome. For others, dysmenorrhea can be severe enough to interfere with everyday activities for a few days each month. Primary dysmenorrhea is menstrual cramps that last a couple of days when you start having menstrual periods or soon after. This often begins after a teenager starts having her period. As a woman gets older or has a baby, the cramps will usually lessen or disappear. Secondary dysmenorrhea begins later in life, lasts longer, and the pain may be stronger than primary dysmenorrhea. The pain may start before the period and last a few days after the period.  CAUSES  Dysmenorrhea is usually caused by an underlying problem, such as:  The tissue lining the uterus grows outside of the uterus in other areas of the body (endometriosis).  The endometrial tissue, which normally lines the uterus, is found in or grows into the muscular walls of the uterus (adenomyosis).  The pelvic blood vessels are engorged with blood just before the menstrual period (pelvic congestive syndrome).  Overgrowth of cells (polyps) in the lining of the uterus or cervix.  Falling down of the uterus (prolapse) because of loose or stretched ligaments.  Depression.  Bladder problems,  infection, or inflammation.  Problems with the intestine, a tumor, or irritable bowel syndrome.  Cancer of the female organs or bladder.  A severely tipped uterus.  A very tight opening or closed cervix.  Noncancerous tumors of the uterus (fibroids).  Pelvic inflammatory disease (PID).  Pelvic scarring (adhesions) from a previous surgery.  Ovarian cyst.  An intrauterine device (IUD) used for birth control. RISK FACTORS You may be at greater risk of dysmenorrhea if:  You are younger than age 56.  You started puberty early.  You have irregular or heavy bleeding.  You have never given birth.  You have a family history of this problem.  You are a smoker. SIGNS AND SYMPTOMS   Cramping or throbbing pain in your lower abdomen.  Headaches.  Lower back pain.  Nausea or vomiting.  Diarrhea.  Sweating or dizziness.  Loose stools. DIAGNOSIS  A diagnosis is based on your history, symptoms, physical exam, diagnostic tests, or procedures. Diagnostic tests or procedures may include:  Blood tests.  Ultrasonography.  An examination of the lining of the uterus (dilation and curettage, D&C).  An examination inside your abdomen or pelvis with a scope (laparoscopy).  X-rays.  CT scan.  MRI.  An examination inside the bladder with a scope (cystoscopy).  An examination inside the intestine or stomach with a scope (colonoscopy, gastroscopy). TREATMENT  Treatment depends on the cause of the dysmenorrhea.  Treatment may include:  Pain medicine prescribed by your health care provider.  Birth control pills or an IUD with progesterone hormone in it.  Hormone replacement therapy.  Nonsteroidal anti-inflammatory drugs (NSAIDs). These may help stop the production of prostaglandins.  Surgery to remove adhesions, endometriosis, ovarian cyst, or fibroids.  Removal of the uterus (hysterectomy).  Progesterone shots to stop the menstrual period.  Cutting the nerves on the  sacrum that go to the female organs (presacral neurectomy).  Electric current to the sacral nerves (sacral nerve stimulation).  Antidepressant medicine.  Psychiatric therapy, counseling, or group therapy.  Exercise and physical therapy.  Meditation and yoga therapy.  Acupuncture. HOME CARE INSTRUCTIONS   Only take over-the-counter or prescription medicines as directed by your health care provider.  Place a heating pad or hot water bottle on your lower back or abdomen. Do not sleep with the heating pad.  Use aerobic exercises, walking, swimming, biking, and other exercises to help lessen the cramping.  Massage to the lower back or abdomen may help.  Stop smoking.  Avoid alcohol and caffeine. SEEK MEDICAL CARE IF:   Your pain does not get better with medicine.  You have pain with sexual intercourse.  Your pain increases and is not controlled with medicines.  You have abnormal vaginal bleeding with your period.  You develop nausea or vomiting with your period that is not controlled with medicine. SEEK IMMEDIATE MEDICAL CARE IF:  You pass out.  Document Released: 06/11/2005 Document Revised: 02/11/2013 Document Reviewed: 11/27/2012 [facility] Patient Information 2015 [place], [place]. This information is not intended to replace advice given to you by your health care provider. Make sure you discuss any questions you have with your health care provider. Menorrhagia Menorrhagia is the medical term for when your menstrual periods are heavy or last longer than usual. With menorrhagia, every period you have may cause enough blood loss and cramping that you are unable to maintain your usual activities. CAUSES  In some cases, the cause of heavy periods is unknown, but a number of conditions may cause menorrhagia. Common causes include:  A problem with the hormone-producing thyroid gland (hypothyroid).  Noncancerous growths in the uterus (polyps or fibroids).  An imbalance of the  estrogen and progesterone hormones.  One of your ovaries not releasing an egg during one or more months.  Side effects of having an intrauterine device (IUD).  Side effects of some medicines, such as anti-inflammatory medicines or blood thinners.  A bleeding disorder that stops your blood from clotting normally. SIGNS AND SYMPTOMS  During a normal period, bleeding lasts between 4 and 8 days. Signs that your periods are too heavy include:  You routinely have to change your pad or tampon every 1 or 2 hours because it is completely soaked.  You pass blood clots larger than 1 inch (2.5 cm) in size.  You have bleeding for more than 7 days.  You need to use pads and tampons at the same time because of heavy bleeding.  You need to wake up to change your pads or tampons during the night.  You have symptoms of anemia, such as tiredness, fatigue, or shortness of breath. DIAGNOSIS  Your health care provider will perform a physical exam and ask you questions about your symptoms and menstrual history. Other tests may be ordered based on what the health care provider finds during the exam. These tests can include:  Blood tests. Blood tests are used to check if you are pregnant or have hormonal  changes, a bleeding or thyroid disorder, low iron levels (anemia), or other problems.  Endometrial biopsy. Your health care provider takes a sample of tissue from the inside of your uterus to be examined under a microscope.  Pelvic ultrasound. This test uses sound waves to make a picture of your uterus, ovaries, and vagina. The pictures can show if you have fibroids or other growths.  Hysteroscopy. For this test, your health care provider will use a small telescope to look inside your uterus. Based on the results of your initial tests, your health care provider may recommend further testing. TREATMENT  Treatment may not be needed. If it is needed, your health care provider may recommend treatment with one  or more medicines first. If these do not reduce bleeding enough, a surgical treatment might be an option. The best treatment for you will depend on:   Whether you need to prevent pregnancy.  Your desire to have children in the future.  The cause and severity of your bleeding.  Your opinion and personal preference.  Medicines for menorrhagia may include:  Birth control methods that use hormones. These include the pill, skin patch, vaginal ring, shots that you get every 3 months, hormonal IUD, and implant. These treatments reduce bleeding during your menstrual period.  Medicines that thicken blood and slow bleeding.  Medicines that reduce swelling, such as ibuprofen.  Medicines that contain a synthetic hormone called progestin.   Medicines that make the ovaries stop working for a short time.  You may need surgical treatment for menorrhagia if the medicines are unsuccessful. Treatment options include:  Dilation and curettage (D&C). In this procedure, your health care provider opens (dilates) your cervix and then scrapes or suctions tissue from the lining of your uterus to reduce menstrual bleeding.  Operative hysteroscopy. This procedure uses a tiny tube with a light (hysteroscope) to view your uterine cavity and can help in the surgical removal of a polyp that may be causing heavy periods.  Endometrial ablation. Through various techniques, your health care provider permanently destroys the entire lining of your uterus (endometrium). After endometrial ablation, most women have little or no menstrual flow. Endometrial ablation reduces your ability to become pregnant.  Endometrial resection. This surgical procedure uses an electrosurgical wire loop to remove the lining of the uterus. This procedure also reduces your ability to become pregnant.  Hysterectomy. Surgical removal of the uterus and cervix is a permanent procedure that stops menstrual periods. Pregnancy is not possible after  a hysterectomy. This procedure requires anesthesia and hospitalization. HOME CARE INSTRUCTIONS   Only take over-the-counter or prescription medicines as directed by your health care provider. Take prescribed medicines exactly as directed. Do not change or switch medicines without consulting your health care provider.  Take any prescribed iron pills exactly as directed by your health care provider. Long-term heavy bleeding may result in low iron levels. Iron pills help replace the iron your body lost from heavy bleeding. Iron may cause constipation. If this becomes a problem, increase the bran, fruits, and roughage in your diet.  Do not take aspirin or medicines that contain aspirin 1 week before or during your menstrual period. Aspirin may make the bleeding worse.  If you need to change your sanitary pad or tampon more than once every 2 hours, stay in bed and rest as much as possible until the bleeding stops.  Eat well-balanced meals. Eat foods high in iron. Examples are leafy green vegetables, meat, liver, eggs, and whole grain breads and  cereals. Do not try to lose weight until the abnormal bleeding has stopped and your blood iron level is back to normal. SEEK MEDICAL CARE IF:   You soak through a pad or tampon every 1 or 2 hours, and this happens every time you have a period.  You need to use pads and tampons at the same time because you are bleeding so much.  You need to change your pad or tampon during the night.  You have a period that lasts for more than 8 days.  You pass clots bigger than 1 inch wide.  You have irregular periods that happen more or less often than once a month.  You feel dizzy or faint.  You feel very weak or tired.  You feel short of breath or feel your heart is beating too fast when you exercise.  You have nausea and vomiting or diarrhea while you are taking your medicine.  You have any problems that may be related to the medicine you are taking. SEEK  IMMEDIATE MEDICAL CARE IF:   You soak through 4 or more pads or tampons in 2 hours.  You have any bleeding while you are pregnant. MAKE SURE YOU:   Understand these instructions.  Will watch your condition.  Will get help right away if you are not doing well or get worse. Document Released: 06/11/2005 Document Revised: 06/16/2013 Document Reviewed: 11/30/2012 [facility] Patient Information 2015 [place], [place]. This information is not intended to replace advice given to you by your health care provider. Make sure you discuss any questions you have with your health care provider. Return in 1 week for [place] then see me 2-3 days later Physical in 1 year Bacterial Vaginosis Bacterial vaginosis is a vaginal infection that occurs when the normal balance of bacteria in the vagina is disrupted. It results from an overgrowth of certain bacteria. This is the most common vaginal infection in women of childbearing age. Treatment is important to prevent complications, especially in pregnant women, as it can cause a premature delivery. CAUSES  Bacterial vaginosis is caused by an increase in harmful bacteria that are normally present in smaller amounts in the vagina. Several different kinds of bacteria can cause bacterial vaginosis. However, the reason that the condition develops is not fully understood. RISK FACTORS Certain activities or behaviors can put you at an increased risk of developing bacterial vaginosis, including:  Having a new sex partner or multiple sex partners.  Douching.  Using an intrauterine device (IUD) for contraception. Women do not get bacterial vaginosis from toilet seats, bedding, swimming pools, or contact with objects around them. SIGNS AND SYMPTOMS  Some women with bacterial vaginosis have no signs or symptoms. Common symptoms include:  Grey vaginal discharge.  A fishlike odor with discharge, especially after sexual intercourse.  Itching or burning of the vagina and  vulva.  Burning or pain with urination. DIAGNOSIS  Your health care provider will take a medical history and examine the vagina for signs of bacterial vaginosis. A sample of vaginal fluid may be taken. Your health care provider will look at this sample under a microscope to check for bacteria and abnormal cells. A vaginal pH test may also be done.  TREATMENT  Bacterial vaginosis may be treated with antibiotic medicines. These may be given in the form of a pill or a vaginal cream. A second round of antibiotics may be prescribed if the condition comes back after treatment.  HOME CARE INSTRUCTIONS   Only take over-the-counter or prescription  medicines as directed by your health care provider.  If antibiotic medicine was prescribed, take it as directed. Make sure you finish it even if you start to feel better.  Do not have sex until treatment is completed.  Tell all sexual partners that you have a vaginal infection. They should see their health care provider and be treated if they have problems, such as a mild rash or itching.  Practice safe sex by using condoms and only having one sex partner. SEEK MEDICAL CARE IF:   Your symptoms are not improving after 3 days of treatment.  You have increased discharge or pain.  You have a fever. MAKE SURE YOU:   Understand these instructions.  Will watch your condition.  Will get help right away if you are not doing well or get worse. FOR MORE INFORMATION  Centers for Disease Control and Prevention, Division of STD Prevention: [employer] American Sexual Health Association (ASHA): www.ashastd.org  Document Released: 06/11/2005 Document Revised: 04/01/2013 Document Reviewed: 01/21/2013 [facility] Patient Information 2015 [place], [place]. This information is not intended to replace advice given to you by your health care provider. Make sure you discuss any questions you have with your health care provider.

## 2014-10-30 LAB — CBC
HEMATOCRIT: 34.9 % (ref 34.0–46.6)
Hemoglobin: 11.6 g/dL (ref 11.1–15.9)
MCH: 28.2 pg (ref 26.6–33.0)
MCHC: 33.2 g/dL (ref 31.5–35.7)
MCV: 85 fL (ref 79–97)
Platelets: 268 10*3/uL (ref 150–379)
RBC: 4.11 x10E6/uL (ref 3.77–5.28)
RDW: 16.7 % — AB (ref 12.3–15.4)
WBC: 6.2 10*3/uL (ref 3.4–10.8)

## 2014-10-30 LAB — LIPID PANEL
Chol/HDL Ratio: 2.7 ratio units (ref 0.0–4.4)
Cholesterol, Total: 216 mg/dL — ABNORMAL HIGH (ref 100–199)
HDL: 81 mg/dL (ref >39)
LDL Calculated: 118 mg/dL — ABNORMAL HIGH (ref 0–99)
TRIGLYCERIDES: 86 mg/dL (ref 0–149)
VLDL CHOLESTEROL CAL: 17 mg/dL (ref 5–40)

## 2014-10-30 LAB — COMPREHENSIVE METABOLIC PANEL
A/G RATIO: 1.4 (ref 1.1–2.5)
ALK PHOS: 51 IU/L (ref 39–117)
ALT: 15 IU/L (ref 0–32)
AST: 20 IU/L (ref 0–40)
Albumin: 4.4 g/dL (ref 3.5–5.5)
BUN / CREAT RATIO: 18 (ref 8–20)
BUN: 12 mg/dL (ref 6–20)
CALCIUM: 9.6 mg/dL (ref 8.7–10.2)
CO2: 23 mmol/L (ref 18–29)
CREATININE: 0.67 mg/dL (ref 0.57–1.00)
Chloride: 100 mmol/L (ref 97–108)
GFR calc Af Amer: 135 mL/min/{1.73_m2} (ref >59)
GFR, EST NON AFRICAN AMERICAN: 117 mL/min/{1.73_m2} (ref >59)
GLOBULIN, TOTAL: 3.2 g/dL (ref 1.5–4.5)
GLUCOSE: 93 mg/dL (ref 65–99)
Potassium: 4.1 mmol/L (ref 3.5–5.2)
Sodium: 139 mmol/L (ref 134–144)
Total Protein: 7.6 g/dL (ref 6.0–8.5)

## 2014-10-30 LAB — TSH: TSH: 1.14 u[IU]/mL (ref 0.450–4.500)

## 2014-11-01 ENCOUNTER — Telehealth: Payer: Self-pay | Admitting: Adult Health

## 2014-11-01 NOTE — Telephone Encounter (Signed)
Pt aware of labs  

## 2014-11-02 LAB — CYTOLOGY - PAP

## 2014-11-03 ENCOUNTER — Telehealth: Payer: Self-pay | Admitting: Adult Health

## 2014-11-03 MED ORDER — AZITHROMYCIN 500 MG PO TABS: ORAL_TABLET | ORAL | Status: DC

## 2014-11-03 NOTE — Telephone Encounter (Signed)
Pt aware of +CHL will rx azithromycin 500 mg #2 2 po now for pt and also called same rx into [facility] for partner steven wilson dob [birth year] [facility] sent

## 2014-11-09 ENCOUNTER — Other Ambulatory Visit: Payer: Medicaid Other

## 2014-11-10 ENCOUNTER — Ambulatory Visit (INDEPENDENT_AMBULATORY_CARE_PROVIDER_SITE_OTHER): Payer: Medicaid Other

## 2014-11-10 DIAGNOSIS — N920 Excessive and frequent menstruation with regular cycle: Secondary | ICD-10-CM | POA: Diagnosis not present

## 2014-11-10 DIAGNOSIS — N946 Dysmenorrhea, unspecified: Secondary | ICD-10-CM | POA: Diagnosis not present

## 2014-11-10 NOTE — Progress Notes (Signed)
US pelvis T/A & TV, normal anteverted uterus, normal ov's bilat,no free fluid seen

## 2014-11-11 ENCOUNTER — Telehealth: Payer: Self-pay | Admitting: Adult Health

## 2014-11-11 NOTE — Telephone Encounter (Signed)
Pt aware US normal  

## 2014-11-15 ENCOUNTER — Ambulatory Visit: Payer: Medicaid Other | Admitting: Adult Health

## 2014-11-16 ENCOUNTER — Ambulatory Visit: Payer: Medicaid Other | Admitting: Adult Health

## 2014-11-17 ENCOUNTER — Ambulatory Visit (INDEPENDENT_AMBULATORY_CARE_PROVIDER_SITE_OTHER): Payer: Medicaid Other | Admitting: Adult Health

## 2014-11-17 ENCOUNTER — Encounter: Payer: Self-pay | Admitting: Adult Health

## 2014-11-17 VITALS — BP 112/70 | HR 68 | Ht 66.0 in | Wt 178.5 lb

## 2014-11-17 DIAGNOSIS — Z8619 Personal history of other infectious and parasitic diseases: Secondary | ICD-10-CM | POA: Diagnosis not present

## 2014-11-17 DIAGNOSIS — N750 Cyst of Bartholin's gland: Secondary | ICD-10-CM

## 2014-11-17 DIAGNOSIS — N920 Excessive and frequent menstruation with regular cycle: Secondary | ICD-10-CM | POA: Diagnosis not present

## 2014-11-17 DIAGNOSIS — N946 Dysmenorrhea, unspecified: Secondary | ICD-10-CM | POA: Diagnosis not present

## 2014-11-17 HISTORY — DX: Personal history of other infectious and parasitic diseases: Z86.19

## 2014-11-17 NOTE — Progress Notes (Signed)
Subjective:     Patient ID: Veronica [name], female   DOB: [birth year], 34 y.o.   MRN: [id number]  HPI Rihanna is a 34 year old black female back in follow up of heavy periods and cramps, she was treated for BV 5/6 when had pap and physical and had +Chlamydia on pap and she and partner were treated 5/11 with azithromycin 500 mg #2.She said last period not as heavy.  Review of Systems  Patient denies any headaches, hearing loss,blurred vision, shortness of breath, chest pain, abdominal pain, problems with bowel movements, urination, or intercourse. No joint pain or mood swings.Is tired but works 3rd shift.See HPI.  Reviewed past medical,surgical, social and family history. Reviewed medications and allergies.     Objective:   Physical Exam BP 112/70 mmHg  Pulse 68  Ht  (1.676 m)  Wt 178 lb 8 oz (80.967 kg)  BMI 28.82 kg/m2  LMP 11/05/2014 (Exact Date) Skin warm and dry.Pelvic: external genitalia is normal in appearance no lesions,has bartholin cyst on right, vagina: white discharge without odor,urethra has no lesions or masses noted, cervix:smooth and bulbous, uterus: normal size, shape and contour, non tender, no masses felt, adnexa: no masses or tenderness noted. Bladder is non tender and no masses felt. Reviewed labs and [place] with pt. Discussed OCs for period control and she declines, so will follow for now, she may want a baby next year.    Assessment:     Menorrhagia Dysmenorrhea Bartholin cyst History of chlamydia      Plan:    Will follow bartholin cyst for now Return 6/13 for proof of treatment for +CHL Take MV with iron or prenatal vitamin No sex til after POT

## 2014-11-17 NOTE — Patient Instructions (Signed)
Take MV with iron or prenatal vitamin Follow up 6/13  No sex

## 2014-12-08 ENCOUNTER — Ambulatory Visit (INDEPENDENT_AMBULATORY_CARE_PROVIDER_SITE_OTHER): Payer: Medicaid Other | Admitting: Nurse Practitioner

## 2014-12-08 ENCOUNTER — Encounter: Payer: Self-pay | Admitting: Nurse Practitioner

## 2014-12-08 ENCOUNTER — Ambulatory Visit (INDEPENDENT_AMBULATORY_CARE_PROVIDER_SITE_OTHER): Payer: Medicaid Other | Admitting: Adult Health

## 2014-12-08 ENCOUNTER — Encounter: Payer: Self-pay | Admitting: Adult Health

## 2014-12-08 VITALS — BP 106/56 | HR 71 | Ht 66.0 in | Wt 183.4 lb

## 2014-12-08 VITALS — BP 104/60 | HR 60 | Ht 66.0 in | Wt 182.0 lb

## 2014-12-08 DIAGNOSIS — Z8619 Personal history of other infectious and parasitic diseases: Secondary | ICD-10-CM

## 2014-12-08 DIAGNOSIS — M545 Low back pain: Secondary | ICD-10-CM | POA: Diagnosis not present

## 2014-12-08 DIAGNOSIS — G8929 Other chronic pain: Secondary | ICD-10-CM | POA: Diagnosis not present

## 2014-12-08 DIAGNOSIS — Z113 Encounter for screening for infections with a predominantly sexual mode of transmission: Secondary | ICD-10-CM

## 2014-12-08 DIAGNOSIS — M47812 Spondylosis without myelopathy or radiculopathy, cervical region: Secondary | ICD-10-CM

## 2014-12-08 MED ORDER — GABAPENTIN 300 MG PO CAPS: ORAL_CAPSULE | ORAL | Status: DC

## 2014-12-08 MED ORDER — AMITRIPTYLINE HCL 10 MG PO TABS: ORAL_TABLET | ORAL | Status: DC
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
## 2014-12-08 NOTE — Patient Instructions (Addendum)
Given a list of neck exercises to perform several times daily Gabapentin 300 mg at night for 4 days then increase to twice daily for 4 days then increase to 3 times a day Amitriptyline 10 mg by mouth daily at bedtime for 1 week then increase to 2 tablets at night for one week then 3 tablets at night Follow-up in 4-6 months

## 2014-12-08 NOTE — Patient Instructions (Signed)
Use condoms Follow up prn 

## 2014-12-08 NOTE — Progress Notes (Signed)
Subjective:     Patient ID: Veronica [name], female   DOB: [birth year], 34 y.o.   MRN: [id number]  HPI Veronica [name] is a 34 year old black female in for proof of treatment for recent +chlamydia on pap.  Review of Systems  Patient denies any headaches, hearing loss, fatigue, blurred vision, shortness of breath, chest pain, abdominal pain, problems with bowel movements, urination, or intercourse. No joint pain or mood swings.  Reviewed past medical,surgical, social and family history. Reviewed medications and allergies.     Objective:   Physical Exam BP 104/60 mmHg  Pulse 60  Ht 5\' 6"  (1.676 m)  Wt 182 lb (82.555 kg)  BMI 29.39 kg/m2  LMP 12/03/2014,Skin warm and dry. Lungs: clear to ausculation bilaterally. Cardiovascular: regular rate and rhythm.She says she and partner took med and she has not had sex since, offered STD testing and she agrees.    Assessment:     History of chlamydia STD screening      Plan:     Check HIV,RPR and HSV2 GC/CHL sent for proof of treatment Follow up prn Use condoms

## 2014-12-08 NOTE — Progress Notes (Signed)
I have read the note, and I agree with the clinical assessment and plan.  WILLIS,CHARLES KEITH   

## 2014-12-09 ENCOUNTER — Ambulatory Visit: Payer: Medicaid Other | Admitting: Nurse Practitioner

## 2014-12-09 LAB — RPR: RPR Ser Ql: NONREACTIVE

## 2014-12-09 LAB — HSV 2 ANTIBODY, IGG

## 2014-12-09 LAB — GC/CHLAMYDIA PROBE AMP
Chlamydia trachomatis, NAA: NEGATIVE
Neisseria gonorrhoeae by PCR: NEGATIVE

## 2014-12-09 LAB — HIV ANTIBODY (ROUTINE TESTING W REFLEX): HIV Screen 4th Generation wRfx: NONREACTIVE

## 2014-12-13 ENCOUNTER — Telehealth: Payer: Self-pay | Admitting: Adult Health

## 2014-12-13 NOTE — Telephone Encounter (Signed)
Left message to call about labs 

## 2014-12-13 NOTE — Telephone Encounter (Signed)
Pt aware of labs and +HSV

## 2014-12-19 ENCOUNTER — Emergency Department (HOSPITAL_COMMUNITY)
Admission: EM | Admit: 2014-12-19 | Discharge: 2014-12-19 | Disposition: A | Discharge status: Home / Self Care | Payer: No Typology Code available for payment source | Attending: Emergency Medicine | Admitting: Emergency Medicine

## 2014-12-19 ENCOUNTER — Encounter (HOSPITAL_COMMUNITY): Payer: Self-pay | Admitting: Emergency Medicine

## 2014-12-19 DIAGNOSIS — Z8619 Personal history of other infectious and parasitic diseases: Secondary | ICD-10-CM | POA: Diagnosis not present

## 2014-12-19 DIAGNOSIS — Z8742 Personal history of other diseases of the female genital tract: Secondary | ICD-10-CM | POA: Insufficient documentation

## 2014-12-19 DIAGNOSIS — Y998 Other external cause status: Secondary | ICD-10-CM | POA: Diagnosis not present

## 2014-12-19 DIAGNOSIS — Z79899 Other long term (current) drug therapy: Secondary | ICD-10-CM | POA: Diagnosis not present

## 2014-12-19 DIAGNOSIS — S39012A Strain of muscle, fascia and tendon of lower back, initial encounter: Secondary | ICD-10-CM | POA: Diagnosis not present

## 2014-12-19 DIAGNOSIS — Z8739 Personal history of other diseases of the musculoskeletal system and connective tissue: Secondary | ICD-10-CM | POA: Insufficient documentation

## 2014-12-19 DIAGNOSIS — Z8709 Personal history of other diseases of the respiratory system: Secondary | ICD-10-CM | POA: Diagnosis not present

## 2014-12-19 DIAGNOSIS — F329 Major depressive disorder, single episode, unspecified: Secondary | ICD-10-CM | POA: Insufficient documentation

## 2014-12-19 DIAGNOSIS — S4991XA Unspecified injury of right shoulder and upper arm, initial encounter: Secondary | ICD-10-CM | POA: Insufficient documentation

## 2014-12-19 DIAGNOSIS — G8929 Other chronic pain: Secondary | ICD-10-CM | POA: Diagnosis not present

## 2014-12-19 DIAGNOSIS — Y92410 Unspecified street and highway as the place of occurrence of the external cause: Secondary | ICD-10-CM | POA: Insufficient documentation

## 2014-12-19 DIAGNOSIS — M62838 Other muscle spasm: Secondary | ICD-10-CM

## 2014-12-19 DIAGNOSIS — Y9389 Activity, other specified: Secondary | ICD-10-CM | POA: Diagnosis not present

## 2014-12-19 DIAGNOSIS — S3992XA Unspecified injury of lower back, initial encounter: Secondary | ICD-10-CM | POA: Diagnosis present

## 2014-12-19 MED ORDER — METHOCARBAMOL 500 MG PO TABS: 500.0000 mg | ORAL_TABLET | Freq: Three times a day (TID) | ORAL | Status: DC | PRN

## 2014-12-19 MED ORDER — IBUPROFEN 800 MG PO TABS: 800.0000 mg | ORAL_TABLET | Freq: Three times a day (TID) | ORAL | Status: DC | PRN

## 2014-12-19 MED ORDER — TRAMADOL HCL 50 MG PO TABS: 50.0000 mg | ORAL_TABLET | Freq: Four times a day (QID) | ORAL | Status: DC | PRN

## 2014-12-19 NOTE — Discharge Instructions (Signed)
Lumbosacral Strain Lumbosacral strain is a strain of any of the parts that make up your lumbosacral vertebrae. Your lumbosacral vertebrae are the bones that make up the lower third of your backbone. Your lumbosacral vertebrae are held together by muscles and tough, fibrous tissue (ligaments).  CAUSES  A sudden blow to your back can cause lumbosacral strain. Also, anything that causes an excessive stretch of the muscles in the low back can cause this strain. This is typically seen when people exert themselves strenuously, fall, lift heavy objects, bend, or crouch repeatedly. RISK FACTORS  Physically demanding work.  Participation in pushing or pulling sports or sports that require a sudden twist of the back (tennis, golf, baseball).  Weight lifting.  Excessive lower back curvature.  Forward-tilted pelvis.  Weak back or abdominal muscles or both.  Tight hamstrings. SIGNS AND SYMPTOMS  Lumbosacral strain may cause pain in the area of your injury or pain that moves (radiates) down your leg.  DIAGNOSIS Your health care provider can often diagnose lumbosacral strain through a physical exam. In some cases, you may need tests such as X-ray exams.  TREATMENT  Treatment for your lower back injury depends on many factors that your clinician will have to evaluate. However, most treatment will include the use of anti-inflammatory medicines. HOME CARE INSTRUCTIONS   Avoid hard physical activities (tennis, racquetball, waterskiing) if you are not in proper physical condition for it. This may aggravate or create problems.  If you have a back problem, avoid sports requiring sudden body movements. Swimming and walking are generally safer activities.  Maintain good posture.  Maintain a healthy weight.  For acute conditions, you may put ice on the injured area.  Put ice in a plastic bag.  Place a towel between your skin and the bag.  Leave the ice on for 20 minutes, 2-3 times a day.  When the  low back starts healing, stretching and strengthening exercises may be recommended. SEEK MEDICAL CARE IF:  Your back pain is getting worse.  You experience severe back pain not relieved with medicines. SEEK IMMEDIATE MEDICAL CARE IF:   You have numbness, tingling, weakness, or problems with the use of your arms or legs.  There is a change in bowel or bladder control.  You have increasing pain in any area of the body, including your belly (abdomen).  You notice shortness of breath, dizziness, or feel faint.  You feel sick to your stomach (nauseous), are throwing up (vomiting), or become sweaty.  You notice discoloration of your toes or legs, or your feet get very cold. MAKE SURE YOU:   Understand these instructions.  Will watch your condition.  Will get help right away if you are not doing well or get worse. Document Released: 03/21/2005 Document Revised: 06/16/2013 Document Reviewed: 01/28/2013 [facility] Patient Information 2015 [place], [place]. This information is not intended to replace advice given to you by your health care provider. Make sure you discuss any questions you have with your health care provider.  Muscle Strain A muscle strain is an injury that occurs when a muscle is stretched beyond its normal length. Usually a small number of muscle fibers are torn when this happens. Muscle strain is rated in degrees. First-degree strains have the least amount of muscle fiber tearing and pain. Second-degree and third-degree strains have increasingly more tearing and pain.  Usually, recovery from muscle strain takes 1-2 weeks. Complete healing takes 5-6 weeks.  CAUSES  Muscle strain happens when a sudden, violent force placed  on a muscle stretches it too far. This may occur with lifting, sports, or a fall.  RISK FACTORS Muscle strain is especially common in athletes.  SIGNS AND SYMPTOMS At the site of the muscle strain, there may  be:  Pain.  Bruising.  Swelling.  Difficulty using the muscle due to pain or lack of normal function. DIAGNOSIS  Your health care provider will perform a physical exam and ask about your medical history. TREATMENT  Often, the best treatment for a muscle strain is resting, icing, and applying cold compresses to the injured area.  HOME CARE INSTRUCTIONS   Use the PRICE method of treatment to promote muscle healing during the first 2-3 days after your injury. The PRICE method involves:  Protecting the muscle from being injured again.  Restricting your activity and resting the injured body part.  Icing your injury. To do this, put ice in a plastic bag. Place a towel between your skin and the bag. Then, apply the ice and leave it on from 15-20 minutes each hour. After the third day, switch to moist heat packs.  Apply compression to the injured area with a splint or elastic bandage. Be careful not to wrap it too tightly. This may interfere with blood circulation or increase swelling.  Elevate the injured body part above the level of your heart as often as you can.  Only take over-the-counter or prescription medicines for pain, discomfort, or fever as directed by your health care provider.  Warming up prior to exercise helps to prevent future muscle strains. SEEK MEDICAL CARE IF:   You have increasing pain or swelling in the injured area.  You have numbness, tingling, or a significant loss of strength in the injured area. MAKE SURE YOU:   Understand these instructions.  Will watch your condition.  Will get help right away if you are not doing well or get worse. Document Released: 06/11/2005 Document Revised: 04/01/2013 Document Reviewed: 01/08/2013 [facility] Patient Information 2015 [place], [place]. This information is not intended to replace advice given to you by your health care provider. Make sure you discuss any questions you have with your health care provider.  Motor  Vehicle Collision It is common to have multiple bruises and sore muscles after a motor vehicle collision (MVC). These tend to feel worse for the first 24 hours. You may have the most stiffness and soreness over the first several hours. You may also feel worse when you wake up the first morning after your collision. After this point, you will usually begin to improve with each day. The speed of improvement often depends on the severity of the collision, the number of injuries, and the location and nature of these injuries. HOME CARE INSTRUCTIONS  Put ice on the injured area.  Put ice in a plastic bag.  Place a towel between your skin and the bag.  Leave the ice on for 15-20 minutes, 3-4 times a day, or as directed by your health care provider.  Drink enough fluids to keep your urine clear or pale yellow. Do not drink alcohol.  Take a warm shower or bath once or twice a day. This will increase blood flow to sore muscles.  You may return to activities as directed by your caregiver. Be careful when lifting, as this may aggravate neck or back pain.  Only take over-the-counter or prescription medicines for pain, discomfort, or fever as directed by your caregiver. Do not use aspirin. This may increase bruising and bleeding. SEEK IMMEDIATE MEDICAL  CARE IF:  You have numbness, tingling, or weakness in the arms or legs.  You develop severe headaches not relieved with medicine.  You have severe neck pain, especially tenderness in the middle of the back of your neck.  You have changes in bowel or bladder control.  There is increasing pain in any area of the body.  You have shortness of breath, light-headedness, dizziness, or fainting.  You have chest pain.  You feel sick to your stomach (nauseous), throw up (vomit), or sweat.  You have increasing abdominal discomfort.  There is blood in your urine, stool, or vomit.  You have pain in your shoulder (shoulder strap areas).  You feel your  symptoms are getting worse. MAKE SURE YOU:  Understand these instructions.  Will watch your condition.  Will get help right away if you are not doing well or get worse. Document Released: 06/11/2005 Document Revised: 10/26/2013 Document Reviewed: 11/08/2010 [facility] Patient Information 2015 [place], [place]. This information is not intended to replace advice given to you by your health care provider. Make sure you discuss any questions you have with your health care provider.  Heat Therapy Heat therapy can help ease sore, stiff, injured, and tight muscles and joints. Heat relaxes your muscles, which may help ease your pain.  RISKS AND COMPLICATIONS If you have any of the following conditions, do not use heat therapy unless your health care provider has approved:  Poor circulation.  Healing wounds or scarred skin in the area being treated.  Diabetes, heart disease, or high blood pressure.  Not being able to feel (numbness) the area being treated.  Unusual swelling of the area being treated.  Active infections.  Blood clots.  Cancer.  Inability to communicate pain. This may include young children and people who have problems with their brain function (dementia).  Pregnancy. Heat therapy should only be used on old, pre-existing, or long-lasting (chronic) injuries. Do not use heat therapy on new injuries unless directed by your health care provider. HOW TO USE HEAT THERAPY There are several different kinds of heat therapy, including:  Moist heat pack.  Warm water bath.  Hot water bottle.  Electric heating pad.  Heated gel pack.  Heated wrap.  Electric heating pad. Use the heat therapy method suggested by your health care provider. Follow your health care provider's instructions on when and how to use heat therapy. GENERAL HEAT THERAPY RECOMMENDATIONS  Do not sleep while using heat therapy. Only use heat therapy while you are awake.  Your skin may turn pink while  using heat therapy. Do not use heat therapy if your skin turns red.  Do not use heat therapy if you have new pain.  High heat or long exposure to heat can cause burns. Be careful when using heat therapy to avoid burning your skin.  Do not use heat therapy on areas of your skin that are already irritated, such as with a rash or sunburn. SEEK MEDICAL CARE IF:  You have blisters, redness, swelling, or numbness.  You have new pain.  Your pain is worse. MAKE SURE YOU:  Understand these instructions.  Will watch your condition.  Will get help right away if you are not doing well or get worse. Document Released: 09/03/2011 Document Revised: 10/26/2013 Document Reviewed: 08/04/2013 [facility] Patient Information 2015 [place], [place]. This information is not intended to replace advice given to you by your health care provider. Make sure you discuss any questions you have with your health care provider.
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
## 2014-12-19 NOTE — ED Notes (Signed)
MD at bedside. 
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
## 2015-04-07 ENCOUNTER — Other Ambulatory Visit: Payer: Self-pay | Admitting: Obstetrics & Gynecology

## 2015-04-07 ENCOUNTER — Encounter: Payer: Self-pay | Admitting: Obstetrics & Gynecology

## 2015-04-07 ENCOUNTER — Ambulatory Visit (INDEPENDENT_AMBULATORY_CARE_PROVIDER_SITE_OTHER): Payer: Medicaid Other | Admitting: Obstetrics & Gynecology

## 2015-04-07 VITALS — BP 100/60 | HR 68 | Wt 178.0 lb

## 2015-04-07 DIAGNOSIS — N751 Abscess of Bartholin's gland: Secondary | ICD-10-CM

## 2015-04-07 DIAGNOSIS — N750 Cyst of Bartholin's gland: Secondary | ICD-10-CM | POA: Diagnosis not present

## 2015-04-07 MED ORDER — CIPROFLOXACIN HCL 500 MG PO TABS: 500.0000 mg | ORAL_TABLET | Freq: Two times a day (BID) | ORAL | Status: DC

## 2015-04-07 NOTE — Progress Notes (Signed)
Patient ID: Veronica [name], female   DOB: [birth year], 34 y.o.   MRN: [id number] Incision and Drainage Procedure Note  Pre-operative Diagnosis: Right Bartholin's cyst/abscess  Post-operative Diagnosis: same  Indications: painful enlarged right Bartholin's cyst  Anesthesia: 0.5% bupivacaine  Procedure Details  The procedure, risks and complications have been discussed in detail (including, but not limited to airway compromise, infection, bleeding) with the patient, and the patient has signed consent to the procedure.  The skin was sterilely prepped and draped over the affected area in the usual fashion. After adequate local anesthesia, I&D with a #11 blade was performed on the right Bartholin's/vulva. Purulent drainage: present.  I then dissected out the firm bartholin's gland.  Closed with 3 3-0 chromic sutures interrupted The patient was observed until stable.  Findings: As above  EBL: 20 cc's  Drains: none  Condition: Tolerated procedure well   Complications: none   Will treat with cipro 500 BID x 7 days.

## 2015-04-07 NOTE — Addendum Note (Signed)
Addended by: Federico Flake A on: 04/07/2015 10:53 AM   Modules accepted: Orders

## 2015-04-11 ENCOUNTER — Telehealth: Payer: Self-pay | Admitting: Obstetrics & Gynecology

## 2015-04-11 ENCOUNTER — Ambulatory Visit: Payer: Medicaid Other | Admitting: Nurse Practitioner

## 2015-04-11 NOTE — Telephone Encounter (Signed)
Pt states had googled the procedure she had done with Dr. Despina Hidden in the office on 04/07/2015 for Bartholin Gland Cyst. Pt states will everything go back to normal and does it affect you having a baby. Pt informed with time the abscess should heal and should not affect infertility. Pt verbalized understanding. Pt has f/u appt 04/21/2015.

## 2015-04-15 ENCOUNTER — Encounter (HOSPITAL_COMMUNITY): Payer: Self-pay | Admitting: Emergency Medicine

## 2015-04-15 ENCOUNTER — Emergency Department (HOSPITAL_COMMUNITY)
Admission: EM | Admit: 2015-04-15 | Discharge: 2015-04-15 | Disposition: A | Discharge status: Other Acute Inpatient Hospital | Payer: Medicaid Other | Attending: Obstetrics & Gynecology | Admitting: Obstetrics & Gynecology

## 2015-04-15 DIAGNOSIS — N99821 Postprocedural hemorrhage and hematoma of a genitourinary system organ or structure following other procedure: Secondary | ICD-10-CM | POA: Diagnosis not present

## 2015-04-15 DIAGNOSIS — N99820 Postprocedural hemorrhage and hematoma of a genitourinary system organ or structure following a genitourinary system procedure: Secondary | ICD-10-CM

## 2015-04-15 LAB — CBC
HEMATOCRIT: 34.3 % — AB (ref 36.0–46.0)
HEMOGLOBIN: 11.9 g/dL — AB (ref 12.0–15.0)
MCH: 30.7 pg (ref 26.0–34.0)
MCHC: 34.7 g/dL (ref 30.0–36.0)
MCV: 88.4 fL (ref 78.0–100.0)
Platelets: 290 10*3/uL (ref 150–400)
RBC: 3.88 MIL/uL (ref 3.87–5.11)
RDW: 12.7 % (ref 11.5–15.5)
WBC: 7.8 10*3/uL (ref 4.0–10.5)

## 2015-04-15 LAB — I-STAT BETA HCG BLOOD, ED (MC, WL, AP ONLY)

## 2015-04-15 MED ORDER — OXYCODONE-ACETAMINOPHEN 5-325 MG PO TABS: 1.0000 | ORAL_TABLET | Freq: Once | ORAL | Status: DC

## 2015-04-15 MED ORDER — OXYCODONE-ACETAMINOPHEN 5-325 MG PO TABS: 1.0000 | ORAL_TABLET | ORAL | Status: DC | PRN

## 2015-04-15 MED ORDER — LIDOCAINE HCL (PF) 2 % IJ SOLN
2.0000 mL | Freq: Once | INTRAMUSCULAR | Status: AC
  Administered 2015-04-15: 2 mL via INTRADERMAL
  Filled 2015-04-15: qty 10

## 2015-04-15 MED ORDER — OXYCODONE-ACETAMINOPHEN 5-325 MG PO TABS
1.0000 | ORAL_TABLET | Freq: Once | ORAL | Status: DC
  Filled 2015-04-15: qty 1

## 2015-04-15 MED ORDER — ONDANSETRON HCL 4 MG/2ML IJ SOLN
4.0000 mg | Freq: Once | INTRAMUSCULAR | Status: AC
  Administered 2015-04-15: 4 mg via INTRAVENOUS
  Filled 2015-04-15: qty 2

## 2015-04-15 MED ORDER — MORPHINE SULFATE (PF) 4 MG/ML IV SOLN
4.0000 mg | Freq: Once | INTRAVENOUS | Status: AC
  Administered 2015-04-15: 4 mg via INTRAVENOUS
  Filled 2015-04-15: qty 1

## 2015-04-15 NOTE — ED Notes (Signed)
After speaking further with patient; based on description of procedure, patient had cyst removed by PCP, not I+D.

## 2015-04-15 NOTE — ED Notes (Signed)
Pelvic cart setup bedside. 

## 2015-04-15 NOTE — ED Provider Notes (Signed)
Pt here to see dr Despina Hidden Pt awake/alert  D/w dr Despina Hidden he will see patient in the ED   Veronica Rhine, MD 04/15/15 [phone]

## 2015-04-15 NOTE — ED Notes (Signed)
MD at bedside. Dr. Despina Hidden

## 2015-04-15 NOTE — ED Provider Notes (Addendum)
CSN: [id number]     Arrival date & time 04/15/15  0457 History   First MD Initiated Contact with Patient 04/15/15 0543     Chief Complaint  Patient presents with  . Vaginal Bleeding     (Consider location/radiation/quality/duration/timing/severity/associated sxs/prior Treatment) HPI Comments: 34 year old female with past medical history including chronic back pain, depression who presents with vaginal bleeding. One week ago, the patient had a bartholin gland cyst excision by her OB/GYN. The procedure went well and she has been recovering at home. Tonight at 3:30, she was walking at work and felt like she had urinated on herself. When she went to the bathroom, she noted that she was bleeding heavily. She then developed sharp, severe pains at the site of the cyst excision and she has continued to have heavy bleeding with passage of clots. She endorses stomach cramping as well as diaphoresis and shaking. No fevers, nausea, vomiting, urinary symptoms, chest pain, or shortness of breath. The pain is severe and constant.  Patient is a 34 y.o. female presenting with vaginal bleeding. The history is provided by the patient.  Vaginal Bleeding   Past Medical History  Diagnosis Date  . Tonsillitis 06/05/2013  . Depression   . ADHD (attention deficit hyperactivity disorder)   . Chronic low back pain 07/22/2014  . Cervical spondylosis without myelopathy 07/22/2014  . Bartholin cyst 10/29/2014  . Menorrhagia 10/29/2014  . Dysmenorrhea 10/29/2014  . Vaginal discharge 10/29/2014  . BV (bacterial vaginosis) 10/29/2014  . Vaginal Pap smear, abnormal   . Fatigue 10/29/2014  . Shoulder pain     right  . History of chlamydia 11/17/2014   Past Surgical History  Procedure Laterality Date  . Lumbar laminectomy    . Tonsillectomy Bilateral 06/06/2013    Procedure: TONSILLECTOMY;  Surgeon: Melvenia Beam, MD;  Location: [facility] OR;  Service: ENT;  Laterality: Bilateral;  . Cryotherapy     Family History  Problem  Relation Age of Onset  . Hypertension Mother   . Diabetes Mother   . Anxiety disorder Mother   . Hypertension Father   . Hypertension Sister   . Diabetes Maternal Grandmother   . Hypertension Maternal Grandmother   . Hypertension Maternal Grandfather   . Diabetes Paternal Grandmother   . Hypertension Paternal Grandmother   . Hypertension Paternal Grandfather    Social History  Substance Use Topics  . Smoking status: Never Smoker   . Smokeless tobacco: Never Used  . Alcohol Use: No   OB History    Gravida Para Term Preterm AB TAB SAB Ectopic Multiple Living   3 2   1  1   2      Review of Systems  Genitourinary: Positive for vaginal bleeding.    10 Systems reviewed and are negative for acute change except as noted in the HPI.   Allergies  Review of patient's allergies indicates no known allergies.  Home Medications   Prior to Admission medications   Medication Sig Start Date End Date Taking? Authorizing Provider  ciprofloxacin (CIPRO) 500 MG tablet Take 1 tablet (500 mg total) by mouth 2 (two) times daily. 04/07/15  Yes Lazaro Arms, MD  amitriptyline (ELAVIL) 10 MG tablet 1 po hs for 1 week then 2 po hs for 1 week then 3 po hs Patient not taking: Reported on 04/07/2015 12/08/14   Nilda Riggs, NP  gabapentin (NEURONTIN) 300 MG capsule 1 po hs for 4 days then 1 twice daily  For 4 days then 1 three  times daily Patient not taking: Reported on 04/07/2015 12/08/14   Nilda Riggs, NP  ibuprofen (ADVIL,MOTRIN) 800 MG tablet Take 1 tablet (800 mg total) by mouth every 8 (eight) hours as needed for mild pain. Patient not taking: Reported on 04/07/2015 12/19/14   Layla Maw Ward, DO  methocarbamol (ROBAXIN) 500 MG tablet Take 1 tablet (500 mg total) by mouth every 8 (eight) hours as needed for muscle spasms. Patient not taking: Reported on 04/07/2015 12/19/14   Layla Maw Ward, DO  traMADol (ULTRAM) 50 MG tablet Take 1 tablet (50 mg total) by mouth every 6 (six) hours  as needed. Patient not taking: Reported on 04/07/2015 12/19/14   Kristen N Ward, DO   BP 118/59 mmHg  Pulse 67  Temp(Src) 97.9 F (36.6 C)  Resp 16  Ht  (1.676 m)  Wt 165 lb (74.844 kg)  BMI 26.64 kg/m2  SpO2 99%  LMP 03/30/2015 (Exact Date) Physical Exam  Constitutional: She is oriented to person, place, and time. She appears well-developed and well-nourished.  In mild distress due to pain, trembling  HENT:  Head: Normocephalic and atraumatic.  Moist mucous membranes  Eyes: Conjunctivae are normal. Pupils are equal, round, and reactive to light.  Neck: Neck supple.  Cardiovascular: Normal rate, regular rhythm and normal heart sounds.   No murmur heard. Pulmonary/Chest: Effort normal and breath sounds normal.  Abdominal: Soft. Bowel sounds are normal. She exhibits no distension. There is no tenderness.  Genitourinary:  2cm excision site on R lower labia majora with large clot, surrounding area firm to palpation and tender; venous oozing from excision site  Musculoskeletal: She exhibits no edema.  Neurological: She is alert and oriented to person, place, and time.  Fluent speech  Skin: Skin is warm and dry.  Psychiatric: She has a normal mood and affect. Judgment normal.  Nursing note and vitals reviewed.   Chaperone was present during exam.  ED Course  Procedures (including critical care time) Labs Review Labs Reviewed  CBC - Abnormal; Notable for the following:    Hemoglobin 11.9 (*)    HCT 34.3 (*)    All other components within normal limits  I-STAT BETA HCG BLOOD, ED ([facility], [facility], [facility] ONLY)    Imaging Review No results found. I have personally reviewed and evaluated these lab results as part of my medical decision-making.   EKG Interpretation None     Medications  morphine 4 MG/ML injection 4 mg (4 mg Intravenous Given 04/15/15 0645)  ondansetron (ZOFRAN) injection 4 mg (4 mg Intravenous Given 04/15/15 0645)    MDM   Final diagnoses:  Postoperative  hemorrhage involving genitourinary system following genitourinary procedure    34 year old female who presents with vaginal bleeding and pain 1 week after having Bartholin glands cyst excision. Patient uncomfortable at presentation. No abdominal tenderness noted. She had a large clot present at the site of the cyst excision with significant tenderness and induration around the wound. The evacuated clot and noted venous oozing from excision site. I spoke with the patient's OB/GYN, Dr. Despina Hidden, regarding the patient's presentation. He has recommended transfer to [facility], where he can evaluate the patient in the ED. I have spoken with Dr. Bebe Shaggy in ED who has accepted patient transfer. Wound packed and patient given morphine for pain. Patient in agreement with plan and will be transferred to [name] for OB/GYN evaluation.  Laurence Spates, MD 04/15/15 1610  Laurence Spates, MD 04/15/15 2119

## 2015-04-15 NOTE — ED Notes (Signed)
Patient here with complaint of vaginal bleeding. States recent Bartholin cyst with recent I+D by PCP. Patient states mild bleeding since time of drainage about 1 week ago. This AM suddenly bleeding became heavy and patient noted clots. Also endorses simultaneous onset of severe pain.

## 2015-04-15 NOTE — ED Notes (Signed)
Assumed care of patient from [facility].  Pt resting comfortably.

## 2015-04-15 NOTE — Discharge Instructions (Signed)
Bartholin Cyst or Abscess A Bartholin cyst is a fluid-filled sac that forms on a Bartholin gland. Bartholin glands are small glands that are located within the folds of skin (labia) along the sides of the lower opening of the vagina. These glands produce a fluid to moisten the outside of the vagina during sexual intercourse. A Bartholin cyst causes a bulge on the side of the vagina. A cyst that is not large or infected may not cause symptoms or problems. However, if the fluid within the cyst becomes infected, the cyst can turn into an abscess. An abscess may cause discomfort or pain. CAUSES A Bartholin cyst may develop when the duct of the gland becomes blocked. In many cases, the cause of this is not known. Various kinds of bacteria can cause the cyst to become infected and develop into an abscess. RISK FACTORS You may be at an increased risk of developing a Bartholin cyst or abscess if:  You are a woman of reproductive age.  You have a history of previous Bartholin cysts or abscesses.  You have diabetes.  You have a sexually transmitted disease (STD). SIGNS AND SYMPTOMS The severity of symptoms varies depending on the size of the cyst and whether it is infected. Symptoms may include:  A bulge or swelling near the lower opening of your vagina.  Discomfort or pain.  Redness.  Pain during sexual intercourse.  Pain when walking.  Fluid draining from the area. DIAGNOSIS Your health care provider may make a diagnosis based on your symptoms and a physical exam. He or she will look for swelling in your vaginal area. Blood tests may be done to check for infections. A sample of fluid from the cyst or abscess may also be taken to be tested in a lab. TREATMENT Small cysts that are not infected may not require any treatment. These often go away on their own. Yourhealth care provider will recommend hot baths and the use of warm compresses. These may also be part of the treatment for an abscess.  Treatment options for a large cyst or abscess may include:   Antibiotic medicine.  A surgical procedure to drain the abscess. One of the following procedures may be done:  Incision and drainage. An incision is made in the cyst or abscess so that the fluid drains out. A catheter may be placed inside the cyst so that it does not close and fill up with fluid again. The catheter will be removed after you have a follow-up visit with a specialist (gynecologist).  Marsupialization. The cyst or abscess is opened and kept open by stitching the edges of the skin to the walls of the cyst or abscess. This allows it to continue to drain and not fill up with fluid again. If you have cysts or abscesses that keep returning and have required incision and drainage multiple times, your health care provider may talk to you about surgery to remove the Bartholin gland. HOME CARE INSTRUCTIONS  Take medicines only as directed by your health care provider.  If you were prescribed an antibiotic medicine, finish it all even if you start to feel better.  Apply warm, wet compresses to the area or take warm, shallow baths that cover your pelvic region (sitz baths) several times a day or as directed by your health care provider.  Do not squeeze the cyst or apply heavy pressure to it.  Do not have sexual intercourse until the cyst has gone away.  If your cyst or abscess was   opened, a small piece of gauze or a drain may have been placed in the area to allow drainage. Do not remove the gauze or the drain until directed by your health care provider.  Wear feminine pads--not tampons--as needed for any drainage or bleeding.  Keep all follow-up visits as directed by your health care provider. This is important. PREVENTION Take these steps to help prevent a Bartholin cyst from returning:  Practice good hygiene.   Clean your vaginal area with mild soap and a soft cloth when you bathe.  Practice safe sex to prevent  STDs. SEEK MEDICAL CARE IF:  You have increased pain, swelling, or redness in the area of the cyst.  Puslike drainage is coming from the cyst.  You have a fever.   This information is not intended to replace advice given to you by your health care provider. Make sure you discuss any questions you have with your health care provider.   Document Released: 06/11/2005 Document Revised: 07/02/2014 Document Reviewed: 01/25/2014 Elsevier Interactive Patient Education 2016 [employer]  

## 2015-04-18 ENCOUNTER — Emergency Department (HOSPITAL_COMMUNITY)
Admission: EM | Admit: 2015-04-18 | Discharge: 2015-04-18 | Disposition: A | Discharge status: Home / Self Care | Payer: Medicaid Other | Attending: Emergency Medicine | Admitting: Emergency Medicine

## 2015-04-18 ENCOUNTER — Encounter (HOSPITAL_COMMUNITY): Payer: Self-pay | Admitting: Emergency Medicine

## 2015-04-18 DIAGNOSIS — Z8739 Personal history of other diseases of the musculoskeletal system and connective tissue: Secondary | ICD-10-CM | POA: Diagnosis not present

## 2015-04-18 DIAGNOSIS — Z8619 Personal history of other infectious and parasitic diseases: Secondary | ICD-10-CM | POA: Diagnosis not present

## 2015-04-18 DIAGNOSIS — Y998 Other external cause status: Secondary | ICD-10-CM | POA: Insufficient documentation

## 2015-04-18 DIAGNOSIS — W540XXA Bitten by dog, initial encounter: Secondary | ICD-10-CM | POA: Insufficient documentation

## 2015-04-18 DIAGNOSIS — Y9289 Other specified places as the place of occurrence of the external cause: Secondary | ICD-10-CM | POA: Diagnosis not present

## 2015-04-18 DIAGNOSIS — S71111A Laceration without foreign body, right thigh, initial encounter: Secondary | ICD-10-CM | POA: Diagnosis not present

## 2015-04-18 DIAGNOSIS — Z8742 Personal history of other diseases of the female genital tract: Secondary | ICD-10-CM | POA: Diagnosis not present

## 2015-04-18 DIAGNOSIS — Z8709 Personal history of other diseases of the respiratory system: Secondary | ICD-10-CM | POA: Insufficient documentation

## 2015-04-18 DIAGNOSIS — S61236A Puncture wound without foreign body of right little finger without damage to nail, initial encounter: Secondary | ICD-10-CM | POA: Diagnosis not present

## 2015-04-18 DIAGNOSIS — T148XXA Other injury of unspecified body region, initial encounter: Secondary | ICD-10-CM

## 2015-04-18 DIAGNOSIS — Z23 Encounter for immunization: Secondary | ICD-10-CM | POA: Insufficient documentation

## 2015-04-18 DIAGNOSIS — Y9389 Activity, other specified: Secondary | ICD-10-CM | POA: Insufficient documentation

## 2015-04-18 DIAGNOSIS — Z792 Long term (current) use of antibiotics: Secondary | ICD-10-CM | POA: Insufficient documentation

## 2015-04-18 DIAGNOSIS — S79921A Unspecified injury of right thigh, initial encounter: Secondary | ICD-10-CM | POA: Diagnosis present

## 2015-04-18 DIAGNOSIS — F329 Major depressive disorder, single episode, unspecified: Secondary | ICD-10-CM | POA: Insufficient documentation

## 2015-04-18 DIAGNOSIS — G8929 Other chronic pain: Secondary | ICD-10-CM | POA: Insufficient documentation

## 2015-04-18 DIAGNOSIS — S61431A Puncture wound without foreign body of right hand, initial encounter: Secondary | ICD-10-CM | POA: Insufficient documentation

## 2015-04-18 MED ORDER — TETANUS-DIPHTH-ACELL PERTUSSIS 5-2.5-18.5 LF-MCG/0.5 IM SUSP
0.5000 mL | Freq: Once | INTRAMUSCULAR | Status: AC
  Administered 2015-04-18: 0.5 mL via INTRAMUSCULAR

## 2015-04-18 MED ORDER — HYDROCODONE-ACETAMINOPHEN 5-325 MG PO TABS
1.0000 | ORAL_TABLET | Freq: Once | ORAL | Status: AC
  Administered 2015-04-18: 1 via ORAL

## 2015-04-18 MED ORDER — TETANUS-DIPHTH-ACELL PERTUSSIS 5-2.5-18.5 LF-MCG/0.5 IM SUSP
INTRAMUSCULAR | Status: AC
  Filled 2015-04-18: qty 0.5

## 2015-04-18 MED ORDER — IBUPROFEN 800 MG PO TABS
800.0000 mg | ORAL_TABLET | Freq: Once | ORAL | Status: AC
  Administered 2015-04-18: 800 mg via ORAL

## 2015-04-18 MED ORDER — IBUPROFEN 800 MG PO TABS
ORAL_TABLET | ORAL | Status: AC
  Filled 2015-04-18: qty 1

## 2015-04-18 MED ORDER — HYDROCODONE-ACETAMINOPHEN 5-325 MG PO TABS
ORAL_TABLET | ORAL | Status: DC
Start: 2015-04-18 — End: 2015-04-18
  Filled 2015-04-18: qty 1

## 2015-04-18 MED ORDER — LIDOCAINE HCL (PF) 2 % IJ SOLN: 10.0000 mL | Freq: Once | INTRAMUSCULAR | Status: DC

## 2015-04-18 MED ORDER — AMOXICILLIN-POT CLAVULANATE 875-125 MG PO TABS
1.0000 | ORAL_TABLET | Freq: Two times a day (BID) | ORAL | Status: DC
Start: 2015-04-18 — End: 2015-05-11

## 2015-04-18 MED ORDER — IBUPROFEN 800 MG PO TABS: 800.0000 mg | ORAL_TABLET | Freq: Three times a day (TID) | ORAL | Status: DC

## 2015-04-18 MED ORDER — LIDOCAINE HCL (PF) 2 % IJ SOLN
INTRAMUSCULAR | Status: AC
  Filled 2015-04-18: qty 10

## 2015-04-18 MED ORDER — AMOXICILLIN-POT CLAVULANATE 875-125 MG PO TABS
1.0000 | ORAL_TABLET | Freq: Once | ORAL | Status: AC
  Administered 2015-04-18: 1 via ORAL
  Filled 2015-04-18: qty 1

## 2015-04-18 MED ORDER — LIDOCAINE HCL (PF) 1 % IJ SOLN
INTRAMUSCULAR | Status: AC
  Filled 2015-04-18: qty 5

## 2015-04-18 NOTE — ED Notes (Signed)
Bite by own pit bull dog to right upper leg.  Open area note without bleeding.  Small opening to right hand (palm).

## 2015-04-18 NOTE — ED Provider Notes (Signed)
CSN: [id number]     Arrival date & time 04/18/15  4098 History   First MD Initiated Contact with Patient 04/18/15 0805     Chief Complaint  Patient presents with  . Animal Bite     (Consider location/radiation/quality/duration/timing/severity/associated sxs/prior Treatment) HPI   Veronica [name] is a 34 y.o. female who presents to the Emergency Department complaining of a dog bite to her right thigh.  She is the owner of the dog and states she was trying to separate her two dogs that were fighting when the injury occurred.  She complains of "soreness" to her thigh with movement.  Incident occurred just prior to ED arrival.  She has not tried any medications or cleaned the wound.  She denies numbness, excessive bleeding, swelling, or difficulty with movement.  She states the animal is up to date on vaccinations, due in December.  She isn't sure of her last Td.  She also denies any blood thinners.     Past Medical History  Diagnosis Date  . Tonsillitis 06/05/2013  . Depression   . ADHD (attention deficit hyperactivity disorder)   . Chronic low back pain 07/22/2014  . Cervical spondylosis without myelopathy 07/22/2014  . Bartholin cyst 10/29/2014  . Menorrhagia 10/29/2014  . Dysmenorrhea 10/29/2014  . Vaginal discharge 10/29/2014  . BV (bacterial vaginosis) 10/29/2014  . Vaginal Pap smear, abnormal   . Fatigue 10/29/2014  . Shoulder pain     right  . History of chlamydia 11/17/2014   Past Surgical History  Procedure Laterality Date  . Lumbar laminectomy    . Tonsillectomy Bilateral 06/06/2013    Procedure: TONSILLECTOMY;  Surgeon: Melvenia Beam, MD;  Location: [facility] OR;  Service: ENT;  Laterality: Bilateral;  . Cryotherapy     Family History  Problem Relation Age of Onset  . Hypertension Mother   . Diabetes Mother   . Anxiety disorder Mother   . Hypertension Father   . Hypertension Sister   . Diabetes Maternal Grandmother   . Hypertension Maternal Grandmother   . Hypertension Maternal  Grandfather   . Diabetes Paternal Grandmother   . Hypertension Paternal Grandmother   . Hypertension Paternal Grandfather    Social History  Substance Use Topics  . Smoking status: Never Smoker   . Smokeless tobacco: Never Used  . Alcohol Use: No   OB History    Gravida Para Term Preterm AB TAB SAB Ectopic Multiple Living   Review of Systems  Constitutional: Negative for fever and chills.  Musculoskeletal: Negative for back pain, joint swelling and arthralgias.  Skin: Positive for wound.       Laceration   Neurological: Negative for dizziness, weakness and numbness.  Hematological: Does not bruise/bleed easily.  All other systems reviewed and are negative.     Allergies  Review of patient's allergies indicates no known allergies.  Home Medications   Prior to Admission medications   Medication Sig Start Date End Date Taking? Authorizing Provider  amitriptyline (ELAVIL) 10 MG tablet 1 po hs for 1 week then 2 po hs for 1 week then 3 po hs Patient not taking: Reported on 04/07/2015 12/08/14   Nilda Riggs, NP  ciprofloxacin (CIPRO) 500 MG tablet Take 1 tablet (500 mg total) by mouth 2 (two) times daily. 04/07/15   Lazaro Arms, MD  gabapentin (NEURONTIN) 300 MG capsule 1 po hs for 4 days then 1 twice daily  For 4 days then 1 three times daily Patient not taking: Reported on 04/07/2015 12/08/14   Nilda Riggs, NP  ibuprofen (ADVIL,MOTRIN) 800 MG tablet Take 1 tablet (800 mg total) by mouth every 8 (eight) hours as needed for mild pain. Patient not taking: Reported on 04/07/2015 12/19/14   Layla Maw Ward, DO  methocarbamol (ROBAXIN) 500 MG tablet Take 1 tablet (500 mg total) by mouth every 8 (eight) hours as needed for muscle spasms. Patient not taking: Reported on 04/07/2015 12/19/14   Layla Maw Ward, DO  oxyCODONE-acetaminophen (ROXICET) 5-325 MG tablet Take 1-2 tablets by mouth every 4 (four) hours as needed for severe pain. 04/15/15   Lazaro Arms, MD  traMADol (ULTRAM) 50 MG tablet Take 1 tablet (50 mg total) by mouth every 6 (six) hours as needed. Patient not taking: Reported on 04/07/2015 12/19/14   Kristen N Ward, DO   BP 125/75 mmHg  Pulse 96  Temp(Src) 98.6 F (37 C) (Oral)  Resp 18  Ht  (1.676 m)  Wt 165 lb (74.844 kg)  BMI 26.64 kg/m2  SpO2 100%  LMP 03/30/2015 (Exact Date) Physical Exam  Constitutional: She is oriented to person, place, and time. She appears well-developed and well-nourished. No distress.  HENT:  Head: Normocephalic and atraumatic.  Cardiovascular: Normal rate, regular rhythm, normal heart sounds and intact distal pulses.   No murmur heard. Pulmonary/Chest: Effort normal and breath sounds normal. No respiratory distress.  Musculoskeletal: She exhibits no edema or tenderness.  Neurological: She is alert and oriented to person, place, and time. She exhibits normal muscle tone. Coordination normal.  Skin: Skin is warm. Laceration noted.  Flap type laceration to the anterior right thigh.  No bleeding or edema.  Pt also has two small punctures to the right fifth finger and distal palm.  She has full ROM of the fingers, sensation intact, CR<2 sec.    Nursing note and vitals reviewed.   ED Course  Procedures (including critical care time)  LACERATION REPAIR Performed by: Adylynn Hertenstein L. Authorized by: Maxwell Caul Consent: Verbal consent obtained. Risks and benefits: risks, benefits and alternatives were discussed Consent given by: patient Patient identity confirmed: provided demographic data Prepped and Draped in normal sterile fashion Wound explored  Laceration Location: right thigh  Laceration Length: 3 x 2 cm  No Foreign Bodies seen or palpated  Anesthesia: local infiltration  Local anesthetic: lidocaine 2 % w/o epinephrine  Anesthetic total: 3 ml  Irrigation method: syringe Amount of cleaning: standard  Skin closure: 4-0 prolene  Number of sutures:  5  Technique: simple interrupted, loosely approximated  Patient tolerance: Patient tolerated the procedure well with no immediate complications.  Abrasions to right fifth finger and hand were irrigated by me with saline and explored, wounds appear superficial.     MDM   Final diagnoses:  Animal bite    Td updated, laceration to the leg was closed with loose sutures.  Dog is up to date on vaccinations.  Pt agrees to proper wound care instructions and close f/u and return for any signs of infection.  Rx's for augmentin and #10 vicodin     Pauline Aus, PA-C 04/20/15 1707  Blane Ohara, MD 04/22/15 [phone]

## 2015-04-18 NOTE — ED Notes (Signed)
Dogs rabies shot is due in December.

## 2015-04-20 ENCOUNTER — Ambulatory Visit (INDEPENDENT_AMBULATORY_CARE_PROVIDER_SITE_OTHER): Payer: Medicaid Other | Admitting: Obstetrics & Gynecology

## 2015-04-20 ENCOUNTER — Encounter: Payer: Self-pay | Admitting: Obstetrics & Gynecology

## 2015-04-20 VITALS — BP 120/60 | HR 84 | Wt 178.0 lb

## 2015-04-20 DIAGNOSIS — N751 Abscess of Bartholin's gland: Secondary | ICD-10-CM | POA: Diagnosis not present

## 2015-04-20 NOTE — Progress Notes (Signed)
Patient ID: Veronica [name], female   DOB: 05/05/1981, 34 y.o.   MRN: [id number] Pt is in for recheck  She had a right Bartholin's gland abscess/cyst which I excised 10/13 Subsequently on 10/21 she presented to ED with bleeding from the surgical area  I evaluated the patient and put in 3 "insurance" sutures but by the time I saw her there was no bleeding at all, about a 1x1 cm clot in the Bartholin's fossa  Today she states she has had no mre problems no bleeding no pain  Exam Sutures in place not disrupted healing well, no signs of infection  Follow up 3 weeks for re evaluation  Return if problems come back     Face to face time:  10 minutes  Greater than 50% of the visit time was spent in counseling and coordination of care with the patient.  The summary and outline of the counseling and care coordination is summarized in the note above.   All questions were answered.

## 2015-04-21 ENCOUNTER — Ambulatory Visit: Payer: Medicaid Other | Admitting: Obstetrics & Gynecology

## 2015-04-22 ENCOUNTER — Emergency Department (HOSPITAL_COMMUNITY)
Admission: EM | Admit: 2015-04-22 | Discharge: 2015-04-22 | Disposition: A | Discharge status: Home / Self Care | Payer: Medicaid Other

## 2015-05-02 ENCOUNTER — Encounter (HOSPITAL_COMMUNITY): Payer: Self-pay | Admitting: Emergency Medicine

## 2015-05-02 ENCOUNTER — Emergency Department (HOSPITAL_COMMUNITY)
Admission: EM | Admit: 2015-05-02 | Discharge: 2015-05-02 | Disposition: A | Discharge status: Home / Self Care | Payer: Medicaid Other | Attending: Emergency Medicine | Admitting: Emergency Medicine

## 2015-05-02 DIAGNOSIS — G8929 Other chronic pain: Secondary | ICD-10-CM | POA: Insufficient documentation

## 2015-05-02 DIAGNOSIS — Z791 Long term (current) use of non-steroidal anti-inflammatories (NSAID): Secondary | ICD-10-CM | POA: Insufficient documentation

## 2015-05-02 DIAGNOSIS — Z4802 Encounter for removal of sutures: Secondary | ICD-10-CM | POA: Diagnosis not present

## 2015-05-02 DIAGNOSIS — Z8742 Personal history of other diseases of the female genital tract: Secondary | ICD-10-CM | POA: Insufficient documentation

## 2015-05-02 DIAGNOSIS — Z8659 Personal history of other mental and behavioral disorders: Secondary | ICD-10-CM | POA: Insufficient documentation

## 2015-05-02 DIAGNOSIS — Z8709 Personal history of other diseases of the respiratory system: Secondary | ICD-10-CM | POA: Diagnosis not present

## 2015-05-02 DIAGNOSIS — Z8619 Personal history of other infectious and parasitic diseases: Secondary | ICD-10-CM | POA: Diagnosis not present

## 2015-05-02 NOTE — ED Provider Notes (Signed)
CSN: [id number]     Arrival date & time 05/02/15  6213 History   First MD Initiated Contact with Patient 05/02/15 0845     Chief Complaint  Patient presents with  . Suture / Staple Removal     (Consider location/radiation/quality/duration/timing/severity/associated sxs/prior Treatment) HPI   Veronica [name] is a 34 y.o. female who presents to the Emergency Department requesting suture removal.  She was seen here 04/18/15 for a laceration to her right thigh after a dog bite.  She reports to wound is healing nicely and she has completed her antibiotics.  She denies pain, redness, drainage or swelling.     Past Medical History  Diagnosis Date  . Tonsillitis 06/05/2013  . Depression   . ADHD (attention deficit hyperactivity disorder)   . Chronic low back pain 07/22/2014  . Cervical spondylosis without myelopathy 07/22/2014  . Bartholin cyst 10/29/2014  . Menorrhagia 10/29/2014  . Dysmenorrhea 10/29/2014  . Vaginal discharge 10/29/2014  . BV (bacterial vaginosis) 10/29/2014  . Vaginal Pap smear, abnormal   . Fatigue 10/29/2014  . Shoulder pain     right  . History of chlamydia 11/17/2014   Past Surgical History  Procedure Laterality Date  . Lumbar laminectomy    . Tonsillectomy Bilateral 06/06/2013    Procedure: TONSILLECTOMY;  Surgeon: Melvenia Beam, MD;  Location: [facility] OR;  Service: ENT;  Laterality: Bilateral;  . Cryotherapy     Family History  Problem Relation Age of Onset  . Hypertension Mother   . Diabetes Mother   . Anxiety disorder Mother   . Hypertension Father   . Hypertension Sister   . Diabetes Maternal Grandmother   . Hypertension Maternal Grandmother   . Hypertension Maternal Grandfather   . Diabetes Paternal Grandmother   . Hypertension Paternal Grandmother   . Hypertension Paternal Grandfather    Social History  Substance Use Topics  . Smoking status: Never Smoker   . Smokeless tobacco: Never Used  . Alcohol Use: No   OB History    Gravida Para Term Preterm AB  TAB SAB Ectopic Multiple Living   Review of Systems  Constitutional: Negative for fever and chills.  Musculoskeletal: Negative for back pain, joint swelling and arthralgias.  Skin: Positive for wound.       Dog bite to right thigh  Neurological: Negative for dizziness, weakness and numbness.  Hematological: Does not bruise/bleed easily.  All other systems reviewed and are negative.     Allergies  Review of patient's allergies indicates no known allergies.  Home Medications   Prior to Admission medications   Medication Sig Start Date End Date Taking? Authorizing Provider  amoxicillin-clavulanate (AUGMENTIN) 875-125 MG tablet Take 1 tablet by mouth every 12 (twelve) hours. For 7 days Patient not taking: Reported on 04/20/2015 04/18/15   Seaver Machia, PA-C  ibuprofen (ADVIL,MOTRIN) 800 MG tablet Take 1 tablet (800 mg total) by mouth 3 (three) times daily. 04/18/15   Donnovan Stamour, PA-C  oxyCODONE-acetaminophen (ROXICET) 5-325 MG tablet Take 1-2 tablets by mouth every 4 (four) hours as needed for severe pain. 04/15/15   Lazaro Arms, MD   BP 132/82 mmHg  Pulse 87  Temp(Src) 98.4 F (36.9 C) (Oral)  Resp 18  Ht  (1.676 m)  Wt 172 lb (78.019 kg)  BMI 27.77 kg/m2  SpO2 100%  LMP 04/21/2015 Physical Exam  Constitutional: She is oriented to person, place, and time. She  appears well-developed and well-nourished. No distress.  HENT:  Head: Normocephalic and atraumatic.  Cardiovascular: Normal rate, regular rhythm and intact distal pulses.   No murmur heard. Pulmonary/Chest: Effort normal. No respiratory distress.  Musculoskeletal: She exhibits no edema or tenderness.  Neurological: She is alert and oriented to person, place, and time. She exhibits normal muscle tone. Coordination normal.  Skin: Skin is warm. Laceration noted.  Laceration to the right mid thigh, healing well.  Suture line intact.  No surrounding erythema, edema or drainage.  NT   Nursing note and vitals reviewed.   ED Course  Procedures (including critical care time)   Procedure:  Laceration to right mid thigh.  5 sutures in place.  Appears to be healing well. Area cleaned and sutures removed by me without difficulty.  No dehiscence    MDM   Final diagnoses:  Visit for suture removal    Laceration is healing well.  No signs of infection.  NV intact.  Wound care instructions discussed.      Pauline Aus, PA-C 05/02/15 2952  Bethann Berkshire, MD 05/05/15 [phone]

## 2015-05-02 NOTE — ED Notes (Signed)
Suture removal to right thigh

## 2015-05-02 NOTE — ED Notes (Signed)
Pt made aware to return if symptoms worsen or if any life threatening symptoms occur.   

## 2015-05-02 NOTE — Discharge Instructions (Signed)

## 2015-05-11 ENCOUNTER — Encounter: Payer: Self-pay | Admitting: Obstetrics & Gynecology

## 2015-05-11 ENCOUNTER — Ambulatory Visit (INDEPENDENT_AMBULATORY_CARE_PROVIDER_SITE_OTHER): Payer: Medicaid Other | Admitting: Obstetrics & Gynecology

## 2015-05-11 VITALS — BP 110/80 | HR 74 | Wt 186.0 lb

## 2015-05-11 DIAGNOSIS — W540XXA Bitten by dog, initial encounter: Secondary | ICD-10-CM

## 2015-05-11 DIAGNOSIS — N750 Cyst of Bartholin's gland: Secondary | ICD-10-CM | POA: Diagnosis not present

## 2015-05-11 MED ORDER — SILVER SULFADIAZINE 1 % EX CREA: TOPICAL_CREAM | CUTANEOUS | Status: DC

## 2015-05-11 NOTE — Progress Notes (Deleted)
    Patient ID: Veronica [name], female    DOB: Jul 07, 1980, 34 y.o.   MRN: [id number]  HPI    Review of Systems     Objective:   Physical Exam        Assessment & Plan:

## 2015-05-11 NOTE — Progress Notes (Signed)
Patient ID: Veronica [name], female   DOB: 1980-07-23, 34 y.o.   MRN: [id number]      Chief Complaint  Patient presents with  . Follow-up    Blood pressure 110/80, pulse 74, weight 186 lb (84.369 kg), last menstrual period 04/21/2015.  34 y.o. [id number] Patient's last menstrual period was 04/21/2015. The current method of family planning is none.  Subjective Pt here for follow up and to check dog bite  Had Bartholin's cyst abscess removed in office, sutures pulled through and bled, re sutured with good result  Objective Vulva:  Sutures from barthoin's present but well healed Vagina:  normal mucosa, no discharge Cervix:   Uterus:   Adnexa: ovaries:,   Right leg status post dog bite    Pertinent ROS No burning with urination, frequency or urgency No nausea, vomiting or diarrhea Nor fever chills or other constitutional symptoms   Labs or studies     Impression Diagnoses this Encounter::   ICD-9-CM ICD-10-CM   1. Bartholin's gland cyst 616.2 N75.0   2. Dog bite 879.8 T14.8    E906.0 W54.0XXA     Established relevant diagnosis(es):   Plan/Recommendations: Meds ordered this encounter  Medications  . silver sulfADIAZINE (SILVADENE) 1 % cream    Sig: Apply to area 3-4 times per day    Dispense:  50 g    Refill:  11    Labs or Scans Ordered: No orders of the defined types were placed in this encounter.      Follow up prn       Face to face time:  15 minutes  Greater than 50% of the visit time was spent in counseling and coordination of care with the patient.  The summary and outline of the counseling and care coordination is summarized in the note above.   All questions were answered.

## 2015-05-17 ENCOUNTER — Ambulatory Visit (INDEPENDENT_AMBULATORY_CARE_PROVIDER_SITE_OTHER): Payer: Medicaid Other | Admitting: Obstetrics & Gynecology

## 2015-05-17 ENCOUNTER — Encounter: Payer: Self-pay | Admitting: Obstetrics & Gynecology

## 2015-05-17 VITALS — BP 118/80 | HR 60 | Wt 185.0 lb

## 2015-05-17 DIAGNOSIS — N751 Abscess of Bartholin's gland: Secondary | ICD-10-CM

## 2015-05-17 MED ORDER — METRONIDAZOLE 500 MG PO TABS: 500.0000 mg | ORAL_TABLET | Freq: Two times a day (BID) | ORAL | Status: DC

## 2015-05-17 MED ORDER — CIPROFLOXACIN HCL 500 MG PO TABS: 500.0000 mg | ORAL_TABLET | Freq: Two times a day (BID) | ORAL | Status: DC

## 2015-05-17 NOTE — Progress Notes (Signed)
Patient ID: Veronica [name], female   DOB: 10/20/1980, 34 y.o.   MRN: [id number]      Chief Complaint  Patient presents with  . work-in- gyn visit    drainage from bartholin's cyst.    Blood pressure 118/80, pulse 60, weight 185 lb (83.915 kg), last menstrual period 04/21/2015.  34 y.o. [id number] Patient's last menstrual period was 04/21/2015. The current method of family planning is none.  Subjective Pt presents complaining of discharge from her Bartholin's  Objective 2 cm right vulvar cyst, yellowish discharge  Pertinent ROS   Labs or studies     Impression Diagnoses this Encounter::   ICD-9-CM ICD-10-CM   1. Bartholin's gland abscess 616.3 N75.1     Established relevant diagnosis(es):   Plan/Recommendations: Meds ordered this encounter  Medications  . ciprofloxacin (CIPRO) 500 MG tablet    Sig: Take 1 tablet (500 mg total) by mouth 2 (two) times daily.    Dispense:  20 tablet    Refill:  0  . metroNIDAZOLE (FLAGYL) 500 MG tablet    Sig: Take 1 tablet (500 mg total) by mouth 2 (two) times daily.    Dispense:  20 tablet    Refill:  0    Labs or Scans Ordered: No orders of the defined types were placed in this encounter.      Follow up  2 weeks    All questions were answered.

## 2015-05-31 ENCOUNTER — Ambulatory Visit: Payer: Medicaid Other | Admitting: Obstetrics & Gynecology

## 2015-11-07 ENCOUNTER — Emergency Department (HOSPITAL_COMMUNITY): Payer: Self-pay

## 2015-11-07 ENCOUNTER — Emergency Department (HOSPITAL_COMMUNITY)
Admission: EM | Admit: 2015-11-07 | Discharge: 2015-11-08 | Disposition: A | Discharge status: Home / Self Care | Payer: Self-pay | Attending: Emergency Medicine | Admitting: Emergency Medicine

## 2015-11-07 ENCOUNTER — Encounter (HOSPITAL_COMMUNITY): Payer: Self-pay | Admitting: Emergency Medicine

## 2015-11-07 DIAGNOSIS — J0130 Acute sphenoidal sinusitis, unspecified: Secondary | ICD-10-CM | POA: Insufficient documentation

## 2015-11-07 DIAGNOSIS — Z8619 Personal history of other infectious and parasitic diseases: Secondary | ICD-10-CM | POA: Insufficient documentation

## 2015-11-07 DIAGNOSIS — M6281 Muscle weakness (generalized): Secondary | ICD-10-CM | POA: Insufficient documentation

## 2015-11-07 DIAGNOSIS — Z792 Long term (current) use of antibiotics: Secondary | ICD-10-CM | POA: Insufficient documentation

## 2015-11-07 DIAGNOSIS — Z791 Long term (current) use of non-steroidal anti-inflammatories (NSAID): Secondary | ICD-10-CM | POA: Insufficient documentation

## 2015-11-07 DIAGNOSIS — G510 Bell's palsy: Secondary | ICD-10-CM | POA: Insufficient documentation

## 2015-11-07 DIAGNOSIS — Z8742 Personal history of other diseases of the female genital tract: Secondary | ICD-10-CM | POA: Insufficient documentation

## 2015-11-07 DIAGNOSIS — Z8659 Personal history of other mental and behavioral disorders: Secondary | ICD-10-CM | POA: Insufficient documentation

## 2015-11-07 DIAGNOSIS — G8929 Other chronic pain: Secondary | ICD-10-CM | POA: Insufficient documentation

## 2015-11-07 LAB — ETHANOL: Alcohol, Ethyl (B): <5 mg/dL (ref <5)

## 2015-11-07 LAB — DIFFERENTIAL
BASOS ABS: 0 10*3/uL (ref 0.0–0.1)
Basophils Relative: 0 %
Eosinophils Absolute: 0.1 10*3/uL (ref 0.0–0.7)
Eosinophils Relative: 1 %
LYMPHS ABS: 2.6 10*3/uL (ref 0.7–4.0)
LYMPHS PCT: 45 %
MONOS PCT: 9 %
Monocytes Absolute: 0.5 10*3/uL (ref 0.1–1.0)
NEUTROS ABS: 2.6 10*3/uL (ref 1.7–7.7)
Neutrophils Relative %: 45 %

## 2015-11-07 LAB — I-STAT TROPONIN, ED: TROPONIN I, POC: 0 ng/mL (ref 0.00–0.08)

## 2015-11-07 LAB — COMPREHENSIVE METABOLIC PANEL
ALBUMIN: 3.5 g/dL (ref 3.5–5.0)
ALK PHOS: 51 U/L (ref 38–126)
ALT: 13 U/L — AB (ref 14–54)
AST: 16 U/L (ref 15–41)
Anion gap: 7 (ref 5–15)
BILIRUBIN TOTAL: 0.5 mg/dL (ref 0.3–1.2)
BUN: 11 mg/dL (ref 6–20)
CO2: 24 mmol/L (ref 22–32)
CREATININE: 0.71 mg/dL (ref 0.44–1.00)
Calcium: 9.1 mg/dL (ref 8.9–10.3)
Chloride: 104 mmol/L (ref 101–111)
GFR calc Af Amer: >60 mL/min (ref >60)
GLUCOSE: 102 mg/dL — AB (ref 65–99)
Potassium: 3.8 mmol/L (ref 3.5–5.1)
Sodium: 135 mmol/L (ref 135–145)
TOTAL PROTEIN: 7.5 g/dL (ref 6.5–8.1)

## 2015-11-07 LAB — CBC
HEMATOCRIT: 36 % (ref 36.0–46.0)
HEMOGLOBIN: 12.3 g/dL (ref 12.0–15.0)
MCH: 29.4 pg (ref 26.0–34.0)
MCHC: 34.2 g/dL (ref 30.0–36.0)
MCV: 86.1 fL (ref 78.0–100.0)
Platelets: 296 10*3/uL (ref 150–400)
RBC: 4.18 MIL/uL (ref 3.87–5.11)
RDW: 13.2 % (ref 11.5–15.5)
WBC: 5.7 10*3/uL (ref 4.0–10.5)

## 2015-11-07 LAB — APTT: APTT: 33 s (ref 24–37)

## 2015-11-07 LAB — PROTIME-INR
INR: 1.28 (ref 0.00–1.49)
Prothrombin Time: 16.1 seconds — ABNORMAL HIGH (ref 11.6–15.2)

## 2015-11-07 NOTE — ED Provider Notes (Signed)
CSN: [id number]     Arrival date & time 11/07/15  2229 History   First MD Initiated Contact with Patient 11/07/15 2256     Chief Complaint  Patient presents with  . Facial Droop      The history is provided by the patient.  Patient presents with facial droop. States she woke up at around 6:00 after nap and couldn't move her face. States she was drooling and no trouble drinking. May have sent some slight weakness in her left arm also. No episodes like this before. She does not smoke. No headache. No confusion. No vision changes.  Past Medical History  Diagnosis Date  . Tonsillitis 06/05/2013  . Depression   . ADHD (attention deficit hyperactivity disorder)   . Chronic low back pain 07/22/2014  . Cervical spondylosis without myelopathy 07/22/2014  . Bartholin cyst 10/29/2014  . Menorrhagia 10/29/2014  . Dysmenorrhea 10/29/2014  . Vaginal discharge 10/29/2014  . BV (bacterial vaginosis) 10/29/2014  . Vaginal Pap smear, abnormal   . Fatigue 10/29/2014  . Shoulder pain     right  . History of chlamydia 11/17/2014   Past Surgical History  Procedure Laterality Date  . Lumbar laminectomy    . Tonsillectomy Bilateral 06/06/2013    Procedure: TONSILLECTOMY;  Surgeon: Melvenia Beam, MD;  Location: [facility] OR;  Service: ENT;  Laterality: Bilateral;  . Cryotherapy     Family History  Problem Relation Age of Onset  . Hypertension Mother   . Diabetes Mother   . Anxiety disorder Mother   . Hypertension Father   . Hypertension Sister   . Diabetes Maternal Grandmother   . Hypertension Maternal Grandmother   . Hypertension Maternal Grandfather   . Diabetes Paternal Grandmother   . Hypertension Paternal Grandmother   . Hypertension Paternal Grandfather    Social History  Substance Use Topics  . Smoking status: Never Smoker   . Smokeless tobacco: Never Used  . Alcohol Use: No   OB History    Gravida Para Term Preterm AB TAB SAB Ectopic Multiple Living   Review of Systems   Constitutional: Negative for activity change and appetite change.  Eyes: Negative for pain.  Respiratory: Negative for chest tightness and shortness of breath.   Cardiovascular: Negative for chest pain and leg swelling.  Gastrointestinal: Negative for nausea, vomiting, abdominal pain and diarrhea.  Genitourinary: Negative for flank pain.  Musculoskeletal: Negative for back pain and neck stiffness.  Skin: Negative for rash.  Neurological: Positive for weakness. Negative for numbness and headaches.  Psychiatric/Behavioral: Negative for behavioral problems.      Allergies  Review of patient's allergies indicates no known allergies.  Home Medications   Prior to Admission medications   Medication Sig Start Date End Date Taking? Authorizing Provider  ciprofloxacin (CIPRO) 500 MG tablet Take 1 tablet (500 mg total) by mouth 2 (two) times daily. 05/17/15   Lazaro Arms, MD  ibuprofen (ADVIL,MOTRIN) 800 MG tablet Take 1 tablet (800 mg total) by mouth 3 (three) times daily. 04/18/15   Tammy Triplett, PA-C  metroNIDAZOLE (FLAGYL) 500 MG tablet Take 1 tablet (500 mg total) by mouth 2 (two) times daily. 05/17/15   Lazaro Arms, MD  silver sulfADIAZINE (SILVADENE) 1 % cream Apply to area 3-4 times per day 05/11/15   Lazaro Arms, MD   BP 107/57 mmHg  Pulse 63  Temp(Src) 98.7 F (37.1 C) (Oral)  Resp 21  SpO2 96% Physical Exam  Constitutional: She appears well-developed.  HENT:  Head: Atraumatic.  Neck: Neck supple.  Cardiovascular: Normal rate.   Abdominal: Soft.  Neurological:  More pronounced lower face compared upper face with both involved. Extraocular movements intact. Pupils reactive. Good grip strength bilaterally. May have mildly decreased flexion and extension strength in upper arm. Good straight leg raise and plantar flexion dorsiflexion of bilateral feet.  Skin: Skin is warm.    ED Course  Procedures (including critical care time) Labs Review Labs Reviewed   PROTIME-INR - Abnormal; Notable for the following:    Prothrombin Time 16.1 (*)    All other components within normal limits  COMPREHENSIVE METABOLIC PANEL - Abnormal; Notable for the following:    Glucose, Bld 102 (*)    ALT 13 (*)    All other components within normal limits  ETHANOL  APTT  CBC  DIFFERENTIAL  I-STAT TROPOININ, ED    Imaging Review No results found. I have personally reviewed and evaluated these images and lab results as part of my medical decision-making.   EKG Interpretation   Date/Time:  Monday Nov 07 2015 22:36:10 EDT Ventricular Rate:  72 PR Interval:  146 QRS Duration: 88 QT Interval:  374 QTC Calculation: 409 R Axis:   82 Text Interpretation:  Normal sinus rhythm Possible Left atrial enlargement  Nonspecific T wave abnormality Abnormal ECG Confirmed by Rubin Payor  MD,  Harrold Donath [phone]) on 11/08/2015 12:20:17 AM      MDM   Final diagnoses:  None    Patient with left-sided facial droop.I think it is likely a Bell's palsy. Seen on a quick bedside consultation with Dr. Zigmund Daniel. Will get MRI to rule out stroke since she has continued symptoms but likely discharge home with treatment for Bell's.    Benjiman Core, MD 11/08/15 [place] Manners

## 2015-11-07 NOTE — ED Notes (Signed)
Pt. woke up this evening with left facial droop/numbness and fatigue , pt. stated she went to sleep around 6 pm this evening .

## 2015-11-07 NOTE — ED Notes (Signed)
EDP & neurology at [facility] assessing pt, pt to have MRI, "no further labs or IV needed". Pt updated with plan. Alert, NAD, calm, interactive.

## 2015-11-07 NOTE — ED Notes (Signed)
Per EDP, "do not go to CT, have neurology see first, no further labs to be drawn, no IV at this time".

## 2015-11-08 ENCOUNTER — Emergency Department (HOSPITAL_COMMUNITY): Payer: Self-pay

## 2015-11-08 MED ORDER — PREDNISONE 5 MG PO TABS: 25.0000 mg | ORAL_TABLET | Freq: Two times a day (BID) | ORAL | Status: AC

## 2015-11-08 MED ORDER — AMOXICILLIN-POT CLAVULANATE 875-125 MG PO TABS: 1.0000 | ORAL_TABLET | Freq: Two times a day (BID) | ORAL | Status: DC

## 2015-11-08 MED ORDER — PREDNISONE 10 MG PO TABS
25.0000 mg | ORAL_TABLET | Freq: Once | ORAL | Status: AC
  Administered 2015-11-08: 25 mg via ORAL
  Filled 2015-11-08: qty 2.5

## 2015-11-08 NOTE — ED Notes (Signed)
Called pharmacy to follow up on Prednisone, which is on its way

## 2015-11-08 NOTE — ED Notes (Signed)
Patient able to ambulate independently  

## 2015-11-08 NOTE — Discharge Instructions (Signed)
Bell Palsy Bell palsy is a condition in which the muscles on one side of the face become paralyzed. This often causes one side of the face to droop. It is a common condition and most people recover completely. RISK FACTORS Risk factors for Bell palsy include:  Pregnancy.  Diabetes.  An infection by a virus, such as infections that cause cold sores. CAUSES  Bell palsy is caused by damage to or inflammation of a nerve in your face. It is unclear why this happens, but an infection by a virus may lead to it. Most of the time the reason it happens is unknown. SIGNS AND SYMPTOMS  Symptoms can range from mild to severe and can take place over a number of hours. Symptoms may include:  Being unable to:  Raise one or both eyebrows.  Close one or both eyes.  Feel parts of your face (facial numbness).  Drooping of the eyelid and corner of the mouth.  Weakness in the face.  Paralysis of half your face.  Loss of taste.  Sensitivity to loud noises.  Difficulty chewing.  Tearing up of the affected eye.  Dryness in the affected eye.  Drooling.  Pain behind one ear. DIAGNOSIS  Diagnosis of Bell palsy may include:  A medical history and physical exam.  An MRI.  A CT scan.  Electromyography (EMG). This is a test that checks how your nerves are working. TREATMENT  Treatment may include antiviral medicine to help shorten the length of the condition. Sometimes treatment is not needed and the symptoms go away on their own. HOME CARE INSTRUCTIONS   Take medicines only as directed by your health care provider.  Do facial massages and exercises as directed by your health care provider.  If your eye is affected:  Use moisturizing eye drops to prevent drying of your eye as directed by your health care provider.  Protect your eye as directed by your health care provider. SEEK MEDICAL CARE IF:  Your symptoms do not get better or get worse.  You are drooling.  Your eye is red,  irritated, or hurts. SEEK IMMEDIATE MEDICAL CARE IF:   Another part of your body feels weak or numb.  You have difficulty swallowing.  You have a fever along with symptoms of Bell palsy.  You develop neck pain. MAKE SURE YOU:   Understand these instructions.  Will watch your condition.  Will get help right away if you are not doing well or get worse.   This information is not intended to replace advice given to you by your health care provider. Make sure you discuss any questions you have with your health care provider.   Document Released: 06/11/2005 Document Revised: 03/02/2015 Document Reviewed: 09/18/2013 Elsevier Interactive Patient Education 2016 [employer]  Sinusitis, Adult Sinusitis is redness, soreness, and inflammation of the paranasal sinuses. Paranasal sinuses are air pockets within the bones of your face. They are located beneath your eyes, in the middle of your forehead, and above your eyes. In healthy paranasal sinuses, mucus is able to drain out, and air is able to circulate through them by way of your nose. However, when your paranasal sinuses are inflamed, mucus and air can become trapped. This can allow bacteria and other germs to grow and cause infection. Sinusitis can develop quickly and last only a short time (acute) or continue over a long period (chronic). Sinusitis that lasts for more than 12 weeks is considered chronic. CAUSES Causes of sinusitis include:  Allergies.  Structural abnormalities, such as displacement of the cartilage that separates your nostrils (deviated septum), which can decrease the air flow through your nose and sinuses and affect sinus drainage.  Functional abnormalities, such as when the small hairs (cilia) that line your sinuses and help remove mucus do not work properly or are not present. SIGNS AND SYMPTOMS Symptoms of acute and chronic sinusitis are the same. The primary symptoms are pain and pressure around the affected  sinuses. Other symptoms include:  Upper toothache.  Earache.  Headache.  Bad breath.  Decreased sense of smell and taste.  A cough, which worsens when you are lying flat.  Fatigue.  Fever.  Thick drainage from your nose, which often is green and may contain pus (purulent).  Swelling and warmth over the affected sinuses. DIAGNOSIS Your health care provider will perform a physical exam. During your exam, your health care provider may perform any of the following to help determine if you have acute sinusitis or chronic sinusitis:  Look in your nose for signs of abnormal growths in your nostrils (nasal polyps).  Tap over the affected sinus to check for signs of infection.  View the inside of your sinuses using an imaging device that has a light attached (endoscope). If your health care provider suspects that you have chronic sinusitis, one or more of the following tests may be recommended:  Allergy tests.  Nasal culture. A sample of mucus is taken from your nose, sent to a lab, and screened for bacteria.  Nasal cytology. A sample of mucus is taken from your nose and examined by your health care provider to determine if your sinusitis is related to an allergy. TREATMENT Most cases of acute sinusitis are related to a viral infection and will resolve on their own within 10 days. Sometimes, medicines are prescribed to help relieve symptoms of both acute and chronic sinusitis. These may include pain medicines, decongestants, nasal steroid sprays, or saline sprays. However, for sinusitis related to a bacterial infection, your health care provider will prescribe antibiotic medicines. These are medicines that will help kill the bacteria causing the infection. Rarely, sinusitis is caused by a fungal infection. In these cases, your health care provider will prescribe antifungal medicine. For some cases of chronic sinusitis, surgery is needed. Generally, these are cases in which sinusitis  recurs more than 3 times per year, despite other treatments. HOME CARE INSTRUCTIONS  Drink plenty of water. Water helps thin the mucus so your sinuses can drain more easily.  Use a humidifier.  Inhale steam 3-4 times a day (for example, sit in the bathroom with the shower running).  Apply a warm, moist washcloth to your face 3-4 times a day, or as directed by your health care provider.  Use saline nasal sprays to help moisten and clean your sinuses.  Take medicines only as directed by your health care provider.  If you were prescribed either an antibiotic or antifungal medicine, finish it all even if you start to feel better. SEEK IMMEDIATE MEDICAL CARE IF:  You have increasing pain or severe headaches.  You have nausea, vomiting, or drowsiness.  You have swelling around your face.  You have vision problems.  You have a stiff neck.  You have difficulty breathing.   This information is not intended to replace advice given to you by your health care provider. Make sure you discuss any questions you have with your health care provider.   Document Released: 06/11/2005 Document Revised: 07/02/2014 Document Reviewed: 06/26/2011 Elsevier  Interactive Patient Education ©2016 Elsevier Inc. ° °

## 2015-11-15 ENCOUNTER — Emergency Department (HOSPITAL_COMMUNITY)
Admission: EM | Admit: 2015-11-15 | Discharge: 2015-11-15 | Disposition: A | Discharge status: Home / Self Care | Payer: No Typology Code available for payment source | Attending: Emergency Medicine | Admitting: Emergency Medicine

## 2015-11-15 ENCOUNTER — Encounter (HOSPITAL_COMMUNITY): Payer: Self-pay | Admitting: Adult Health

## 2015-11-15 DIAGNOSIS — Z8742 Personal history of other diseases of the female genital tract: Secondary | ICD-10-CM | POA: Insufficient documentation

## 2015-11-15 DIAGNOSIS — Z8709 Personal history of other diseases of the respiratory system: Secondary | ICD-10-CM | POA: Insufficient documentation

## 2015-11-15 DIAGNOSIS — G510 Bell's palsy: Secondary | ICD-10-CM | POA: Insufficient documentation

## 2015-11-15 DIAGNOSIS — F329 Major depressive disorder, single episode, unspecified: Secondary | ICD-10-CM | POA: Insufficient documentation

## 2015-11-15 DIAGNOSIS — Z8619 Personal history of other infectious and parasitic diseases: Secondary | ICD-10-CM | POA: Insufficient documentation

## 2015-11-15 DIAGNOSIS — G8929 Other chronic pain: Secondary | ICD-10-CM | POA: Insufficient documentation

## 2015-11-15 DIAGNOSIS — Z09 Encounter for follow-up examination after completed treatment for conditions other than malignant neoplasm: Secondary | ICD-10-CM | POA: Insufficient documentation

## 2015-11-15 DIAGNOSIS — Z8659 Personal history of other mental and behavioral disorders: Secondary | ICD-10-CM | POA: Insufficient documentation

## 2015-11-15 MED ORDER — NAPROXEN 500 MG PO TABS: 500.0000 mg | ORAL_TABLET | Freq: Two times a day (BID) | ORAL | Status: DC

## 2015-11-15 MED ORDER — GABAPENTIN 100 MG PO CAPS: 100.0000 mg | ORAL_CAPSULE | Freq: Three times a day (TID) | ORAL | Status: DC | PRN

## 2015-11-15 NOTE — ED Provider Notes (Signed)
CSN: [id number]     Arrival date & time 11/15/15  1757 History   By signing my name below, I, Arianna Nassar and Majel Homer, attest that this documentation has been prepared under the direction and in the presence of Shawn Joy, PA-C. Electronically Signed: Octavia Heir, ED Scribe. 11/15/2015. 7:28 PM.    Chief Complaint  Patient presents with  . Follow-up     The history is provided by the patient. No language interpreter was used.   HPI Comments: Veronica [name] is a 35 y.o. female who presents to the Emergency Department complaining of sudden onset, unchanged, moderate, left jaw pain due to Bell's Palsy that was diagnosed last week. Pt originally had left sided facial numbness, but once this subsided, she began to have pain instead. Patient has no progression of her symptoms, including facial weakness has since her Bell's palsy diagnosis on May 16. Pt reports she was supposed to follow up with a neurologist but was unable to due to lack of full insurance. She notes she is unsure how to manage her current pain after being newly diagnosed. Pt states she has not tried any medications to alleviate her symptoms. Pt denies trouble swallowing or breathing, or any other complaints.    Past Medical History  Diagnosis Date  . Tonsillitis 06/05/2013  . Depression   . ADHD (attention deficit hyperactivity disorder)   . Chronic low back pain 07/22/2014  . Cervical spondylosis without myelopathy 07/22/2014  . Bartholin cyst 10/29/2014  . Menorrhagia 10/29/2014  . Dysmenorrhea 10/29/2014  . Vaginal discharge 10/29/2014  . BV (bacterial vaginosis) 10/29/2014  . Vaginal Pap smear, abnormal   . Fatigue 10/29/2014  . Shoulder pain     right  . History of chlamydia 11/17/2014   Past Surgical History  Procedure Laterality Date  . Lumbar laminectomy    . Tonsillectomy Bilateral 06/06/2013    Procedure: TONSILLECTOMY;  Surgeon: Melvenia Beam, MD;  Location: [facility] OR;  Service: ENT;  Laterality: Bilateral;  .  Cryotherapy     Family History  Problem Relation Age of Onset  . Hypertension Mother   . Diabetes Mother   . Anxiety disorder Mother   . Hypertension Father   . Hypertension Sister   . Diabetes Maternal Grandmother   . Hypertension Maternal Grandmother   . Hypertension Maternal Grandfather   . Diabetes Paternal Grandmother   . Hypertension Paternal Grandmother   . Hypertension Paternal Grandfather    Social History  Substance Use Topics  . Smoking status: Never Smoker   . Smokeless tobacco: Never Used  . Alcohol Use: No   OB History    Gravida Para Term Preterm AB TAB SAB Ectopic Multiple Living   Review of Systems  Constitutional: Negative for fever and chills.  HENT: Negative for facial swelling and trouble swallowing.   Neurological: Positive for facial asymmetry. Negative for speech difficulty, weakness and numbness.  All other systems reviewed and are negative.     Allergies  Review of patient's allergies indicates no known allergies.  Home Medications   Prior to Admission medications   Medication Sig Start Date End Date Taking? Authorizing Provider  amoxicillin-clavulanate (AUGMENTIN) 875-125 MG tablet Take 1 tablet by mouth every 12 (twelve) hours. 11/08/15   Tilden Fossa, MD  gabapentin (NEURONTIN) 100 MG capsule Take 1 capsule (100 mg total) by mouth 3 (three) times daily as needed (For facial nerve pain.). 11/15/15  Shawn C Joy, PA-C  naproxen (NAPROSYN) 500 MG tablet Take 1 tablet (500 mg total) by mouth 2 (two) times daily. 11/15/15   Shawn C Joy, PA-C  predniSONE (DELTASONE) 5 MG tablet Take 5 tablets (25 mg total) by mouth 2 (two) times daily with a meal. 11/08/15 11/17/15  Tilden Fossa, MD   Triage Vitals: BP 140/82 mmHg  Pulse 80  Temp(Src) 98.3 F (36.8 C) (Oral)  Resp 18  Ht 5\' 6"  (1.676 m)  Wt 175 lb (79.379 kg)  BMI 28.26 kg/m2  SpO2 97%  LMP 11/12/2015 (Exact Date) Physical Exam  Constitutional: She is oriented to  person, place, and time. She appears well-developed and well-nourished. No distress.  HENT:  Head: Normocephalic and atraumatic.  Mouth/Throat: Oropharynx is clear and moist.  Eyes: Conjunctivae and EOM are normal. Pupils are equal, round, and reactive to light.  Neck: Normal range of motion. Neck supple.  Cardiovascular: Normal rate and regular rhythm.   Pulmonary/Chest: Effort normal. No respiratory distress.  Abdominal: Soft. There is no tenderness. There is no guarding.  Musculoskeletal: She exhibits no edema or tenderness.  Lymphadenopathy:    She has no cervical adenopathy.  Neurological: She is alert and oriented to person, place, and time. She has normal reflexes.  Left sided facial weakness and slight facial droop. Swallow reflexes intact. Readily handles oral secretions without difficulty. No sensory deficits. Strength 5/5 in all extremities. No gait disturbance. Coordination intact. Cranial nerves III-XII grossly intact, other than above.  Skin: Skin is warm and dry. She is not diaphoretic.  Psychiatric: She has a normal mood and affect. Her behavior is normal.  Nursing note and vitals reviewed.   ED Course  Procedures  DIAGNOSTIC STUDIES: Oxygen Saturation is 97% on RA, normal by my interpretation.  COORDINATION OF CARE: 7:19 PM Discussed treatment plan which includes naproxen and ibuprofen and neurontin if naproxen does not alleviate symptoms with pt at bedside and pt agreed to plan. Pt has been cleared to go back to work and has been advised to. Pt has been given information to follow-up with a neurologist.     MDM   Final diagnoses:  Follow-up exam  Bell's palsy    Veronica [name] presents for repeat evaluation following a diagnosis of Bell's palsy on May 16.  Suspect neurogenic pain, especially in light of the patient's recent Bell's palsy diagnosis. The patient's Bell's palsy is likely diminishing and as the numbness subsides, neurogenic pain emerges. Patient has  no red flag symptoms. Home care and return precautions discussed. Patient voiced understanding of these instructions and is comfortable with discharge.  I personally performed the services described in this documentation, which was scribed in my presence. The recorded information has been reviewed and is accurate.   Anselm Pancoast, PA-C 11/15/15 2047  Doug Sou, MD 11/15/15 [phone]

## 2015-11-15 NOTE — ED Notes (Signed)
Patient to ED for reassessment of her Bell's Palsy

## 2015-11-15 NOTE — Discharge Instructions (Signed)
You have been seen today for a follow-up evaluation of your Bell's palsy. Contact the neurologist listed as soon as possible to make an appointment. Use 500 mg of naproxen every 12 hours or 800 mg of ibuprofen every 8 hours for your nerve pain. If this does not work, you may try Neurontin. Do not drive or do other dangerous activities while taking the Neurontin. Follow up with PCP as needed. Return to ED should symptoms worsen.

## 2015-11-15 NOTE — ED Notes (Signed)
Pt was diagnosed with Belld palsy last week and told to come back this week for a follow up. She needs to know if its okay for her to work. Reports that her face is still numb. She was told to see a neurologist but can't afford it and was told to come back to the ER.

## 2016-04-08 ENCOUNTER — Emergency Department (HOSPITAL_COMMUNITY): Payer: Self-pay

## 2016-04-08 ENCOUNTER — Encounter (HOSPITAL_COMMUNITY): Payer: Self-pay

## 2016-04-08 ENCOUNTER — Emergency Department (HOSPITAL_COMMUNITY)
Admission: EM | Admit: 2016-04-08 | Discharge: 2016-04-08 | Disposition: A | Discharge status: Home / Self Care | Payer: Self-pay | Attending: Emergency Medicine | Admitting: Emergency Medicine

## 2016-04-08 DIAGNOSIS — Y92410 Unspecified street and highway as the place of occurrence of the external cause: Secondary | ICD-10-CM | POA: Insufficient documentation

## 2016-04-08 DIAGNOSIS — M79605 Pain in left leg: Secondary | ICD-10-CM

## 2016-04-08 DIAGNOSIS — R51 Headache: Secondary | ICD-10-CM | POA: Insufficient documentation

## 2016-04-08 DIAGNOSIS — S8992XA Unspecified injury of left lower leg, initial encounter: Secondary | ICD-10-CM | POA: Insufficient documentation

## 2016-04-08 DIAGNOSIS — F909 Attention-deficit hyperactivity disorder, unspecified type: Secondary | ICD-10-CM | POA: Insufficient documentation

## 2016-04-08 DIAGNOSIS — Y939 Activity, unspecified: Secondary | ICD-10-CM | POA: Insufficient documentation

## 2016-04-08 DIAGNOSIS — Y999 Unspecified external cause status: Secondary | ICD-10-CM | POA: Insufficient documentation

## 2016-04-08 LAB — BASIC METABOLIC PANEL
ANION GAP: 10 (ref 5–15)
BUN: 9 mg/dL (ref 6–20)
CALCIUM: 9.1 mg/dL (ref 8.9–10.3)
CO2: 22 mmol/L (ref 22–32)
CREATININE: 0.71 mg/dL (ref 0.44–1.00)
Chloride: 104 mmol/L (ref 101–111)
Glucose, Bld: 99 mg/dL (ref 65–99)
Potassium: 3.3 mmol/L — ABNORMAL LOW (ref 3.5–5.1)
SODIUM: 136 mmol/L (ref 135–145)

## 2016-04-08 LAB — CBC WITH DIFFERENTIAL/PLATELET
BASOS ABS: 0 10*3/uL (ref 0.0–0.1)
BASOS PCT: 0 %
EOS ABS: 0.1 10*3/uL (ref 0.0–0.7)
Eosinophils Relative: 1 %
HCT: 36.4 % (ref 36.0–46.0)
HEMOGLOBIN: 12.5 g/dL (ref 12.0–15.0)
Lymphocytes Relative: 50 %
Lymphs Abs: 3.1 10*3/uL (ref 0.7–4.0)
MCH: 29.9 pg (ref 26.0–34.0)
MCHC: 34.3 g/dL (ref 30.0–36.0)
MCV: 87.1 fL (ref 78.0–100.0)
MONOS PCT: 10 %
Monocytes Absolute: 0.6 10*3/uL (ref 0.1–1.0)
NEUTROS ABS: 2.4 10*3/uL (ref 1.7–7.7)
NEUTROS PCT: 39 %
Platelets: 242 10*3/uL (ref 150–400)
RBC: 4.18 MIL/uL (ref 3.87–5.11)
RDW: 12.6 % (ref 11.5–15.5)
WBC: 6.1 10*3/uL (ref 4.0–10.5)

## 2016-04-08 LAB — I-STAT BETA HCG BLOOD, ED (MC, WL, AP ONLY)

## 2016-04-08 MED ORDER — DIAZEPAM 5 MG/ML IJ SOLN: 5.0000 mg | Freq: Once | INTRAMUSCULAR | Status: DC

## 2016-04-08 MED ORDER — HYDROMORPHONE HCL 1 MG/ML IJ SOLN
1.0000 mg | Freq: Once | INTRAMUSCULAR | Status: AC
  Administered 2016-04-08: 1 mg via INTRAVENOUS
  Filled 2016-04-08: qty 1

## 2016-04-08 MED ORDER — LORAZEPAM 2 MG/ML IJ SOLN
1.0000 mg | Freq: Once | INTRAMUSCULAR | Status: DC
  Filled 2016-04-08: qty 1

## 2016-04-08 MED ORDER — POTASSIUM CHLORIDE CRYS ER 20 MEQ PO TBCR
20.0000 meq | EXTENDED_RELEASE_TABLET | Freq: Once | ORAL | Status: DC
  Filled 2016-04-08: qty 1

## 2016-04-08 MED ORDER — ONDANSETRON HCL 4 MG/2ML IJ SOLN
4.0000 mg | Freq: Once | INTRAMUSCULAR | Status: AC
  Administered 2016-04-08: 4 mg via INTRAVENOUS
  Filled 2016-04-08: qty 2

## 2016-04-08 MED ORDER — HYDROCODONE-ACETAMINOPHEN 5-325 MG PO TABS: 1.0000 | ORAL_TABLET | ORAL | 0 refills | Status: DC | PRN

## 2016-04-08 MED ORDER — MELOXICAM 7.5 MG PO TABS: 7.5000 mg | ORAL_TABLET | Freq: Every day | ORAL | 0 refills | Status: DC | PRN

## 2016-04-08 MED ORDER — DIAZEPAM 5 MG PO TABS: 5.0000 mg | ORAL_TABLET | Freq: Three times a day (TID) | ORAL | 0 refills | Status: DC | PRN

## 2016-04-08 MED ORDER — METHOCARBAMOL 1000 MG/10ML IJ SOLN
1000.0000 mg | Freq: Once | INTRAVENOUS | Status: AC
  Administered 2016-04-08: 1000 mg via INTRAVENOUS
  Filled 2016-04-08: qty 10
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
## 2016-04-08 NOTE — ED Notes (Signed)
PT feeling dizzy when walking in the hall way, only took half dozen steps before returning back to PT bed. PT became very hot at the same time.
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
## 2016-04-08 NOTE — ED Notes (Signed)
Pt transported to radiology.

## 2016-04-08 NOTE — Discharge Instructions (Signed)
Read the information below.  Use the prescribed medication as directed.  Please discuss all new medications with your pharmacist.  Do not take additional tylenol while taking the prescribed pain medication to avoid overdose.  You may return to the Emergency Department at any time for worsening condition or any new symptoms that concern you.     If you develop uncontrolled pain, weakness or numbness of the extremity, severe discoloration of the skin, or you are unable to move your leg or walk, return to the ER for a recheck.

## 2016-04-08 NOTE — ED Notes (Signed)
Pt c/o continued nausea.  [place] PA to be made aware.

## 2016-04-11 ENCOUNTER — Encounter (HOSPITAL_COMMUNITY): Payer: Self-pay

## 2016-04-11 ENCOUNTER — Emergency Department (HOSPITAL_COMMUNITY)
Admission: EM | Admit: 2016-04-11 | Discharge: 2016-04-11 | Disposition: A | Discharge status: Home / Self Care | Payer: No Typology Code available for payment source | Attending: Emergency Medicine | Admitting: Emergency Medicine

## 2016-04-11 DIAGNOSIS — Y92410 Unspecified street and highway as the place of occurrence of the external cause: Secondary | ICD-10-CM | POA: Insufficient documentation

## 2016-04-11 DIAGNOSIS — Y999 Unspecified external cause status: Secondary | ICD-10-CM | POA: Diagnosis not present

## 2016-04-11 DIAGNOSIS — R52 Pain, unspecified: Secondary | ICD-10-CM | POA: Diagnosis present

## 2016-04-11 DIAGNOSIS — F909 Attention-deficit hyperactivity disorder, unspecified type: Secondary | ICD-10-CM | POA: Diagnosis not present

## 2016-04-11 DIAGNOSIS — Y9389 Activity, other specified: Secondary | ICD-10-CM | POA: Insufficient documentation

## 2016-04-11 MED ORDER — METHOCARBAMOL 500 MG PO TABS: 500.0000 mg | ORAL_TABLET | Freq: Two times a day (BID) | ORAL | 0 refills | Status: DC

## 2016-04-11 MED ORDER — METHOCARBAMOL 1000 MG/10ML IJ SOLN
1000.0000 mg | Freq: Once | INTRAMUSCULAR | Status: AC
  Administered 2016-04-11: 1000 mg via INTRAMUSCULAR
  Filled 2016-04-11: qty 10

## 2016-04-11 MED ORDER — NAPROXEN 500 MG PO TABS: 500.0000 mg | ORAL_TABLET | Freq: Two times a day (BID) | ORAL | 0 refills | Status: DC | PRN
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
## 2016-05-15 DIAGNOSIS — F909 Attention-deficit hyperactivity disorder, unspecified type: Secondary | ICD-10-CM | POA: Diagnosis not present

## 2016-05-15 DIAGNOSIS — S99922D Unspecified injury of left foot, subsequent encounter: Secondary | ICD-10-CM | POA: Diagnosis not present

## 2016-05-16 ENCOUNTER — Emergency Department (HOSPITAL_COMMUNITY): Payer: Medicaid Other

## 2016-05-16 ENCOUNTER — Encounter (HOSPITAL_COMMUNITY): Payer: Self-pay

## 2016-05-16 ENCOUNTER — Emergency Department (HOSPITAL_COMMUNITY)
Admission: EM | Admit: 2016-05-16 | Discharge: 2016-05-16 | Disposition: A | Discharge status: Home / Self Care | Payer: Medicaid Other | Attending: Emergency Medicine | Admitting: Emergency Medicine

## 2016-05-16 ENCOUNTER — Ambulatory Visit (HOSPITAL_COMMUNITY): Admission: RE | Admit: 2016-05-16 | Payer: Medicaid Other | Source: Ambulatory Visit

## 2016-05-16 DIAGNOSIS — M79605 Pain in left leg: Secondary | ICD-10-CM

## 2016-05-16 LAB — BASIC METABOLIC PANEL
ANION GAP: 7 (ref 5–15)
BUN: 8 mg/dL (ref 6–20)
CHLORIDE: 104 mmol/L (ref 101–111)
CO2: 27 mmol/L (ref 22–32)
Calcium: 8.8 mg/dL — ABNORMAL LOW (ref 8.9–10.3)
Creatinine, Ser: 0.59 mg/dL (ref 0.44–1.00)
GFR calc non Af Amer: >60 mL/min (ref >60)
Glucose, Bld: 102 mg/dL — ABNORMAL HIGH (ref 65–99)
Potassium: 3.5 mmol/L (ref 3.5–5.1)
Sodium: 138 mmol/L (ref 135–145)

## 2016-05-16 LAB — POC URINE PREG, ED: PREG TEST UR: NEGATIVE

## 2016-05-16 LAB — D-DIMER, QUANTITATIVE: D-Dimer, Quant: <0.27 ug/mL-FEU (ref 0.00–0.50)

## 2016-05-16 MED ORDER — ENOXAPARIN SODIUM 80 MG/0.8ML ~~LOC~~ SOLN
1.0000 mg/kg | Freq: Once | SUBCUTANEOUS | Status: AC
  Administered 2016-05-16: 80 mg via SUBCUTANEOUS
  Filled 2016-05-16: qty 0.8

## 2016-05-16 NOTE — ED Notes (Signed)
Pt asking again about the wait ime thinking about leaving
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
## 2016-05-16 NOTE — Discharge Instructions (Signed)
Your Xrays are negative. Return tomorrow for an ultrasound of your leg. Return to the ED if you develop chest pain, shortness of breath, or any other concerns.
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
## 2016-05-16 NOTE — ED Notes (Signed)
Asking wait time

## 2016-05-18 ENCOUNTER — Ambulatory Visit (HOSPITAL_BASED_OUTPATIENT_CLINIC_OR_DEPARTMENT_OTHER)
Admission: RE | Admit: 2016-05-18 | Discharge: 2016-05-18 | Disposition: A | Discharge status: Home / Self Care | Payer: Medicaid Other | Source: Ambulatory Visit | Attending: Emergency Medicine | Admitting: Emergency Medicine

## 2016-05-18 ENCOUNTER — Ambulatory Visit (HOSPITAL_COMMUNITY)
Admission: RE | Admit: 2016-05-18 | Discharge: 2016-05-18 | Disposition: A | Discharge status: Home / Self Care | Payer: Medicaid Other | Source: Ambulatory Visit | Attending: Emergency Medicine | Admitting: Emergency Medicine

## 2016-05-18 DIAGNOSIS — M7989 Other specified soft tissue disorders: Secondary | ICD-10-CM | POA: Diagnosis present

## 2016-05-18 DIAGNOSIS — M79605 Pain in left leg: Secondary | ICD-10-CM | POA: Diagnosis not present

## 2016-05-18 DIAGNOSIS — M79609 Pain in unspecified limb: Secondary | ICD-10-CM | POA: Diagnosis not present

## 2016-05-18 NOTE — Progress Notes (Signed)
VASCULAR LAB PRELIMINARY  PRELIMINARY  PRELIMINARY  PRELIMINARY  Left lower extremity venous duplex completed.    Preliminary report:  Left:  No evidence of DVT, superficial thrombosis, or Baker's cyst.  Piero Mustard, RVS 05/18/2016, 11:48 AM

## 2016-07-06 ENCOUNTER — Telehealth: Payer: Self-pay | Admitting: Nurse Practitioner
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
## 2016-08-20 ENCOUNTER — Ambulatory Visit: Payer: Medicaid Other | Admitting: Obstetrics & Gynecology

## 2016-08-27 ENCOUNTER — Ambulatory Visit: Payer: Medicaid Other | Admitting: Obstetrics & Gynecology

## 2016-08-31 ENCOUNTER — Ambulatory Visit: Payer: Medicaid Other | Admitting: Obstetrics & Gynecology

## 2016-09-02 ENCOUNTER — Encounter (HOSPITAL_COMMUNITY): Payer: Self-pay

## 2016-09-02 ENCOUNTER — Emergency Department (HOSPITAL_BASED_OUTPATIENT_CLINIC_OR_DEPARTMENT_OTHER): Admit: 2016-09-02 | Discharge: 2016-09-02 | Disposition: A | Discharge status: Home / Self Care | Payer: Medicaid Other

## 2016-09-02 ENCOUNTER — Emergency Department (HOSPITAL_COMMUNITY)
Admission: EM | Admit: 2016-09-02 | Discharge: 2016-09-02 | Disposition: A | Discharge status: Home / Self Care | Payer: Medicaid Other | Attending: Emergency Medicine | Admitting: Emergency Medicine

## 2016-09-02 DIAGNOSIS — R52 Pain, unspecified: Secondary | ICD-10-CM | POA: Diagnosis present

## 2016-09-02 DIAGNOSIS — F909 Attention-deficit hyperactivity disorder, unspecified type: Secondary | ICD-10-CM | POA: Insufficient documentation

## 2016-09-02 DIAGNOSIS — M79662 Pain in left lower leg: Secondary | ICD-10-CM | POA: Insufficient documentation

## 2016-09-02 DIAGNOSIS — M2550 Pain in unspecified joint: Secondary | ICD-10-CM

## 2016-09-02 DIAGNOSIS — M25532 Pain in left wrist: Secondary | ICD-10-CM | POA: Diagnosis not present

## 2016-09-02 DIAGNOSIS — M79609 Pain in unspecified limb: Secondary | ICD-10-CM

## 2016-09-02 MED ORDER — PREDNISONE 20 MG PO TABS
60.0000 mg | ORAL_TABLET | Freq: Once | ORAL | Status: AC
  Administered 2016-09-02: 60 mg via ORAL
  Filled 2016-09-02: qty 3

## 2016-09-02 MED ORDER — PREDNISONE 20 MG PO TABS: 40.0000 mg | ORAL_TABLET | Freq: Every day | ORAL | 0 refills | Status: DC
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
## 2016-09-10 ENCOUNTER — Ambulatory Visit: Payer: Medicaid Other | Admitting: Obstetrics & Gynecology

## 2016-09-10 ENCOUNTER — Telehealth: Payer: Self-pay | Admitting: *Deleted

## 2016-09-11 NOTE — Telephone Encounter (Signed)
I do not but there some with [facility]

## 2016-09-13 NOTE — Telephone Encounter (Signed)
Sure pleaase refer her but I just don't specifically know one

## 2016-09-18 ENCOUNTER — Ambulatory Visit (INDEPENDENT_AMBULATORY_CARE_PROVIDER_SITE_OTHER): Payer: Medicaid Other | Admitting: Obstetrics & Gynecology

## 2016-09-18 ENCOUNTER — Encounter: Payer: Self-pay | Admitting: Obstetrics & Gynecology

## 2016-09-18 VITALS — BP 126/80 | HR 59 | Wt 185.0 lb

## 2016-09-18 DIAGNOSIS — A64 Unspecified sexually transmitted disease: Secondary | ICD-10-CM

## 2016-09-18 DIAGNOSIS — Z7251 High risk heterosexual behavior: Secondary | ICD-10-CM

## 2016-09-18 DIAGNOSIS — Z1159 Encounter for screening for other viral diseases: Secondary | ICD-10-CM

## 2016-09-18 DIAGNOSIS — Z118 Encounter for screening for other infectious and parasitic diseases: Secondary | ICD-10-CM | POA: Diagnosis not present
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
## 2016-09-20 LAB — GC/CHLAMYDIA PROBE AMP
Chlamydia trachomatis, NAA: NEGATIVE
NEISSERIA GONORRHOEAE BY PCR: NEGATIVE

## 2016-10-15 ENCOUNTER — Telehealth: Payer: Self-pay | Admitting: Obstetrics & Gynecology

## 2016-10-18 ENCOUNTER — Telehealth: Payer: Self-pay | Admitting: *Deleted

## 2016-10-18 NOTE — Telephone Encounter (Signed)
Informed patient that referral was sent per request to [facility].

## 2016-10-23 ENCOUNTER — Telehealth: Payer: Self-pay | Admitting: *Deleted

## 2016-10-23 NOTE — Telephone Encounter (Signed)
Informed patient that [facility] does not accept Medicaid. Pt stated she would look for another one.

## 2016-10-26 ENCOUNTER — Emergency Department (HOSPITAL_COMMUNITY): Payer: Medicaid Other

## 2016-10-26 ENCOUNTER — Emergency Department (HOSPITAL_COMMUNITY)
Admission: EM | Admit: 2016-10-26 | Discharge: 2016-10-26 | Disposition: A | Discharge status: Home / Self Care | Payer: Medicaid Other | Attending: Emergency Medicine | Admitting: Emergency Medicine

## 2016-10-26 ENCOUNTER — Encounter (HOSPITAL_COMMUNITY): Payer: Self-pay | Admitting: *Deleted

## 2016-10-26 DIAGNOSIS — S92525A Nondisplaced fracture of medial phalanx of left lesser toe(s), initial encounter for closed fracture: Secondary | ICD-10-CM | POA: Insufficient documentation

## 2016-10-26 DIAGNOSIS — Y929 Unspecified place or not applicable: Secondary | ICD-10-CM | POA: Diagnosis not present

## 2016-10-26 DIAGNOSIS — W228XXA Striking against or struck by other objects, initial encounter: Secondary | ICD-10-CM | POA: Diagnosis not present

## 2016-10-26 DIAGNOSIS — Y999 Unspecified external cause status: Secondary | ICD-10-CM | POA: Diagnosis not present

## 2016-10-26 DIAGNOSIS — F909 Attention-deficit hyperactivity disorder, unspecified type: Secondary | ICD-10-CM | POA: Diagnosis not present

## 2016-10-26 DIAGNOSIS — Y939 Activity, unspecified: Secondary | ICD-10-CM | POA: Diagnosis not present

## 2016-10-26 DIAGNOSIS — S99922A Unspecified injury of left foot, initial encounter: Secondary | ICD-10-CM | POA: Diagnosis present

## 2016-10-26 MED ORDER — IBUPROFEN 600 MG PO TABS: 600.0000 mg | ORAL_TABLET | Freq: Four times a day (QID) | ORAL | 0 refills | Status: DC | PRN

## 2016-10-26 MED ORDER — HYDROCODONE-ACETAMINOPHEN 5-325 MG PO TABS
1.0000 | ORAL_TABLET | Freq: Once | ORAL | Status: AC
  Administered 2016-10-26: 1 via ORAL
  Filled 2016-10-26: qty 1

## 2016-10-26 MED ORDER — HYDROCODONE-ACETAMINOPHEN 5-325 MG PO TABS: 1.0000 | ORAL_TABLET | Freq: Four times a day (QID) | ORAL | 0 refills | Status: DC | PRN
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
## 2016-10-26 NOTE — ED Notes (Signed)
Pt made aware to return if symptoms worsen or if any life threatening symptoms occur.   

## 2016-10-26 NOTE — ED Triage Notes (Signed)
Pt states she hit her right foot on a vacuum cleaner last night. Pt has increased pain upon moving foot.

## 2016-10-26 NOTE — Discharge Instructions (Signed)
See the orthopedic doctor in 1 week. Keep the foot elevated and apply ice.

## 2016-10-31 ENCOUNTER — Encounter: Payer: Self-pay | Admitting: Orthopaedic Surgery

## 2016-10-31 ENCOUNTER — Ambulatory Visit (INDEPENDENT_AMBULATORY_CARE_PROVIDER_SITE_OTHER): Payer: Medicaid Other | Admitting: Orthopaedic Surgery

## 2016-10-31 VITALS — BP 121/64 | HR 86 | Temp 97.5°F | Resp 18 | Ht 67.0 in | Wt 173.0 lb

## 2016-10-31 DIAGNOSIS — S92501A Displaced unspecified fracture of right lesser toe(s), initial encounter for closed fracture: Secondary | ICD-10-CM | POA: Diagnosis not present
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
## 2016-11-08 ENCOUNTER — Emergency Department (HOSPITAL_COMMUNITY)
Admission: EM | Admit: 2016-11-08 | Discharge: 2016-11-08 | Disposition: A | Discharge status: Home / Self Care | Payer: Medicaid Other | Attending: Emergency Medicine | Admitting: Emergency Medicine

## 2016-11-08 ENCOUNTER — Encounter (HOSPITAL_COMMUNITY): Payer: Self-pay | Admitting: Emergency Medicine

## 2016-11-08 DIAGNOSIS — G8929 Other chronic pain: Secondary | ICD-10-CM | POA: Diagnosis not present

## 2016-11-08 DIAGNOSIS — M7918 Myalgia, other site: Secondary | ICD-10-CM

## 2016-11-08 DIAGNOSIS — M542 Cervicalgia: Secondary | ICD-10-CM | POA: Diagnosis present

## 2016-11-08 MED ORDER — KETOROLAC TROMETHAMINE 60 MG/2ML IM SOLN
60.0000 mg | Freq: Once | INTRAMUSCULAR | Status: AC
  Administered 2016-11-08: 60 mg via INTRAMUSCULAR
  Filled 2016-11-08: qty 2

## 2016-11-08 MED ORDER — METHYLPREDNISOLONE SODIUM SUCC 125 MG IJ SOLR
125.0000 mg | Freq: Once | INTRAMUSCULAR | Status: AC
  Administered 2016-11-08: 125 mg via INTRAMUSCULAR
  Filled 2016-11-08: qty 2
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
## 2016-11-08 NOTE — Discharge Instructions (Signed)
Discuss pain management with your Physician

## 2016-11-14 ENCOUNTER — Ambulatory Visit (INDEPENDENT_AMBULATORY_CARE_PROVIDER_SITE_OTHER): Payer: Medicaid Other | Admitting: Orthopaedic Surgery

## 2016-11-14 ENCOUNTER — Ambulatory Visit (INDEPENDENT_AMBULATORY_CARE_PROVIDER_SITE_OTHER): Payer: Medicaid Other

## 2016-11-14 ENCOUNTER — Encounter: Payer: Self-pay | Admitting: Orthopaedic Surgery

## 2016-11-14 ENCOUNTER — Ambulatory Visit: Payer: Medicaid Other

## 2016-11-14 VITALS — BP 124/79 | HR 73 | Temp 97.9°F | Ht 67.0 in | Wt 179.0 lb

## 2016-11-14 DIAGNOSIS — S92501D Displaced unspecified fracture of right lesser toe(s), subsequent encounter for fracture with routine healing: Secondary | ICD-10-CM | POA: Diagnosis not present
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
## 2016-12-12 ENCOUNTER — Ambulatory Visit (INDEPENDENT_AMBULATORY_CARE_PROVIDER_SITE_OTHER): Payer: Medicaid Other

## 2016-12-12 ENCOUNTER — Ambulatory Visit: Payer: Medicaid Other

## 2016-12-12 ENCOUNTER — Ambulatory Visit (INDEPENDENT_AMBULATORY_CARE_PROVIDER_SITE_OTHER): Payer: Medicaid Other | Admitting: Orthopaedic Surgery

## 2016-12-12 ENCOUNTER — Encounter: Payer: Self-pay | Admitting: Orthopaedic Surgery

## 2016-12-12 VITALS — BP 122/71 | HR 78 | Ht 67.0 in | Wt 178.0 lb

## 2016-12-12 DIAGNOSIS — S92501D Displaced unspecified fracture of right lesser toe(s), subsequent encounter for fracture with routine healing: Secondary | ICD-10-CM

## 2016-12-12 MED ORDER — IBUPROFEN 800 MG PO TABS: 800.0000 mg | ORAL_TABLET | Freq: Three times a day (TID) | ORAL | 0 refills | Status: DC | PRN
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
## 2017-01-09 ENCOUNTER — Encounter: Payer: Self-pay | Admitting: Orthopaedic Surgery

## 2017-01-09 ENCOUNTER — Other Ambulatory Visit: Payer: Medicaid Other

## 2017-01-09 ENCOUNTER — Ambulatory Visit (INDEPENDENT_AMBULATORY_CARE_PROVIDER_SITE_OTHER): Payer: Medicaid Other

## 2017-01-09 ENCOUNTER — Ambulatory Visit (INDEPENDENT_AMBULATORY_CARE_PROVIDER_SITE_OTHER): Payer: Medicaid Other | Admitting: Orthopaedic Surgery

## 2017-01-09 VITALS — BP 135/55 | HR 78 | Temp 97.7°F | Ht 67.0 in | Wt 182.0 lb

## 2017-01-09 DIAGNOSIS — M25571 Pain in right ankle and joints of right foot: Secondary | ICD-10-CM | POA: Diagnosis not present

## 2017-01-09 DIAGNOSIS — S92501A Displaced unspecified fracture of right lesser toe(s), initial encounter for closed fracture: Secondary | ICD-10-CM

## 2017-01-09 DIAGNOSIS — S92501D Displaced unspecified fracture of right lesser toe(s), subsequent encounter for fracture with routine healing: Secondary | ICD-10-CM | POA: Diagnosis not present
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
## 2017-01-30 ENCOUNTER — Ambulatory Visit (INDEPENDENT_AMBULATORY_CARE_PROVIDER_SITE_OTHER): Payer: Medicaid Other

## 2017-01-30 ENCOUNTER — Ambulatory Visit (INDEPENDENT_AMBULATORY_CARE_PROVIDER_SITE_OTHER): Payer: Medicaid Other | Admitting: Orthopaedic Surgery

## 2017-01-30 ENCOUNTER — Encounter: Payer: Self-pay | Admitting: Orthopaedic Surgery

## 2017-01-30 VITALS — BP 109/66 | HR 71 | Temp 97.5°F | Ht 67.0 in | Wt 182.0 lb

## 2017-01-30 DIAGNOSIS — S92501D Displaced unspecified fracture of right lesser toe(s), subsequent encounter for fracture with routine healing: Secondary | ICD-10-CM
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
## 2017-02-20 ENCOUNTER — Ambulatory Visit (INDEPENDENT_AMBULATORY_CARE_PROVIDER_SITE_OTHER): Payer: Medicaid Other | Admitting: Orthopaedic Surgery

## 2017-02-20 VITALS — BP 112/68 | HR 89 | Temp 97.5°F | Ht 67.0 in | Wt 183.0 lb

## 2017-02-20 DIAGNOSIS — S92501D Displaced unspecified fracture of right lesser toe(s), subsequent encounter for fracture with routine healing: Secondary | ICD-10-CM
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
## 2017-03-12 ENCOUNTER — Ambulatory Visit: Payer: Medicaid Other | Admitting: Orthopaedic Surgery

## 2017-04-03 ENCOUNTER — Emergency Department (HOSPITAL_COMMUNITY)
Admission: EM | Admit: 2017-04-03 | Discharge: 2017-04-03 | Disposition: A | Discharge status: Home / Self Care | Payer: Medicaid Other | Attending: Emergency Medicine | Admitting: Emergency Medicine

## 2017-04-03 ENCOUNTER — Encounter (HOSPITAL_COMMUNITY): Payer: Self-pay

## 2017-04-03 DIAGNOSIS — Y999 Unspecified external cause status: Secondary | ICD-10-CM | POA: Diagnosis not present

## 2017-04-03 DIAGNOSIS — W268XXA Contact with other sharp object(s), not elsewhere classified, initial encounter: Secondary | ICD-10-CM | POA: Diagnosis not present

## 2017-04-03 DIAGNOSIS — S61412A Laceration without foreign body of left hand, initial encounter: Secondary | ICD-10-CM

## 2017-04-03 DIAGNOSIS — M545 Low back pain: Secondary | ICD-10-CM | POA: Diagnosis not present

## 2017-04-03 DIAGNOSIS — Y9389 Activity, other specified: Secondary | ICD-10-CM | POA: Diagnosis not present

## 2017-04-03 DIAGNOSIS — S6992XA Unspecified injury of left wrist, hand and finger(s), initial encounter: Secondary | ICD-10-CM | POA: Diagnosis present

## 2017-04-03 DIAGNOSIS — M79604 Pain in right leg: Secondary | ICD-10-CM | POA: Diagnosis not present

## 2017-04-03 DIAGNOSIS — Y929 Unspecified place or not applicable: Secondary | ICD-10-CM | POA: Insufficient documentation

## 2017-04-03 DIAGNOSIS — M7918 Myalgia, other site: Secondary | ICD-10-CM

## 2017-04-03 DIAGNOSIS — Z79899 Other long term (current) drug therapy: Secondary | ICD-10-CM | POA: Insufficient documentation

## 2017-04-03 DIAGNOSIS — M79605 Pain in left leg: Secondary | ICD-10-CM | POA: Insufficient documentation

## 2017-04-03 MED ORDER — CEPHALEXIN 500 MG PO CAPS: 500.0000 mg | ORAL_CAPSULE | Freq: Four times a day (QID) | ORAL | 0 refills | Status: DC

## 2017-04-03 MED ORDER — IBUPROFEN 600 MG PO TABS: 600.0000 mg | ORAL_TABLET | Freq: Four times a day (QID) | ORAL | 0 refills | Status: DC

## 2017-04-03 MED ORDER — ONDANSETRON HCL 4 MG PO TABS
4.0000 mg | ORAL_TABLET | Freq: Once | ORAL | Status: AC
  Administered 2017-04-03: 4 mg via ORAL
  Filled 2017-04-03: qty 1

## 2017-04-03 MED ORDER — CEPHALEXIN 500 MG PO CAPS
500.0000 mg | ORAL_CAPSULE | Freq: Once | ORAL | Status: AC
  Administered 2017-04-03: 500 mg via ORAL
  Filled 2017-04-03: qty 1

## 2017-04-03 MED ORDER — TRAMADOL HCL 50 MG PO TABS
100.0000 mg | ORAL_TABLET | Freq: Once | ORAL | Status: AC
  Administered 2017-04-03: 100 mg via ORAL
  Filled 2017-04-03: qty 2

## 2017-04-03 MED ORDER — TRAMADOL HCL 50 MG PO TABS: 50.0000 mg | ORAL_TABLET | Freq: Four times a day (QID) | ORAL | 0 refills | Status: DC | PRN

## 2017-04-03 MED ORDER — IBUPROFEN 800 MG PO TABS
800.0000 mg | ORAL_TABLET | Freq: Once | ORAL | Status: AC
  Administered 2017-04-03: 800 mg via ORAL
  Filled 2017-04-03: qty 1

## 2017-04-03 MED ORDER — BACITRACIN-NEOMYCIN-POLYMYXIN 400-5-5000 EX OINT
TOPICAL_OINTMENT | Freq: Once | CUTANEOUS | Status: AC
  Administered 2017-04-03: 1 via TOPICAL
  Filled 2017-04-03: qty 1
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
## 2017-04-03 NOTE — Discharge Instructions (Signed)
Please cleanse the wound to your left hand with soap and water daily. Please apply dressing to your hand and to the wound has healed using Neosporin. Please use Keflexwith breakfast, lunch, dinner, and at bedtime until all taken.  Please see the neurology specialist as previously arranged. You may want to also consider talking with Mrs. House possible rheumatology examination for possible fibromyalgia. Please use ibuprofen with breakfast, lunch, dinner, and at bedtime. Use Ultram every 6 hours for more severe pain.This medication may cause drowsiness. Please do not drink, drive, or participate in activity that requires concentration while taking this medication.
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
## 2017-04-19 ENCOUNTER — Emergency Department (HOSPITAL_COMMUNITY): Payer: Medicaid Other

## 2017-04-19 ENCOUNTER — Emergency Department (HOSPITAL_COMMUNITY)
Admission: EM | Admit: 2017-04-19 | Discharge: 2017-04-19 | Disposition: A | Discharge status: Home / Self Care | Payer: Medicaid Other | Attending: Emergency Medicine | Admitting: Emergency Medicine

## 2017-04-19 ENCOUNTER — Encounter (HOSPITAL_COMMUNITY): Payer: Self-pay

## 2017-04-19 DIAGNOSIS — R0789 Other chest pain: Secondary | ICD-10-CM | POA: Diagnosis not present

## 2017-04-19 DIAGNOSIS — Z79899 Other long term (current) drug therapy: Secondary | ICD-10-CM | POA: Insufficient documentation

## 2017-04-19 DIAGNOSIS — R079 Chest pain, unspecified: Secondary | ICD-10-CM | POA: Diagnosis present

## 2017-04-19 DIAGNOSIS — R110 Nausea: Secondary | ICD-10-CM | POA: Insufficient documentation

## 2017-04-19 DIAGNOSIS — R200 Anesthesia of skin: Secondary | ICD-10-CM | POA: Diagnosis not present

## 2017-04-19 DIAGNOSIS — Z8249 Family history of ischemic heart disease and other diseases of the circulatory system: Secondary | ICD-10-CM | POA: Diagnosis not present

## 2017-04-19 LAB — COMPREHENSIVE METABOLIC PANEL
ALBUMIN: 3.6 g/dL (ref 3.5–5.0)
ALK PHOS: 61 U/L (ref 38–126)
ALT: 15 U/L (ref 14–54)
AST: 18 U/L (ref 15–41)
Anion gap: 8 (ref 5–15)
BILIRUBIN TOTAL: 0.1 mg/dL — AB (ref 0.3–1.2)
BUN: 15 mg/dL (ref 6–20)
CALCIUM: 8.8 mg/dL — AB (ref 8.9–10.3)
CO2: 25 mmol/L (ref 22–32)
CREATININE: 0.64 mg/dL (ref 0.44–1.00)
Chloride: 104 mmol/L (ref 101–111)
GFR calc Af Amer: >60 mL/min (ref >60)
GFR calc non Af Amer: >60 mL/min (ref >60)
GLUCOSE: 100 mg/dL — AB (ref 65–99)
Potassium: 3.7 mmol/L (ref 3.5–5.1)
Sodium: 137 mmol/L (ref 135–145)
TOTAL PROTEIN: 7.1 g/dL (ref 6.5–8.1)

## 2017-04-19 LAB — D-DIMER, QUANTITATIVE (NOT AT ARMC)

## 2017-04-19 LAB — CBC WITH DIFFERENTIAL/PLATELET
BASOS PCT: 0 %
Basophils Absolute: 0 10*3/uL (ref 0.0–0.1)
EOS PCT: 1 %
Eosinophils Absolute: 0.1 10*3/uL (ref 0.0–0.7)
HCT: 30.6 % — ABNORMAL LOW (ref 36.0–46.0)
Hemoglobin: 10.2 g/dL — ABNORMAL LOW (ref 12.0–15.0)
Lymphocytes Relative: 49 %
Lymphs Abs: 2.9 10*3/uL (ref 0.7–4.0)
MCH: 28.6 pg (ref 26.0–34.0)
MCHC: 33.3 g/dL (ref 30.0–36.0)
MCV: 85.7 fL (ref 78.0–100.0)
MONO ABS: 0.4 10*3/uL (ref 0.1–1.0)
Monocytes Relative: 7 %
Neutro Abs: 2.6 10*3/uL (ref 1.7–7.7)
Neutrophils Relative %: 43 %
PLATELETS: 287 10*3/uL (ref 150–400)
RBC: 3.57 MIL/uL — ABNORMAL LOW (ref 3.87–5.11)
RDW: 12.1 % (ref 11.5–15.5)
WBC: 5.9 10*3/uL (ref 4.0–10.5)

## 2017-04-19 LAB — TROPONIN I: Troponin I: <0.03 ng/mL (ref <0.03)

## 2017-04-19 MED ORDER — CYCLOBENZAPRINE HCL 10 MG PO TABS
10.0000 mg | ORAL_TABLET | Freq: Once | ORAL | Status: AC
  Administered 2017-04-19: 10 mg via ORAL
  Filled 2017-04-19: qty 1

## 2017-04-19 MED ORDER — CYCLOBENZAPRINE HCL 5 MG PO TABS: 5.0000 mg | ORAL_TABLET | Freq: Three times a day (TID) | ORAL | 0 refills | Status: DC | PRN

## 2017-04-19 MED ORDER — NAPROXEN 500 MG PO TABS: ORAL_TABLET | ORAL | 0 refills | Status: DC

## 2017-04-19 MED ORDER — KETOROLAC TROMETHAMINE 30 MG/ML IJ SOLN
30.0000 mg | Freq: Once | INTRAMUSCULAR | Status: AC
  Administered 2017-04-19: 30 mg via INTRAMUSCULAR
  Filled 2017-04-19: qty 1

## 2017-04-19 NOTE — ED Triage Notes (Signed)
Pt reports sharp pains to the center of her chest and toward her left arm, states pain is worse with movement and deep breathing.
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
## 2017-04-19 NOTE — Discharge Instructions (Signed)
Use ice and heat for comfort. Take the medications as prescribed. Keep your appointment with the Neurologist that you already have.

## 2017-04-21 ENCOUNTER — Encounter (HOSPITAL_COMMUNITY): Payer: Self-pay | Admitting: Emergency Medicine

## 2017-04-21 ENCOUNTER — Emergency Department (HOSPITAL_COMMUNITY)
Admission: EM | Admit: 2017-04-21 | Discharge: 2017-04-21 | Disposition: A | Discharge status: Home / Self Care | Payer: Medicaid Other | Attending: Emergency Medicine | Admitting: Emergency Medicine

## 2017-04-21 DIAGNOSIS — R51 Headache: Secondary | ICD-10-CM | POA: Insufficient documentation

## 2017-04-21 DIAGNOSIS — Z79899 Other long term (current) drug therapy: Secondary | ICD-10-CM | POA: Insufficient documentation

## 2017-04-21 DIAGNOSIS — M545 Low back pain: Secondary | ICD-10-CM | POA: Insufficient documentation

## 2017-04-21 DIAGNOSIS — R52 Pain, unspecified: Secondary | ICD-10-CM

## 2017-04-21 DIAGNOSIS — M25511 Pain in right shoulder: Secondary | ICD-10-CM | POA: Insufficient documentation

## 2017-04-21 DIAGNOSIS — M25512 Pain in left shoulder: Secondary | ICD-10-CM | POA: Insufficient documentation

## 2017-04-21 DIAGNOSIS — M542 Cervicalgia: Secondary | ICD-10-CM | POA: Insufficient documentation

## 2017-04-21 DIAGNOSIS — R2242 Localized swelling, mass and lump, left lower limb: Secondary | ICD-10-CM | POA: Insufficient documentation

## 2017-04-21 MED ORDER — CYCLOBENZAPRINE HCL 10 MG PO TABS
10.0000 mg | ORAL_TABLET | Freq: Once | ORAL | Status: AC
  Administered 2017-04-21: 10 mg via ORAL
  Filled 2017-04-21: qty 1

## 2017-04-21 MED ORDER — DEXAMETHASONE SODIUM PHOSPHATE 10 MG/ML IJ SOLN
10.0000 mg | Freq: Once | INTRAMUSCULAR | Status: AC
  Administered 2017-04-21: 10 mg via INTRAMUSCULAR
  Filled 2017-04-21: qty 1

## 2017-04-21 MED ORDER — DICLOFENAC SODIUM 75 MG PO TBEC: 75.0000 mg | DELAYED_RELEASE_TABLET | Freq: Two times a day (BID) | ORAL | 0 refills | Status: AC

## 2017-04-21 MED ORDER — KETOROLAC TROMETHAMINE 60 MG/2ML IM SOLN
30.0000 mg | Freq: Once | INTRAMUSCULAR | Status: AC
  Administered 2017-04-21: 30 mg via INTRAMUSCULAR
  Filled 2017-04-21: qty 2

## 2017-04-21 MED ORDER — CYCLOBENZAPRINE HCL 10 MG PO TABS: 10.0000 mg | ORAL_TABLET | Freq: Two times a day (BID) | ORAL | 0 refills | Status: DC | PRN

## 2017-04-21 MED ORDER — OXYCODONE-ACETAMINOPHEN 5-325 MG PO TABS
1.0000 | ORAL_TABLET | Freq: Once | ORAL | Status: AC
  Administered 2017-04-21: 1 via ORAL
  Filled 2017-04-21: qty 1

## 2017-04-21 MED ORDER — OXYCODONE-ACETAMINOPHEN 5-325 MG PO TABS
ORAL_TABLET | ORAL | Status: AC
  Filled 2017-04-21: qty 1

## 2017-04-21 MED ORDER — OXYCODONE-ACETAMINOPHEN 5-325 MG PO TABS
1.0000 | ORAL_TABLET | ORAL | Status: DC | PRN
  Administered 2017-04-21: 1 via ORAL

## 2017-04-21 NOTE — ED Triage Notes (Signed)
Pt c/o back pain and burning pain to her feet. Pt reports symptoms ongoing for a while, but cannot specify exactly how long.

## 2017-04-21 NOTE — Discharge Instructions (Signed)
Take Voltaren twice daily with food. Take [phone] mg of and Tylenol every 6 hours. Do not exceed 4000 mg of Tylenol daily. Apply ice or heat to the affected areas for comfort. Follow-up with your orthopedist on Tuesday as scheduled. Follow-up with your neurologist on November 6 as scheduled.return to the ED if any concerning signs or symptoms develop.
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
## 2017-04-23 ENCOUNTER — Ambulatory Visit (INDEPENDENT_AMBULATORY_CARE_PROVIDER_SITE_OTHER): Payer: Medicaid Other

## 2017-04-23 ENCOUNTER — Ambulatory Visit (INDEPENDENT_AMBULATORY_CARE_PROVIDER_SITE_OTHER): Payer: Medicaid Other | Admitting: Orthopaedic Surgery

## 2017-04-23 ENCOUNTER — Encounter: Payer: Self-pay | Admitting: Orthopaedic Surgery

## 2017-04-23 VITALS — BP 130/71 | HR 74 | Temp 97.1°F | Ht 67.0 in | Wt 201.0 lb

## 2017-04-23 DIAGNOSIS — G8929 Other chronic pain: Secondary | ICD-10-CM

## 2017-04-23 DIAGNOSIS — M5442 Lumbago with sciatica, left side: Secondary | ICD-10-CM | POA: Diagnosis not present

## 2017-04-23 DIAGNOSIS — G894 Chronic pain syndrome: Secondary | ICD-10-CM | POA: Diagnosis not present
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
## 2017-04-24 ENCOUNTER — Encounter: Payer: Self-pay | Admitting: Orthopaedic Surgery

## 2017-04-24 ENCOUNTER — Telehealth: Payer: Self-pay | Admitting: Orthopaedic Surgery

## 2017-04-24 NOTE — Telephone Encounter (Signed)
OK 

## 2017-04-24 NOTE — Telephone Encounter (Signed)
You saw this patient yesterday and she forgot to ask you about an out of work note through November 9th.  Would you allow an out of work for her?  Please advise  Thanks

## 2017-05-02 ENCOUNTER — Ambulatory Visit (HOSPITAL_COMMUNITY)
Admission: RE | Admit: 2017-05-02 | Discharge: 2017-05-02 | Disposition: A | Discharge status: Home / Self Care | Payer: Medicaid Other | Source: Ambulatory Visit | Attending: Orthopaedic Surgery | Admitting: Orthopaedic Surgery

## 2017-05-02 DIAGNOSIS — M5127 Other intervertebral disc displacement, lumbosacral region: Secondary | ICD-10-CM | POA: Diagnosis not present

## 2017-05-02 DIAGNOSIS — M5137 Other intervertebral disc degeneration, lumbosacral region: Secondary | ICD-10-CM | POA: Insufficient documentation

## 2017-05-02 DIAGNOSIS — G8929 Other chronic pain: Secondary | ICD-10-CM | POA: Diagnosis present

## 2017-05-02 DIAGNOSIS — M5442 Lumbago with sciatica, left side: Secondary | ICD-10-CM

## 2017-05-03 ENCOUNTER — Ambulatory Visit: Payer: Medicaid Other | Admitting: Neurology

## 2017-05-03 ENCOUNTER — Encounter: Payer: Self-pay | Admitting: Neurology

## 2017-05-03 VITALS — BP 141/85 | HR 79 | Ht 67.0 in | Wt 200.0 lb

## 2017-05-03 DIAGNOSIS — G8929 Other chronic pain: Secondary | ICD-10-CM

## 2017-05-03 DIAGNOSIS — M5441 Lumbago with sciatica, right side: Secondary | ICD-10-CM | POA: Diagnosis not present

## 2017-05-03 DIAGNOSIS — S161XXA Strain of muscle, fascia and tendon at neck level, initial encounter: Secondary | ICD-10-CM

## 2017-05-03 DIAGNOSIS — M5442 Lumbago with sciatica, left side: Secondary | ICD-10-CM

## 2017-05-03 HISTORY — DX: Strain of muscle, fascia and tendon at neck level, initial encounter: S16.1XXA

## 2017-05-03 MED ORDER — NORTRIPTYLINE HCL 10 MG PO CAPS: ORAL_CAPSULE | ORAL | 3 refills | Status: DC

## 2017-05-03 NOTE — Patient Instructions (Signed)
   We will start nortriptyline for the neck and back discomfort and get physical therapy. We will get EMG and NCV evaluation for the arms and legs to look at nerve function.
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
## 2017-05-05 LAB — SEDIMENTATION RATE: SED RATE: 16 mm/h (ref 0–32)

## 2017-05-05 LAB — COPPER, SERUM: Copper: 128 ug/dL (ref 72–166)

## 2017-05-05 LAB — HIV ANTIBODY (ROUTINE TESTING W REFLEX): HIV SCREEN 4TH GENERATION: NONREACTIVE

## 2017-05-05 LAB — B. BURGDORFI ANTIBODIES: Lyme IgG/IgM Ab: <0.91 {ISR} (ref 0.00–0.90)

## 2017-05-05 LAB — RPR: RPR: NONREACTIVE

## 2017-05-05 LAB — ANGIOTENSIN CONVERTING ENZYME: Angio Convert Enzyme: 65 U/L (ref 14–82)

## 2017-05-05 LAB — VITAMIN B12: Vitamin B-12: 693 pg/mL (ref 232–1245)

## 2017-05-05 LAB — ANA W/REFLEX: Anti Nuclear Antibody(ANA): NEGATIVE

## 2017-05-05 LAB — RHEUMATOID FACTOR: RHEUMATOID FACTOR: 10.4 [IU]/mL (ref 0.0–13.9)

## 2017-05-07 ENCOUNTER — Encounter: Payer: Self-pay | Admitting: Orthopaedic Surgery

## 2017-05-07 ENCOUNTER — Ambulatory Visit: Payer: Medicaid Other | Admitting: Orthopaedic Surgery

## 2017-05-07 VITALS — BP 129/72 | HR 93 | Temp 98.7°F | Ht 67.0 in | Wt 198.0 lb

## 2017-05-07 DIAGNOSIS — M5442 Lumbago with sciatica, left side: Secondary | ICD-10-CM | POA: Diagnosis not present

## 2017-05-07 DIAGNOSIS — G894 Chronic pain syndrome: Secondary | ICD-10-CM

## 2017-05-07 DIAGNOSIS — G8929 Other chronic pain: Secondary | ICD-10-CM
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
## 2017-05-14 ENCOUNTER — Encounter: Payer: Medicaid Other | Admitting: Neurology

## 2017-05-14 ENCOUNTER — Telehealth: Payer: Self-pay

## 2017-05-14 NOTE — Telephone Encounter (Signed)
Spoke to pt and it has been r/s [employer]

## 2017-05-14 NOTE — Telephone Encounter (Signed)
I called pt, advised her that the NCV/EMG study tech is out today and her appt will need to be rescheduled. Our office will have to call her later to reschedule. Pt verbalized understanding.

## 2017-05-21 ENCOUNTER — Ambulatory Visit (INDEPENDENT_AMBULATORY_CARE_PROVIDER_SITE_OTHER): Payer: Medicaid Other | Admitting: Neurology

## 2017-05-21 ENCOUNTER — Ambulatory Visit: Payer: Medicaid Other | Admitting: Neurology

## 2017-05-21 ENCOUNTER — Encounter: Payer: Self-pay | Admitting: Neurology

## 2017-05-21 DIAGNOSIS — S161XXA Strain of muscle, fascia and tendon at neck level, initial encounter: Secondary | ICD-10-CM

## 2017-05-21 DIAGNOSIS — M5441 Lumbago with sciatica, right side: Secondary | ICD-10-CM | POA: Diagnosis not present

## 2017-05-21 DIAGNOSIS — M5442 Lumbago with sciatica, left side: Secondary | ICD-10-CM | POA: Diagnosis not present

## 2017-05-21 DIAGNOSIS — Z0289 Encounter for other administrative examinations: Secondary | ICD-10-CM

## 2017-05-21 DIAGNOSIS — G8929 Other chronic pain: Secondary | ICD-10-CM

## 2017-05-21 DIAGNOSIS — R202 Paresthesia of skin: Secondary | ICD-10-CM

## 2017-05-21 MED ORDER — NORTRIPTYLINE HCL 25 MG PO CAPS: 50.0000 mg | ORAL_CAPSULE | Freq: Every day | ORAL | 3 refills | Status: DC

## 2017-05-21 NOTE — Progress Notes (Signed)
Please refer to EMG and nerve conduction study procedure note. 

## 2017-05-21 NOTE — Progress Notes (Addendum)
The patient comes in today for EMG nerve conduction study evaluation.  Nerve conduction studies on all 4 extremities were unremarkable, EMG of the right lower extremity was normal, EMG of the right upper extremity shows findings consistent with a chronic, healed right carpal tunnel syndrome.  No evidence of a radiculopathy was seen on the arm or leg.  The patient is on nortriptyline taking 30 mg at night, the dose will be increased to 40 mg for 1 week and then she will go to 50 mg at night.  A prescription was sent in for this medication.   MNC    Nerve / Sites Muscle Latency Ref. Amplitude Ref. Rel Amp Segments Distance Velocity Ref. Area    ms ms mV mV %  cm m/s m/s mVms  L Median - APB     Wrist APB 2.7 ?4.4 21.0 ?4.0 100 Wrist - APB 7   57.1     Upper arm APB 6.2  21.6  102 Upper arm - Wrist 23 65 ?49 55.6  R Median - APB     Wrist APB 2.5 ?4.4 13.2 ?4.0 100 Wrist - APB 7   60.9     Upper arm APB 6.3  13.1  99.1 Upper arm - Wrist 24 64 ?49 65.3  L Ulnar - ADM     Wrist ADM 2.1 ?3.3 12.7 ?6.0 100 Wrist - ADM 7   46.0     B.Elbow ADM 5.8  13.2  105 B.Elbow - Wrist 20 54 ?49 46.4     A.Elbow ADM 7.3  13.6  103 A.Elbow - B.Elbow 10 66 ?49 44.5         A.Elbow - Wrist      R Ulnar - ADM     Wrist ADM 2.3 ?3.3 13.9 ?6.0 100 Wrist - ADM 7   33.6     B.Elbow ADM 5.8  13.9  99.4 B.Elbow - Wrist 21 59 ?49 38.8     A.Elbow ADM 7.3  14.0  101 A.Elbow - B.Elbow 10 66 ?49 39.8         A.Elbow - Wrist      L Peroneal - EDB     Ankle EDB 3.5 ?6.5 4.4 ?2.0 100 Ankle - EDB 9   8.0     Fib head EDB 10.2  3.1  70.8 Fib head - Ankle 36 54 ?44 9.5     Pop fossa EDB 11.7  3.9  126 Pop fossa - Fib head 10 66 ?44 7.4         Pop fossa - Ankle      R Peroneal - EDB     Ankle EDB 2.6 ?6.5 10.5 ?2.0 100 Ankle - EDB 9   30.7     Fib head EDB 9.3  9.4  89.3 Fib head - Ankle 37 55 ?44 27.9     Pop fossa EDB 11.0  9.3  98.6 Pop fossa - Fib head 10 58 ?44 29.0         Pop fossa - Ankle      L Tibial - AH   Ankle AH 4.6 ?5.8 10.4 ?4.0 100 Ankle - AH 9   25.8     Pop fossa AH 12.3  9.9  95.3 Pop fossa - Ankle 44 57 ?41 28.7  R Tibial - AH     Ankle AH 4.7 ?5.8 12.5 ?4.0 100 Ankle - AH 9   26.7     Pop fossa AH 12.9  11.3  90.5 Pop fossa - Ankle 44 54 ?41 28.9                     SNC    Nerve / Sites Rec. Site Peak Lat Amp Segments Distance    ms V  cm  L Median - Orthodromic (Dig II, Mid palm)     Dig II Wrist 2.6 100 Dig II - Wrist 13  R Median - Orthodromic (Dig II, Mid palm)     Dig II Wrist 2.9 96 Dig II - Wrist 13  L Ulnar - Orthodromic, (Dig V, Mid palm)     Dig V Wrist 2.4 105 Dig V - Wrist 11  R Ulnar - Orthodromic, (Dig V, Mid palm)     Dig V Wrist 2.7 97 Dig V - Wrist 11             SNC    Nerve / Sites Rec. Site Peak Lat Ref.  Amp Ref. Segments Distance    ms ms V V  cm  R Superficial peroneal - Ankle     Lat leg Ankle 2.0 ?4.4 59 ?6 Lat leg - Ankle 14  L Superficial peroneal - Ankle     Lat leg Ankle 1.9 ?4.4 60 ?6 Lat leg - Ankle 14         F  Wave    Nerve F Lat Ref.   ms ms  L Median - APB 23.9 ?31.0  L Ulnar - ADM 25.2 ?32.0  R Median - APB 26.4 ?31.0  R Ulnar - ADM 27.1 ?32.0             H Reflex    Nerve H Lat Lat Hmax   ms ms   Left Right Ref. Left Right Ref.  Tibial - Soleus 31.3 31.7 ?35.0 25.2 32.6 ?35.0         EMG
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
## 2017-05-27 ENCOUNTER — Ambulatory Visit: Payer: Medicaid Other | Attending: Neurology | Admitting: Physical Therapy

## 2017-05-27 ENCOUNTER — Telehealth: Payer: Self-pay | Admitting: Neurology

## 2017-05-27 ENCOUNTER — Encounter: Payer: Self-pay | Admitting: Physical Therapy

## 2017-05-27 DIAGNOSIS — M542 Cervicalgia: Secondary | ICD-10-CM | POA: Insufficient documentation

## 2017-05-27 DIAGNOSIS — G8929 Other chronic pain: Secondary | ICD-10-CM | POA: Diagnosis present

## 2017-05-27 DIAGNOSIS — M5442 Lumbago with sciatica, left side: Secondary | ICD-10-CM | POA: Insufficient documentation

## 2017-05-27 DIAGNOSIS — R2689 Other abnormalities of gait and mobility: Secondary | ICD-10-CM | POA: Diagnosis present

## 2017-05-27 DIAGNOSIS — S39012D Strain of muscle, fascia and tendon of lower back, subsequent encounter: Secondary | ICD-10-CM

## 2017-05-27 MED ORDER — GABAPENTIN 300 MG PO CAPS: 300.0000 mg | ORAL_CAPSULE | Freq: Three times a day (TID) | ORAL | 3 refills | Status: DC

## 2017-05-27 MED ORDER — MELOXICAM 15 MG PO TABS: 15.0000 mg | ORAL_TABLET | Freq: Every day | ORAL | 2 refills | Status: DC

## 2017-05-27 NOTE — Addendum Note (Signed)
Addended by: York SpanielWILLIS, CHARLES K on: 05/27/2017 05:31 PM   Modules accepted: Orders

## 2017-05-27 NOTE — Telephone Encounter (Signed)
Patient is calling stating she just left PT and her back is really hurting. She is taking nortriptyline (PAMELOR) 25 MG capsule but has not helped. Please call to discuss. She uses [facility] on [address] in [place].
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
## 2017-05-27 NOTE — Telephone Encounter (Signed)
I called the patient.  The patient has had ongoing pain with the back.  She has gotten into physical therapy.  She claims that physical therapy recommends a rolling walker, I do not see that in the note.  I will write a prescription for a rolling walker.  The patient will be placed back on Mobic 15 mg daily, she will take gabapentin 300 mg 3 times a day, she had run out of the medication.  We may need to increase the gabapentin dose over time.

## 2017-05-28 NOTE — Telephone Encounter (Signed)
Faxed printed/signed rx for rolling walker with seat [facility] at [phone]. Received fax confirmation.

## 2017-05-28 NOTE — Telephone Encounter (Signed)
Pt went to [facility]. She is wanting rolling walker with a seat. [facility] advised they need RX with walker with a seat faxed to [phone]. Please call the pt when the RX has been faxed.

## 2017-05-28 NOTE — Addendum Note (Signed)
Addended by: York SpanielWILLIS, Akshita Italiano K on: 05/28/2017 04:55 PM   Modules accepted: Orders

## 2017-05-28 NOTE — Telephone Encounter (Signed)
Called pt. She will pick up printed rx for rolling walker. Placed up front for pt pick up.

## 2017-05-28 NOTE — Telephone Encounter (Signed)
I will write a prescription for a rolling walker with a seat.

## 2017-05-29 NOTE — Telephone Encounter (Signed)
I had left message for patient to call me back regarding precious message. Patient called back and said [facility] did receive order for a rolling walker but said to keep order up front in case of a problem.

## 2017-05-29 NOTE — Telephone Encounter (Signed)
Noted  

## 2017-05-29 NOTE — Telephone Encounter (Signed)
Placed order up front for pt pick up. Veronica [name]. Will call pt to let her know ready for pick up.

## 2017-05-29 NOTE — Telephone Encounter (Signed)
Patient calling back stating [facility] did not receive order for rolling walker. The patient said she would come and pick up order and take to them. Please call when ready for patient to pick up.

## 2017-05-30 ENCOUNTER — Telehealth: Payer: Self-pay | Admitting: Radiology

## 2017-05-30 NOTE — Telephone Encounter (Signed)
Patient states paper work was sent over from [employer], was to be faxed here on Friday. Have we received it yet?

## 2017-05-31 NOTE — Telephone Encounter (Signed)
I called back to patient; she returned call w/short-term disability insurer [employer] on 3-way call w/representative Kriste Basque; states letter was sent 05/21/17, however, never received; therefore, faxed copy to follow, w/2 questions to be answered; relayed it will be completed and returned upon receipt. Patient aware.

## 2017-06-06 MED ORDER — GABAPENTIN 600 MG PO TABS: 600.0000 mg | ORAL_TABLET | Freq: Three times a day (TID) | ORAL | 4 refills | Status: DC
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
## 2017-06-06 NOTE — Telephone Encounter (Signed)
Pt called stating that Dr Anne Hahn informed her that he would increase her gabapentin (NEURONTIN) 300 MG capsule to 600 MG, Pt is also asking that Dr Anne Hahn calls her to discuss concerns about her foot

## 2017-06-06 NOTE — Addendum Note (Signed)
Addended by: York Spaniel on: 06/06/2017 09:39 AM   Modules accepted: Orders

## 2017-06-13 ENCOUNTER — Ambulatory Visit: Payer: Medicaid Other | Admitting: Physical Therapy

## 2017-06-13 ENCOUNTER — Encounter: Payer: Self-pay | Admitting: Physical Therapy

## 2017-06-13 DIAGNOSIS — M5442 Lumbago with sciatica, left side: Secondary | ICD-10-CM

## 2017-06-13 DIAGNOSIS — G8929 Other chronic pain: Secondary | ICD-10-CM

## 2017-06-13 DIAGNOSIS — M542 Cervicalgia: Secondary | ICD-10-CM | POA: Diagnosis not present

## 2017-06-13 NOTE — Patient Instructions (Addendum)
Upper Cervical Rotation    Rotate head slowly from side to side as if saying "no". Do not turn head completely to either side. Keep motion small. Repeat ___10_ times per set. Do _1___ sets per session. Do __2__ sessions per day.    FOR STRETCH;  USE TOWEL - place around neck - cross - use one hand to stabilize, use other hand to gently along jaw line  pull head to left side to stretch Rt side - HOLD 30-45 secs as able to tolerate   http://orth.exer.us/375 AROM: Lateral Neck Flexion    Slowly tilt head toward one shoulder, then the other. Hold each position _2___ seconds. Repeat __10__ times per set. Do ___1 sets per session. Do _2___ sessions per day.  FOR STRETCH:  Use left hand to pull head down toward left shoulder - keep Rt shoulder down for more of a stretch - can put arm out to side for increased stretch if able to tolerate- HOLD 30-45 secs 1-2 reps as able to tolerate  http://orth.exer.us/297   ALSO GAVE PT BACK EXERCISE PACKET - verbally reviewed exercises - eliminated abdominal crunches and wall squats
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
## 2017-06-20 ENCOUNTER — Ambulatory Visit: Payer: Medicaid Other | Admitting: Physical Therapy

## 2017-06-27 ENCOUNTER — Ambulatory Visit: Payer: Medicaid Other | Admitting: Physical Therapy

## 2017-07-02 ENCOUNTER — Encounter: Payer: Self-pay | Admitting: Physical Therapy

## 2017-07-02 ENCOUNTER — Ambulatory Visit: Payer: Medicaid Other | Attending: Neurology | Admitting: Physical Therapy

## 2017-07-02 DIAGNOSIS — M542 Cervicalgia: Secondary | ICD-10-CM | POA: Diagnosis present

## 2017-07-02 DIAGNOSIS — R2689 Other abnormalities of gait and mobility: Secondary | ICD-10-CM | POA: Insufficient documentation

## 2017-07-02 DIAGNOSIS — M5442 Lumbago with sciatica, left side: Secondary | ICD-10-CM | POA: Diagnosis present

## 2017-07-02 DIAGNOSIS — G8929 Other chronic pain: Secondary | ICD-10-CM

## 2017-07-03 ENCOUNTER — Encounter: Payer: Self-pay | Admitting: Orthopaedic Surgery

## 2017-07-03 ENCOUNTER — Ambulatory Visit (INDEPENDENT_AMBULATORY_CARE_PROVIDER_SITE_OTHER): Payer: Medicaid Other | Admitting: Orthopaedic Surgery

## 2017-07-03 ENCOUNTER — Encounter: Payer: Self-pay | Admitting: Orthopedic Surgery

## 2017-07-03 VITALS — BP 129/61 | HR 78 | Ht 67.0 in | Wt 205.0 lb

## 2017-07-03 DIAGNOSIS — G894 Chronic pain syndrome: Secondary | ICD-10-CM

## 2017-07-03 DIAGNOSIS — G8929 Other chronic pain: Secondary | ICD-10-CM | POA: Diagnosis not present

## 2017-07-03 DIAGNOSIS — M5442 Lumbago with sciatica, left side: Secondary | ICD-10-CM

## 2017-07-03 MED ORDER — TRAMADOL HCL 50 MG PO TABS: 50.0000 mg | ORAL_TABLET | Freq: Four times a day (QID) | ORAL | 1 refills | Status: DC | PRN
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
## 2017-07-04 ENCOUNTER — Telehealth: Payer: Self-pay | Admitting: Radiology

## 2017-07-04 NOTE — Telephone Encounter (Signed)
We received a pre-authorization request from her pharmacy for Tramadol.  I called the patient and explained that Dr. Hilda Lias will not do the pre-auth for pain medicine.  She will have to get a new prescription from her family doctor and have them get the pre-auth if they will.  I explained that the family doctor monitors their overall health and is best suited to know if the pain medication is needed.  They have a better chance with the insurance in answering the questions asked in order to obtain the pre-authorization. She understands and will contact the family doctor.

## 2017-07-11 ENCOUNTER — Encounter: Payer: Self-pay | Admitting: Physical Therapy

## 2017-07-11 ENCOUNTER — Ambulatory Visit: Payer: Medicaid Other | Admitting: Physical Therapy

## 2017-07-11 DIAGNOSIS — G8929 Other chronic pain: Secondary | ICD-10-CM

## 2017-07-11 DIAGNOSIS — R2689 Other abnormalities of gait and mobility: Secondary | ICD-10-CM

## 2017-07-11 DIAGNOSIS — M542 Cervicalgia: Secondary | ICD-10-CM

## 2017-07-11 DIAGNOSIS — M5442 Lumbago with sciatica, left side: Principal | ICD-10-CM
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
## 2017-07-16 ENCOUNTER — Ambulatory Visit: Payer: Medicaid Other | Admitting: Physical Therapy

## 2017-08-01 ENCOUNTER — Telehealth: Payer: Self-pay | Admitting: Orthopaedic Surgery

## 2017-08-01 NOTE — Telephone Encounter (Signed)
Patient requests refill: traMADol (ULTRAM) 50 MG tablet -  pharmacy is [facility], [place]

## 2017-08-06 MED ORDER — TRAMADOL HCL 50 MG PO TABS: 50.0000 mg | ORAL_TABLET | Freq: Four times a day (QID) | ORAL | 1 refills | Status: DC | PRN

## 2017-08-06 NOTE — Telephone Encounter (Signed)
Patient aware - confirmed received by pharmacy.

## 2017-08-14 ENCOUNTER — Ambulatory Visit: Payer: Medicaid Other | Admitting: Orthopaedic Surgery

## 2017-08-14 ENCOUNTER — Encounter: Payer: Self-pay | Admitting: Orthopaedic Surgery

## 2017-08-14 VITALS — BP 127/69 | Temp 98.1°F | Ht 67.0 in | Wt 209.2 lb

## 2017-08-14 DIAGNOSIS — G894 Chronic pain syndrome: Secondary | ICD-10-CM | POA: Diagnosis not present

## 2017-08-14 DIAGNOSIS — G8929 Other chronic pain: Secondary | ICD-10-CM | POA: Diagnosis not present

## 2017-08-14 DIAGNOSIS — M5442 Lumbago with sciatica, left side: Secondary | ICD-10-CM | POA: Diagnosis not present

## 2017-08-14 MED ORDER — PREDNISONE 5 MG (21) PO TBPK: ORAL_TABLET | ORAL | 0 refills | Status: DC
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
## 2017-08-29 ENCOUNTER — Encounter: Payer: Self-pay | Admitting: Adult Health

## 2017-08-29 ENCOUNTER — Ambulatory Visit: Payer: Medicaid Other | Admitting: Adult Health

## 2017-08-29 VITALS — BP 138/74 | HR 91 | Ht 67.0 in | Wt 211.4 lb

## 2017-08-29 DIAGNOSIS — G8929 Other chronic pain: Secondary | ICD-10-CM

## 2017-08-29 DIAGNOSIS — S161XXD Strain of muscle, fascia and tendon at neck level, subsequent encounter: Secondary | ICD-10-CM | POA: Diagnosis not present

## 2017-08-29 DIAGNOSIS — M5442 Lumbago with sciatica, left side: Secondary | ICD-10-CM

## 2017-08-29 DIAGNOSIS — M5441 Lumbago with sciatica, right side: Secondary | ICD-10-CM | POA: Diagnosis not present

## 2017-08-29 NOTE — Patient Instructions (Signed)
Your Plan:  Continue gabapentin 600 mg three times a day Decrease nortriptyline to 25 mg at bedtime- if discomfort worsens increase back to 50 mg  Continue Mobic If your symptoms worsen or you develop new symptoms please let us know.   Thank you for coming to see [place] at [facility]. I hope we have been able to provide you high quality care today.  You may receive a patient satisfaction survey over the next few weeks. We would appreciate your feedback and comments so that we may continue to improve ourselves and the health of our patients.
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
## 2017-08-29 NOTE — Progress Notes (Signed)
I have read the note, and I agree with the clinical assessment and plan.  Mileydi Milsap K Teagyn Fishel   

## 2017-09-02 ENCOUNTER — Other Ambulatory Visit: Payer: Self-pay | Admitting: Orthopaedic Surgery

## 2017-09-02 MED ORDER — TRAMADOL HCL 50 MG PO TABS: 50.0000 mg | ORAL_TABLET | Freq: Four times a day (QID) | ORAL | 1 refills | Status: DC | PRN

## 2017-09-02 NOTE — Telephone Encounter (Signed)
Patient requests refill of:  traMADol (ULTRAM) 50 MG tablet 60 tablet  - uses [facility], [facility]

## 2017-09-16 ENCOUNTER — Encounter: Payer: Self-pay | Admitting: Orthopaedic Surgery

## 2017-09-18 ENCOUNTER — Ambulatory Visit: Payer: Medicaid Other | Admitting: Physical Therapy

## 2017-09-20 ENCOUNTER — Ambulatory Visit (HOSPITAL_COMMUNITY): Payer: Medicaid Other

## 2017-09-24 ENCOUNTER — Ambulatory Visit (HOSPITAL_COMMUNITY): Payer: Medicaid Other

## 2017-09-24 ENCOUNTER — Telehealth (HOSPITAL_COMMUNITY): Payer: Self-pay | Admitting: Family

## 2017-09-24 NOTE — Telephone Encounter (Signed)
09/24/17  pt called and asked if she could just reschedule until tomorrow

## 2017-09-25 ENCOUNTER — Ambulatory Visit (HOSPITAL_COMMUNITY): Payer: Medicaid Other | Attending: Family

## 2017-09-30 DIAGNOSIS — Z0271 Encounter for disability determination: Secondary | ICD-10-CM

## 2017-10-01 ENCOUNTER — Other Ambulatory Visit: Payer: Self-pay | Admitting: Orthopedic Surgery

## 2017-10-14 ENCOUNTER — Other Ambulatory Visit: Payer: Self-pay | Admitting: Orthopedic Surgery

## 2017-10-29 ENCOUNTER — Other Ambulatory Visit: Payer: Self-pay | Admitting: Orthopedic Surgery

## 2017-11-11 ENCOUNTER — Other Ambulatory Visit: Payer: Self-pay | Admitting: Orthopedic Surgery

## 2017-11-12 NOTE — Telephone Encounter (Signed)
Not seen in three months.  I cannot give narcotic until seen.

## 2017-11-13 ENCOUNTER — Encounter: Payer: Self-pay | Admitting: Orthopaedic Surgery

## 2017-11-13 ENCOUNTER — Ambulatory Visit: Payer: Medicaid Other | Admitting: Orthopaedic Surgery

## 2017-11-13 VITALS — BP 115/80 | HR 64 | Ht 67.0 in | Wt 209.0 lb

## 2017-11-13 DIAGNOSIS — M5442 Lumbago with sciatica, left side: Secondary | ICD-10-CM | POA: Diagnosis not present

## 2017-11-13 DIAGNOSIS — G894 Chronic pain syndrome: Secondary | ICD-10-CM | POA: Diagnosis not present

## 2017-11-13 DIAGNOSIS — G8929 Other chronic pain: Secondary | ICD-10-CM | POA: Diagnosis not present

## 2017-11-13 MED ORDER — TRAMADOL HCL 50 MG PO TABS: ORAL_TABLET | ORAL | 0 refills | Status: DC
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
## 2017-11-14 ENCOUNTER — Telehealth: Payer: Self-pay | Admitting: Orthopaedic Surgery

## 2017-11-14 NOTE — Telephone Encounter (Signed)
At time of visit, 11/13/17, Dr Hilda Lias completed forms for [facility] disability form for patient; signed authorization on file.  Copy of form retained for office and for scanning; original requested by patient to take to her local office - states it is for her car loan.   Today, 11/14/17, [place] representative, Caralee Ates, called - ph# [phone], extension [phone], fax [phone], reference claim# [id number], via voice message 12:35PM - States that the patient was issued a not to remain out of work until next scheduled visit, which is not what is indicated on Dr Sanjuan Dame form.  I therefore spoke with Dr Hilda Lias, and he verified that the form is correctly stated - not disabled from work.    I am voiding the letter, and I called patient to relay that Dr Hilda Lias did not declare her to be out of work and also that am relaying this information to [place]. She said she was not sure how her workplace, [employer], will handle having her come back. She mentioned she has a scheduled appointment with Dr Gerilyn Pilgrim on 11/20/17, and will talk with him once she is seen by him.  I called back to Mr Fidela Juneau at [employer] to relay per Dr Hilda Lias that form is correct, and to disregard the work note, which we are voiding, as I issued it in error - basing it on what patient understood at the time of check out from visit 11/13/17. Mr Fidela Juneau also made reference to section 4 of the form in which some items were circled. I further inquired as I was not seeing items circled on section 4. He went on to read the 4 sections, then suggested that he fax [place] a copy of the form he received via fax. States he will include some questions to be answered.  I relayed to him that Dr Hilda Lias will return to office Tuesday, 11/19/17.  Mr Fidela Juneau also requested that the original completed by Dr Hilda Lias be faxed, which I have done. All paperwork has been received and placed in Dr Sanjuan Dame box for review upon his return to clinic.

## 2017-11-20 NOTE — Telephone Encounter (Signed)
On 11/19/17 upon Dr Sanjuan Dame return, questionnaire was reviewed, completed, and faxed to [employer], attention representative Caralee Ates. Confirmed received.
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
## 2018-01-02 ENCOUNTER — Other Ambulatory Visit (HOSPITAL_COMMUNITY): Payer: Self-pay | Admitting: Neurology

## 2018-01-02 DIAGNOSIS — G35 Multiple sclerosis: Secondary | ICD-10-CM

## 2018-01-09 ENCOUNTER — Ambulatory Visit (HOSPITAL_COMMUNITY)
Admission: RE | Admit: 2018-01-09 | Discharge: 2018-01-09 | Disposition: A | Discharge status: Home / Self Care | Payer: Medicaid Other | Source: Ambulatory Visit | Attending: Neurology | Admitting: Neurology

## 2018-01-09 DIAGNOSIS — G35 Multiple sclerosis: Secondary | ICD-10-CM | POA: Diagnosis not present

## 2018-01-09 DIAGNOSIS — R200 Anesthesia of skin: Secondary | ICD-10-CM | POA: Insufficient documentation

## 2018-01-09 DIAGNOSIS — M7989 Other specified soft tissue disorders: Secondary | ICD-10-CM | POA: Diagnosis not present

## 2018-02-13 ENCOUNTER — Ambulatory Visit: Payer: Medicaid Other | Admitting: Orthopaedic Surgery

## 2018-02-13 ENCOUNTER — Encounter: Payer: Self-pay | Admitting: Orthopaedic Surgery

## 2018-03-05 ENCOUNTER — Telehealth: Payer: Self-pay | Admitting: Neurology

## 2018-03-05 ENCOUNTER — Ambulatory Visit: Payer: Medicaid Other | Admitting: Neurology

## 2018-03-05 ENCOUNTER — Encounter: Payer: Self-pay | Admitting: Neurology

## 2018-03-05 NOTE — Telephone Encounter (Signed)
This patient did not show for a revisit appointment today. 

## 2018-03-16 ENCOUNTER — Encounter (HOSPITAL_COMMUNITY): Payer: Self-pay | Admitting: Emergency Medicine

## 2018-03-16 ENCOUNTER — Other Ambulatory Visit: Payer: Self-pay

## 2018-03-16 DIAGNOSIS — Z79899 Other long term (current) drug therapy: Secondary | ICD-10-CM | POA: Diagnosis not present

## 2018-03-16 DIAGNOSIS — N751 Abscess of Bartholin's gland: Secondary | ICD-10-CM | POA: Diagnosis not present

## 2018-03-16 DIAGNOSIS — N7689 Other specified inflammation of vagina and vulva: Secondary | ICD-10-CM | POA: Diagnosis present

## 2018-03-16 NOTE — ED Triage Notes (Signed)
Pt reports abscess to labia near vaginal opening. States she had purulent drainage after warm compress.

## 2018-03-17 ENCOUNTER — Ambulatory Visit: Payer: Medicaid Other | Admitting: Neurology

## 2018-03-17 ENCOUNTER — Emergency Department (HOSPITAL_COMMUNITY)
Admission: EM | Admit: 2018-03-17 | Discharge: 2018-03-17 | Disposition: A | Discharge status: Home / Self Care | Payer: Medicaid Other | Attending: Emergency Medicine | Admitting: Emergency Medicine

## 2018-03-17 ENCOUNTER — Telehealth: Payer: Self-pay | Admitting: Neurology

## 2018-03-17 DIAGNOSIS — N751 Abscess of Bartholin's gland: Secondary | ICD-10-CM

## 2018-03-17 HISTORY — DX: Fibromyalgia: M79.7

## 2018-03-17 MED ORDER — DOXYCYCLINE HYCLATE 100 MG PO TABS
100.0000 mg | ORAL_TABLET | Freq: Once | ORAL | Status: AC
  Administered 2018-03-17: 100 mg via ORAL
  Filled 2018-03-17: qty 1

## 2018-03-17 MED ORDER — LIDOCAINE HCL (PF) 2 % IJ SOLN
INTRAMUSCULAR | Status: AC
  Administered 2018-03-17: 10 mL
  Filled 2018-03-17: qty 20

## 2018-03-17 MED ORDER — DOXYCYCLINE HYCLATE 100 MG PO CAPS: 100.0000 mg | ORAL_CAPSULE | Freq: Two times a day (BID) | ORAL | 0 refills | Status: DC

## 2018-03-17 MED ORDER — LIDOCAINE HCL (PF) 2 % IJ SOLN
10.0000 mL | Freq: Once | INTRAMUSCULAR | Status: AC
  Administered 2018-03-17: 10 mL
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
## 2018-03-19 ENCOUNTER — Other Ambulatory Visit: Payer: Self-pay

## 2018-03-19 ENCOUNTER — Emergency Department (HOSPITAL_COMMUNITY)
Admission: EM | Admit: 2018-03-19 | Discharge: 2018-03-19 | Disposition: A | Discharge status: Home / Self Care | Payer: Medicaid Other | Attending: Emergency Medicine | Admitting: Emergency Medicine

## 2018-03-19 ENCOUNTER — Encounter: Payer: Self-pay | Admitting: Neurology

## 2018-03-19 ENCOUNTER — Emergency Department (HOSPITAL_COMMUNITY): Payer: Medicaid Other

## 2018-03-19 ENCOUNTER — Encounter (HOSPITAL_COMMUNITY): Payer: Self-pay | Admitting: Emergency Medicine

## 2018-03-19 DIAGNOSIS — R109 Unspecified abdominal pain: Secondary | ICD-10-CM | POA: Insufficient documentation

## 2018-03-19 DIAGNOSIS — N739 Female pelvic inflammatory disease, unspecified: Secondary | ICD-10-CM | POA: Insufficient documentation

## 2018-03-19 DIAGNOSIS — R1030 Lower abdominal pain, unspecified: Secondary | ICD-10-CM | POA: Diagnosis present

## 2018-03-19 DIAGNOSIS — F909 Attention-deficit hyperactivity disorder, unspecified type: Secondary | ICD-10-CM | POA: Insufficient documentation

## 2018-03-19 DIAGNOSIS — Z79899 Other long term (current) drug therapy: Secondary | ICD-10-CM | POA: Diagnosis not present

## 2018-03-19 LAB — COMPREHENSIVE METABOLIC PANEL
ALBUMIN: 3.8 g/dL (ref 3.5–5.0)
ALK PHOS: 46 U/L (ref 38–126)
ALT: 14 U/L (ref 0–44)
ANION GAP: 11 (ref 5–15)
AST: 19 U/L (ref 15–41)
BILIRUBIN TOTAL: 0.5 mg/dL (ref 0.3–1.2)
BUN: 10 mg/dL (ref 6–20)
CHLORIDE: 106 mmol/L (ref 98–111)
CO2: 23 mmol/L (ref 22–32)
Calcium: 8.7 mg/dL — ABNORMAL LOW (ref 8.9–10.3)
Creatinine, Ser: 0.56 mg/dL (ref 0.44–1.00)
GFR calc Af Amer: >60 mL/min (ref >60)
GFR calc non Af Amer: >60 mL/min (ref >60)
Glucose, Bld: 116 mg/dL — ABNORMAL HIGH (ref 70–99)
POTASSIUM: 3.1 mmol/L — AB (ref 3.5–5.1)
SODIUM: 140 mmol/L (ref 135–145)
Total Protein: 7.6 g/dL (ref 6.5–8.1)

## 2018-03-19 LAB — HCG, QUANTITATIVE, PREGNANCY: hCG, Beta Chain, Quant, S: <1 m[IU]/mL (ref <5)

## 2018-03-19 LAB — LIPASE, BLOOD: Lipase: 19 U/L (ref 11–51)

## 2018-03-19 LAB — URINALYSIS, ROUTINE W REFLEX MICROSCOPIC
LEUKOCYTES UA: NEGATIVE
Nitrite: NEGATIVE

## 2018-03-19 LAB — URINALYSIS, MICROSCOPIC (REFLEX): Squamous Epithelial / LPF: NONE SEEN (ref 0–5)

## 2018-03-19 LAB — CBC
HEMATOCRIT: 36 % (ref 36.0–46.0)
HEMOGLOBIN: 12 g/dL (ref 12.0–15.0)
MCH: 29.1 pg (ref 26.0–34.0)
MCHC: 33.3 g/dL (ref 30.0–36.0)
MCV: 87.4 fL (ref 78.0–100.0)
Platelets: 264 10*3/uL (ref 150–400)
RBC: 4.12 MIL/uL (ref 3.87–5.11)
RDW: 13.9 % (ref 11.5–15.5)
WBC: 6.6 10*3/uL (ref 4.0–10.5)

## 2018-03-19 LAB — WET PREP, GENITAL
Sperm: NONE SEEN
TRICH WET PREP: NONE SEEN
WBC WET PREP: NONE SEEN
Yeast Wet Prep HPF POC: NONE SEEN

## 2018-03-19 MED ORDER — KETOROLAC TROMETHAMINE 30 MG/ML IJ SOLN
30.0000 mg | Freq: Once | INTRAMUSCULAR | Status: AC
  Administered 2018-03-19: 30 mg via INTRAVENOUS
  Filled 2018-03-19: qty 1

## 2018-03-19 MED ORDER — HYDROMORPHONE HCL 1 MG/ML IJ SOLN: 1.0000 mg | Freq: Once | INTRAMUSCULAR | Status: DC

## 2018-03-19 MED ORDER — ONDANSETRON HCL 4 MG PO TABS: 4.0000 mg | ORAL_TABLET | Freq: Three times a day (TID) | ORAL | 0 refills | Status: DC | PRN

## 2018-03-19 MED ORDER — MORPHINE SULFATE (PF) 4 MG/ML IV SOLN
4.0000 mg | Freq: Once | INTRAVENOUS | Status: AC
  Administered 2018-03-19: 4 mg via INTRAVENOUS
  Filled 2018-03-19: qty 1

## 2018-03-19 MED ORDER — CEFTRIAXONE SODIUM 250 MG IJ SOLR
250.0000 mg | Freq: Once | INTRAMUSCULAR | Status: AC
  Administered 2018-03-19: 250 mg via INTRAMUSCULAR
  Filled 2018-03-19: qty 250

## 2018-03-19 MED ORDER — SODIUM CHLORIDE 0.9 % IV BOLUS
1000.0000 mL | Freq: Once | INTRAVENOUS | Status: AC
  Administered 2018-03-19: 1000 mL via INTRAVENOUS

## 2018-03-19 MED ORDER — ONDANSETRON HCL 4 MG/2ML IJ SOLN
4.0000 mg | Freq: Once | INTRAMUSCULAR | Status: AC
  Administered 2018-03-19: 4 mg via INTRAVENOUS
  Filled 2018-03-19: qty 2

## 2018-03-19 MED ORDER — AZITHROMYCIN 250 MG PO TABS
1000.0000 mg | ORAL_TABLET | Freq: Once | ORAL | Status: AC
  Administered 2018-03-19: 1000 mg via ORAL
  Filled 2018-03-19: qty 4

## 2018-03-19 MED ORDER — LIDOCAINE HCL (PF) 1 % IJ SOLN
INTRAMUSCULAR | Status: AC
  Administered 2018-03-19: 2 mL
  Filled 2018-03-19: qty 2

## 2018-03-19 MED ORDER — AZITHROMYCIN 500 MG PO TABS: 1000.0000 mg | ORAL_TABLET | Freq: Once | ORAL | 0 refills | Status: AC

## 2018-03-19 MED ORDER — IOPAMIDOL (ISOVUE-300) INJECTION 61%
100.0000 mL | Freq: Once | INTRAVENOUS | Status: AC | PRN
  Administered 2018-03-19: 100 mL via INTRAVENOUS

## 2018-03-19 NOTE — ED Notes (Signed)
Patient transported to CT 

## 2018-03-19 NOTE — Discharge Instructions (Addendum)
You were given a prescription for antibiotics. Please take the antibiotic prescription fully. You will need to take the antibiotic on 03/26/18. You were also give a prescription for a nausea medication called zofran.   Please follow-up with the OB/GYN doctor in regards to your python cyst and in regards to the cyst that was noted on your CT scan.  Please follow up with your primary doctor within the next 5-7 days.  If you do not have a primary care provider, information for a healthcare clinic has been provided for you to make arrangements for follow up care. Please return to the ER sooner if you have any new or worsening symptoms, or if you have any of the following symptoms:  Abdominal pain that does not go away.  You have a fever.  You keep throwing up (vomiting).  The pain is felt only in portions of the abdomen. Pain in the right side could possibly be appendicitis. In an adult, pain in the left lower portion of the abdomen could be colitis or diverticulitis.  You pass bloody or black tarry stools.  There is bright red blood in the stool.  The constipation stays for more than 4 days.  There is belly (abdominal) or rectal pain.  You do not seem to be getting better.  You have any questions or concerns.
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
## 2018-03-19 NOTE — ED Triage Notes (Addendum)
Pt was seen on Sunday for ingrown hair, put on doxycycline.   N/v/d started on Monday and increased lower ABD pain since

## 2018-03-20 LAB — GC/CHLAMYDIA PROBE AMP (~~LOC~~) NOT AT ARMC
CHLAMYDIA, DNA PROBE: NEGATIVE
Neisseria Gonorrhea: NEGATIVE

## 2018-03-21 LAB — URINE CULTURE: Culture: NO GROWTH

## 2018-03-24 ENCOUNTER — Telehealth: Payer: Self-pay | Admitting: *Deleted

## 2018-03-24 NOTE — Telephone Encounter (Signed)
Called pt since she was on wait list and offered appt with Dr. Anne Hahn tomorrow at 12pm, check in 1145am. Pt accepted. I scheduled her.

## 2018-03-25 ENCOUNTER — Other Ambulatory Visit: Payer: Self-pay

## 2018-03-25 ENCOUNTER — Ambulatory Visit: Payer: Self-pay | Admitting: Neurology

## 2018-03-25 ENCOUNTER — Ambulatory Visit (INDEPENDENT_AMBULATORY_CARE_PROVIDER_SITE_OTHER): Payer: Medicaid Other | Admitting: Obstetrics & Gynecology

## 2018-03-25 ENCOUNTER — Encounter: Payer: Self-pay | Admitting: Obstetrics & Gynecology

## 2018-03-25 VITALS — BP 143/73 | HR 85 | Ht 66.0 in | Wt 196.0 lb

## 2018-03-25 DIAGNOSIS — N750 Cyst of Bartholin's gland: Secondary | ICD-10-CM | POA: Diagnosis not present

## 2018-03-25 NOTE — Telephone Encounter (Signed)
This represents the third no-show for this patient.  She did not show for appointments on 23 September and on 05 March 2018.  The patient will be discharged from our practice.
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
## 2018-03-25 NOTE — Telephone Encounter (Signed)
Dr. Anne Hahn- how would you like to handle this?

## 2018-03-26 ENCOUNTER — Telehealth: Payer: Self-pay | Admitting: Neurology

## 2018-03-26 NOTE — Telephone Encounter (Signed)
FYI- Patient has 3 no-shows in 2019.

## 2018-04-03 ENCOUNTER — Encounter: Payer: Self-pay | Admitting: Neurology

## 2018-05-14 ENCOUNTER — Other Ambulatory Visit: Payer: Self-pay

## 2018-05-14 ENCOUNTER — Encounter (HOSPITAL_COMMUNITY): Payer: Self-pay | Admitting: Emergency Medicine

## 2018-05-14 ENCOUNTER — Emergency Department (HOSPITAL_COMMUNITY)
Admission: EM | Admit: 2018-05-14 | Discharge: 2018-05-14 | Disposition: A | Discharge status: Home / Self Care | Payer: Medicaid Other | Attending: Emergency Medicine | Admitting: Emergency Medicine

## 2018-05-14 DIAGNOSIS — Z79899 Other long term (current) drug therapy: Secondary | ICD-10-CM | POA: Diagnosis not present

## 2018-05-14 DIAGNOSIS — M7918 Myalgia, other site: Secondary | ICD-10-CM | POA: Insufficient documentation

## 2018-05-14 DIAGNOSIS — M25511 Pain in right shoulder: Secondary | ICD-10-CM | POA: Diagnosis present

## 2018-05-14 DIAGNOSIS — M791 Myalgia, unspecified site: Secondary | ICD-10-CM

## 2018-05-14 MED ORDER — KETOROLAC TROMETHAMINE 60 MG/2ML IM SOLN
60.0000 mg | Freq: Once | INTRAMUSCULAR | Status: AC
  Administered 2018-05-14: 60 mg via INTRAMUSCULAR
  Filled 2018-05-14: qty 2

## 2018-05-14 MED ORDER — METHYLPREDNISOLONE SODIUM SUCC 125 MG IJ SOLR
125.0000 mg | Freq: Once | INTRAMUSCULAR | Status: AC
  Administered 2018-05-14: 125 mg via INTRAMUSCULAR
  Filled 2018-05-14: qty 2

## 2018-05-14 NOTE — ED Triage Notes (Signed)
Patient states she has RA and "a muscle disorder." Reports generalized body aches, neck and shoulder tightness, pain in her sides and tingling in her L leg. Onset tonight.
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
## 2018-05-14 NOTE — Discharge Instructions (Addendum)
Return if any problems.  See your Physicain for recheck  

## 2018-06-27 ENCOUNTER — Ambulatory Visit: Payer: Medicaid Other | Admitting: Neurology

## 2018-06-27 ENCOUNTER — Encounter

## 2018-07-10 ENCOUNTER — Ambulatory Visit (INDEPENDENT_AMBULATORY_CARE_PROVIDER_SITE_OTHER): Payer: Medicaid Other | Admitting: Obstetrics & Gynecology

## 2018-07-10 ENCOUNTER — Other Ambulatory Visit: Payer: Self-pay

## 2018-07-10 ENCOUNTER — Encounter: Payer: Self-pay | Admitting: Obstetrics & Gynecology

## 2018-07-10 VITALS — BP 111/68 | HR 71 | Ht 67.0 in | Wt 184.0 lb

## 2018-07-10 DIAGNOSIS — N750 Cyst of Bartholin's gland: Secondary | ICD-10-CM | POA: Diagnosis not present
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
## 2018-07-11 ENCOUNTER — Other Ambulatory Visit: Payer: Self-pay | Admitting: Obstetrics & Gynecology

## 2018-07-11 ENCOUNTER — Encounter (HOSPITAL_COMMUNITY): Payer: Self-pay

## 2018-07-11 ENCOUNTER — Encounter (HOSPITAL_COMMUNITY)
Admission: RE | Admit: 2018-07-11 | Discharge: 2018-07-11 | Disposition: A | Discharge status: Home / Self Care | Payer: Medicaid Other | Source: Ambulatory Visit | Attending: Obstetrics & Gynecology | Admitting: Obstetrics & Gynecology

## 2018-07-11 ENCOUNTER — Telehealth: Payer: Self-pay | Admitting: Obstetrics & Gynecology

## 2018-07-11 DIAGNOSIS — Z01812 Encounter for preprocedural laboratory examination: Secondary | ICD-10-CM | POA: Insufficient documentation

## 2018-07-11 LAB — CBC
HCT: 34.7 % — ABNORMAL LOW (ref 36.0–46.0)
Hemoglobin: 11.1 g/dL — ABNORMAL LOW (ref 12.0–15.0)
MCH: 28.4 pg (ref 26.0–34.0)
MCHC: 32 g/dL (ref 30.0–36.0)
MCV: 88.7 fL (ref 80.0–100.0)
NRBC: 0 % (ref 0.0–0.2)
Platelets: 245 10*3/uL (ref 150–400)
RBC: 3.91 MIL/uL (ref 3.87–5.11)
RDW: 13.9 % (ref 11.5–15.5)
WBC: 4.3 10*3/uL (ref 4.0–10.5)

## 2018-07-11 LAB — URINALYSIS, ROUTINE W REFLEX MICROSCOPIC
Bilirubin Urine: NEGATIVE
GLUCOSE, UA: NEGATIVE mg/dL
Hgb urine dipstick: NEGATIVE
KETONES UR: NEGATIVE mg/dL
Nitrite: NEGATIVE
PH: 5 (ref 5.0–8.0)
Protein, ur: 30 mg/dL — AB
SPECIFIC GRAVITY, URINE: 1.021 (ref 1.005–1.030)

## 2018-07-11 LAB — COMPREHENSIVE METABOLIC PANEL
ALBUMIN: 3.7 g/dL (ref 3.5–5.0)
ALT: 11 U/L (ref 0–44)
AST: 16 U/L (ref 15–41)
Alkaline Phosphatase: 41 U/L (ref 38–126)
Anion gap: 8 (ref 5–15)
BILIRUBIN TOTAL: 0.5 mg/dL (ref 0.3–1.2)
BUN: 8 mg/dL (ref 6–20)
CO2: 24 mmol/L (ref 22–32)
CREATININE: 0.65 mg/dL (ref 0.44–1.00)
Calcium: 8.8 mg/dL — ABNORMAL LOW (ref 8.9–10.3)
Chloride: 103 mmol/L (ref 98–111)
GFR calc Af Amer: >60 mL/min (ref >60)
Glucose, Bld: 112 mg/dL — ABNORMAL HIGH (ref 70–99)
Potassium: 3.7 mmol/L (ref 3.5–5.1)
Sodium: 135 mmol/L (ref 135–145)
Total Protein: 7.3 g/dL (ref 6.5–8.1)

## 2018-07-11 LAB — RAPID HIV SCREEN (HIV 1/2 AB+AG)
HIV 1/2 Antibodies: NONREACTIVE
HIV-1 P24 Antigen - HIV24: NONREACTIVE

## 2018-07-11 LAB — HCG, QUANTITATIVE, PREGNANCY

## 2018-07-11 LAB — TYPE AND SCREEN
ABO/RH(D): O POS
Antibody Screen: NEGATIVE

## 2018-07-11 NOTE — Telephone Encounter (Signed)
Spoke with pt. Pt has read that after surgery, she may have pain during orgasms and having dryness after healed. Pt scheduled for surgery 07/16/2018. Please advise. Thanks!! JSY

## 2018-07-11 NOTE — Telephone Encounter (Signed)
Patient called, has questions about upcoming surgery.  [phone]

## 2018-07-11 NOTE — Telephone Encounter (Signed)
No she won't have either

## 2018-07-11 NOTE — Patient Instructions (Signed)
Your procedure is scheduled on:07/16/2018   Report to [name] at   10:45  AM.  Call this number if you have problems the morning of surgery: [phone]   Remember:   Do not drink or eat food:After Midnight.  :  Take these medicines the morning of surgery with A SIP OF WATER: Cymbalta, tramadol and Zofran if needed   Do not wear jewelry, make-up or nail polish.  Do not wear lotions, powders, or perfumes. You may wear deodorant.  Do not shave 48 hours prior to surgery. Men may shave face and neck.  Do not bring valuables to the hospital.  Contacts, dentures or bridgework may not be worn into surgery.  Leave suitcase in the car. After surgery it may be brought to your room.  For patients admitted to the hospital, checkout time is 11:00 AM the day of discharge.   Patients discharged the day of surgery will not be allowed to drive home.    Special Instructions: Shower using CHG night before surgery and shower the day of surgery use CHG.  Use special wash - you have one bottle of CHG for all showers.  You should use approximately 1/2 of the bottle for each shower.  Bartholin's Cyst or Abscess  A Bartholin's cyst is a fluid-filled sac that forms on a Bartholin's gland. Bartholin's glands are small glands in the folds of skin around the opening of the vagina (labia). This type of cyst causes a bulge or lump near the lower opening of the vagina. If you have a cyst that is small and not infected, you may be able to take care of it at home. If your cyst gets infected, it may cause pain and your doctor may need to drain it. An infected Bartholin's cyst is called a Bartholin's abscess. Follow these instructions at home: Medicines  Take over-the-counter and prescription medicines only as told by your doctor.  If you were prescribed an antibiotic medicine, take it as told by your doctor. Do not stop taking the antibiotic even if you start to feel better. Managing pain and swelling  Try sitz  baths to help with pain and swelling. A sitz bath is a warm water bath in which the water only comes up to your hips and should cover your buttocks. You may take sitz baths a few times a day.  Put heat on the affected area as often as needed. Use the heat source that your doctor recommends, such as a moist heat pack or a heating pad. ? Place a towel between your skin and the heat source. ? Leave the heat on for 20-30 minutes. ? Remove the heat if your skin turns bright red. This is especially important if you cannot feel pain, heat, or cold. You may have a greater risk of getting burned. General instructions  If your cyst or abscess was drained: ? Follow instructions from your doctor about how to take care of your wound. ? Use feminine pads to absorb any fluid.  Do not push on or squeeze your cyst.  Do not have sex until the cyst has gone away or your wound from drainage has healed.  Take these steps to help prevent a Bartholin's cyst from returning, and to prevent other Bartholin's cysts from forming: ? Take a bath or shower once a day. Clean your vaginal area with mild soap and water when you bathe. ? Practice safe sex to prevent STIs (sexually transmitted infections). Talk with your doctor about how  to prevent STIs and which forms of birth control (contraception) may be best for you.  Keep all follow-up visits as told by your doctor. This is important. Contact a doctor if:  You have a fever.  You get redness, swelling, or pain around your cyst.  You have fluid, blood, pus, or a bad smell coming from your cyst.  You have a cyst that gets larger or comes back. Summary  A Bartholin's cyst is a fluid-filled sac that forms on a Bartholin's gland. These small glands are found in the folds of skin around the opening of the vagina (labia).  This type of cyst causes a bulge or lump near the lower opening of the vagina. An infected Bartholin's cyst is called a Bartholin's abscess.  Try  sitz baths a few times a day to help with pain and swelling.  Do not push on or squeeze your cyst. This information is not intended to replace advice given to you by your health care provider. Make sure you discuss any questions you have with your health care provider. Document Released: 09/07/2008 Document Revised: 03/13/2017 Document Reviewed: 03/13/2017 Elsevier Interactive Patient Education  2019 [employer]   General Anesthesia, Adult, Care After This sheet gives you information about how to care for yourself after your procedure. Your health care provider may also give you more specific instructions. If you have problems or questions, contact your health care provider. What can I expect after the procedure? After the procedure, the following side effects are common:  Pain or discomfort at the IV site.  Nausea.  Vomiting.  Sore throat.  Trouble concentrating.  Feeling cold or chills.  Weak or tired.  Sleepiness and fatigue.  Soreness and body aches. These side effects can affect parts of the body that were not involved in surgery. Follow these instructions at home:  For at least 24 hours after the procedure:  Have a responsible adult stay with you. It is important to have someone help care for you until you are awake and alert.  Rest as needed.  Do not: ? Participate in activities in which you could fall or become injured. ? Drive. ? Use heavy machinery. ? Drink alcohol. ? Take sleeping pills or medicines that cause drowsiness. ? Make important decisions or sign legal documents. ? Take care of children on your own. Eating and drinking  Follow any instructions from your health care provider about eating or drinking restrictions.  When you feel hungry, start by eating small amounts of foods that are soft and easy to digest (bland), such as toast. Gradually return to your regular diet.  Drink enough fluid to keep your urine pale yellow.  If you vomit,  rehydrate by drinking water, juice, or clear broth. General instructions  If you have sleep apnea, surgery and certain medicines can increase your risk for breathing problems. Follow instructions from your health care provider about wearing your sleep device: ? Anytime you are sleeping, including during daytime naps. ? While taking prescription pain medicines, sleeping medicines, or medicines that make you drowsy.  Return to your normal activities as told by your health care provider. Ask your health care provider what activities are safe for you.  Take over-the-counter and prescription medicines only as told by your health care provider.  If you smoke, do not smoke without supervision.  Keep all follow-up visits as told by your health care provider. This is important. Contact a health care provider if:  You have nausea or vomiting that  does not get better with medicine.  You cannot eat or drink without vomiting.  You have pain that does not get better with medicine.  You are unable to pass urine.  You develop a skin rash.  You have a fever.  You have redness around your IV site that gets worse. Get help right away if:  You have difficulty breathing.  You have chest pain.  You have blood in your urine or stool, or you vomit blood. Summary  After the procedure, it is common to have a sore throat or nausea. It is also common to feel tired.  Have a responsible adult stay with you for the first 24 hours after general anesthesia. It is important to have someone help care for you until you are awake and alert.  When you feel hungry, start by eating small amounts of foods that are soft and easy to digest (bland), such as toast. Gradually return to your regular diet.  Drink enough fluid to keep your urine pale yellow.  Return to your normal activities as told by your health care provider. Ask your health care provider what activities are safe for you. This information is not  intended to replace advice given to you by your health care provider. Make sure you discuss any questions you have with your health care provider. Document Released: 09/17/2000 Document Revised: 01/25/2017 Document Reviewed: 01/25/2017 Elsevier Interactive Patient Education  2019 [employer].

## 2018-07-14 NOTE — Telephone Encounter (Signed)
Pt aware. JSY 

## 2018-07-16 ENCOUNTER — Encounter (HOSPITAL_COMMUNITY): Payer: Self-pay | Admitting: *Deleted

## 2018-07-16 ENCOUNTER — Ambulatory Visit (HOSPITAL_COMMUNITY): Payer: Medicaid Other | Admitting: Anesthesiology

## 2018-07-16 ENCOUNTER — Encounter (HOSPITAL_COMMUNITY)
Admission: RE | Disposition: A | Discharge status: Home / Self Care | Payer: Self-pay | Source: Home / Self Care | Attending: Obstetrics & Gynecology

## 2018-07-16 ENCOUNTER — Ambulatory Visit (HOSPITAL_COMMUNITY)
Admission: RE | Admit: 2018-07-16 | Discharge: 2018-07-16 | Disposition: A | Discharge status: Home / Self Care | Payer: Medicaid Other | Attending: Obstetrics & Gynecology | Admitting: Obstetrics & Gynecology

## 2018-07-16 ENCOUNTER — Other Ambulatory Visit: Payer: Self-pay

## 2018-07-16 DIAGNOSIS — Z791 Long term (current) use of non-steroidal anti-inflammatories (NSAID): Secondary | ICD-10-CM | POA: Insufficient documentation

## 2018-07-16 DIAGNOSIS — M5412 Radiculopathy, cervical region: Secondary | ICD-10-CM | POA: Diagnosis not present

## 2018-07-16 DIAGNOSIS — M25511 Pain in right shoulder: Secondary | ICD-10-CM | POA: Diagnosis not present

## 2018-07-16 DIAGNOSIS — F909 Attention-deficit hyperactivity disorder, unspecified type: Secondary | ICD-10-CM | POA: Insufficient documentation

## 2018-07-16 DIAGNOSIS — M1990 Unspecified osteoarthritis, unspecified site: Secondary | ICD-10-CM | POA: Diagnosis not present

## 2018-07-16 DIAGNOSIS — F329 Major depressive disorder, single episode, unspecified: Secondary | ICD-10-CM | POA: Insufficient documentation

## 2018-07-16 DIAGNOSIS — Z79899 Other long term (current) drug therapy: Secondary | ICD-10-CM | POA: Insufficient documentation

## 2018-07-16 DIAGNOSIS — G8929 Other chronic pain: Secondary | ICD-10-CM | POA: Diagnosis not present

## 2018-07-16 DIAGNOSIS — Z8249 Family history of ischemic heart disease and other diseases of the circulatory system: Secondary | ICD-10-CM | POA: Diagnosis not present

## 2018-07-16 DIAGNOSIS — Z833 Family history of diabetes mellitus: Secondary | ICD-10-CM | POA: Insufficient documentation

## 2018-07-16 DIAGNOSIS — M797 Fibromyalgia: Secondary | ICD-10-CM | POA: Diagnosis not present

## 2018-07-16 DIAGNOSIS — Z87448 Personal history of other diseases of urinary system: Secondary | ICD-10-CM

## 2018-07-16 DIAGNOSIS — N750 Cyst of Bartholin's gland: Secondary | ICD-10-CM

## 2018-07-16 DIAGNOSIS — Z818 Family history of other mental and behavioral disorders: Secondary | ICD-10-CM | POA: Insufficient documentation

## 2018-07-16 DIAGNOSIS — Z881 Allergy status to other antibiotic agents status: Secondary | ICD-10-CM | POA: Insufficient documentation

## 2018-07-16 HISTORY — PX: BARTHOLIN GLAND CYST EXCISION: SHX565

## 2018-07-16 SURGERY — EXCISION, BARTHOLIN'S GLAND
Anesthesia: General | Laterality: Right

## 2018-07-16 MED ORDER — BUPIVACAINE LIPOSOME 1.3 % IJ SUSP
INTRAMUSCULAR | Status: DC | PRN
  Administered 2018-07-16: 20 mL

## 2018-07-16 MED ORDER — FENTANYL CITRATE (PF) 100 MCG/2ML IJ SOLN
INTRAMUSCULAR | Status: DC | PRN
  Administered 2018-07-16 (×2): 50 ug via INTRAVENOUS
  Administered 2018-07-16: 25 ug via INTRAVENOUS
  Administered 2018-07-16: 50 ug via INTRAVENOUS
  Administered 2018-07-16: 25 ug via INTRAVENOUS

## 2018-07-16 MED ORDER — PROPOFOL 10 MG/ML IV BOLUS
INTRAVENOUS | Status: DC | PRN
  Administered 2018-07-16: 50 mg via INTRAVENOUS
  Administered 2018-07-16: 150 mg via INTRAVENOUS

## 2018-07-16 MED ORDER — LACTATED RINGERS IV SOLN: INTRAVENOUS | Status: DC

## 2018-07-16 MED ORDER — CEFAZOLIN SODIUM-DEXTROSE 2-4 GM/100ML-% IV SOLN
INTRAVENOUS | Status: AC
  Filled 2018-07-16: qty 100

## 2018-07-16 MED ORDER — MIDAZOLAM HCL 2 MG/2ML IJ SOLN: 0.5000 mg | Freq: Once | INTRAMUSCULAR | Status: DC | PRN

## 2018-07-16 MED ORDER — KETOROLAC TROMETHAMINE 30 MG/ML IJ SOLN
30.0000 mg | Freq: Once | INTRAMUSCULAR | Status: AC
  Administered 2018-07-16: 30 mg via INTRAVENOUS

## 2018-07-16 MED ORDER — BUPIVACAINE LIPOSOME 1.3 % IJ SUSP
20.0000 mL | Freq: Once | INTRAMUSCULAR | Status: DC
  Filled 2018-07-16: qty 20

## 2018-07-16 MED ORDER — HYDROMORPHONE HCL 1 MG/ML IJ SOLN
0.2500 mg | INTRAMUSCULAR | Status: DC | PRN
  Administered 2018-07-16 (×2): 0.5 mg via INTRAVENOUS
  Filled 2018-07-16 (×2): qty 0.5

## 2018-07-16 MED ORDER — MIDAZOLAM HCL 2 MG/2ML IJ SOLN
INTRAMUSCULAR | Status: AC
  Filled 2018-07-16: qty 2

## 2018-07-16 MED ORDER — DEXAMETHASONE SODIUM PHOSPHATE 4 MG/ML IJ SOLN
INTRAMUSCULAR | Status: DC | PRN
  Administered 2018-07-16: 10 mg via INTRAVENOUS

## 2018-07-16 MED ORDER — MIDAZOLAM HCL 5 MG/5ML IJ SOLN
INTRAMUSCULAR | Status: DC | PRN
  Administered 2018-07-16: 2 mg via INTRAVENOUS

## 2018-07-16 MED ORDER — BUPIVACAINE-EPINEPHRINE (PF) 0.25% -1:200000 IJ SOLN
INTRAMUSCULAR | Status: AC
  Filled 2018-07-16: qty 30

## 2018-07-16 MED ORDER — LIDOCAINE 2% (20 MG/ML) 5 ML SYRINGE
INTRAMUSCULAR | Status: DC | PRN
  Administered 2018-07-16: 40 mg via INTRAVENOUS

## 2018-07-16 MED ORDER — BUPIVACAINE LIPOSOME 1.3 % IJ SUSP
INTRAMUSCULAR | Status: AC
  Filled 2018-07-16: qty 20

## 2018-07-16 MED ORDER — ONDANSETRON HCL 4 MG/2ML IJ SOLN
INTRAMUSCULAR | Status: AC
  Filled 2018-07-16: qty 2

## 2018-07-16 MED ORDER — DEXAMETHASONE SODIUM PHOSPHATE 10 MG/ML IJ SOLN
INTRAMUSCULAR | Status: AC
  Filled 2018-07-16: qty 1

## 2018-07-16 MED ORDER — FENTANYL CITRATE (PF) 250 MCG/5ML IJ SOLN
INTRAMUSCULAR | Status: AC
  Filled 2018-07-16: qty 5

## 2018-07-16 MED ORDER — LACTATED RINGERS IV SOLN
INTRAVENOUS | Status: DC | PRN
  Administered 2018-07-16 (×2): via INTRAVENOUS

## 2018-07-16 MED ORDER — CEFAZOLIN SODIUM-DEXTROSE 2-4 GM/100ML-% IV SOLN
2.0000 g | INTRAVENOUS | Status: AC
  Administered 2018-07-16: 2 g via INTRAVENOUS

## 2018-07-16 MED ORDER — ONDANSETRON HCL 4 MG/2ML IJ SOLN
INTRAMUSCULAR | Status: DC | PRN
  Administered 2018-07-16: 4 mg via INTRAVENOUS

## 2018-07-16 MED ORDER — HYDROCODONE-ACETAMINOPHEN 7.5-325 MG PO TABS
1.0000 | ORAL_TABLET | Freq: Once | ORAL | Status: AC | PRN
  Administered 2018-07-16: 1 via ORAL
  Filled 2018-07-16: qty 1

## 2018-07-16 MED ORDER — PROPOFOL 10 MG/ML IV BOLUS
INTRAVENOUS | Status: AC
  Filled 2018-07-16: qty 20

## 2018-07-16 MED ORDER — PROMETHAZINE HCL 25 MG/ML IJ SOLN: 6.2500 mg | INTRAMUSCULAR | Status: DC | PRN

## 2018-07-16 MED ORDER — KETOROLAC TROMETHAMINE 10 MG PO TABS: 10.0000 mg | ORAL_TABLET | Freq: Three times a day (TID) | ORAL | 0 refills | Status: DC | PRN

## 2018-07-16 MED ORDER — BUPIVACAINE-EPINEPHRINE (PF) 0.25% -1:200000 IJ SOLN
INTRAMUSCULAR | Status: DC | PRN
  Administered 2018-07-16: 3 mL

## 2018-07-16 MED ORDER — ARTIFICIAL TEARS OPHTHALMIC OINT
TOPICAL_OINTMENT | OPHTHALMIC | Status: AC
  Filled 2018-07-16: qty 3.5

## 2018-07-16 MED ORDER — KETOROLAC TROMETHAMINE 30 MG/ML IJ SOLN
INTRAMUSCULAR | Status: AC
  Filled 2018-07-16: qty 1

## 2018-07-16 MED ORDER — OXYCODONE-ACETAMINOPHEN 5-325 MG PO TABS: 1.0000 | ORAL_TABLET | ORAL | 0 refills | Status: DC | PRN

## 2018-07-16 MED ORDER — 0.9 % SODIUM CHLORIDE (POUR BTL) OPTIME
TOPICAL | Status: DC | PRN
  Administered 2018-07-16: 1000 mL

## 2018-07-16 MED ORDER — GLYCOPYRROLATE PF 0.2 MG/ML IJ SOSY
PREFILLED_SYRINGE | INTRAMUSCULAR | Status: DC | PRN
  Administered 2018-07-16: .2 mg via INTRAVENOUS

## 2018-07-16 SURGICAL SUPPLY — 25 items
BLADE SURG SZ10 CARB STEEL (BLADE) ×2 IMPLANT
CLOTH BEACON ORANGE TIMEOUT ST (SAFETY) ×3 IMPLANT
COVER LIGHT HANDLE STERIS (MISCELLANEOUS) ×6 IMPLANT
COVER WAND RF STERILE (DRAPES) ×2 IMPLANT
DECANTER SPIKE VIAL GLASS SM (MISCELLANEOUS) ×3 IMPLANT
ELECT REM PT RETURN 9FT ADLT (ELECTROSURGICAL) ×3
ELECTRODE REM PT RTRN 9FT ADLT (ELECTROSURGICAL) ×1 IMPLANT
GAUZE SPONGE 4X4 16PLY XRAY LF (GAUZE/BANDAGES/DRESSINGS) ×2 IMPLANT
GLOVE BIOGEL PI IND STRL 7.0 (GLOVE) ×1 IMPLANT
GLOVE BIOGEL PI IND STRL 8 (GLOVE) ×1 IMPLANT
GLOVE BIOGEL PI INDICATOR 7.0 (GLOVE) ×4
GLOVE BIOGEL PI INDICATOR 8 (GLOVE) ×2
GLOVE ECLIPSE 8.0 STRL XLNG CF (GLOVE) ×3 IMPLANT
GOWN STRL REUS W/TWL LRG LVL3 (GOWN DISPOSABLE) ×4 IMPLANT
GOWN STRL REUS W/TWL XL LVL3 (GOWN DISPOSABLE) ×3 IMPLANT
KIT TURNOVER KIT A (KITS) ×3 IMPLANT
MANIFOLD NEPTUNE II (INSTRUMENTS) ×3 IMPLANT
NEEDLE HYPO 22GX1.5 SAFETY (NEEDLE) ×2 IMPLANT
PACK PERI GYN (CUSTOM PROCEDURE TRAY) ×3 IMPLANT
PAD ARMBOARD 7.5X6 YLW CONV (MISCELLANEOUS) ×3 IMPLANT
SET BASIN LINEN APH (SET/KITS/TRAYS/PACK) ×3 IMPLANT
SUT CHROMIC 3 0 SH 27 (SUTURE) ×3 IMPLANT
SUT VIC AB 3-0 SH 27 (SUTURE) ×3
SUT VIC AB 3-0 SH 27X BRD (SUTURE) ×1 IMPLANT
SUT VICRYL 0 UR6 27IN ABS (SUTURE) ×10 IMPLANT
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
## 2018-07-16 NOTE — Anesthesia Preprocedure Evaluation (Signed)
Anesthesia Evaluation  Patient identified by MRN, date of birth, ID band Patient awake    Reviewed: Allergy & Precautions, NPO status , Patient's Chart, lab work & pertinent test results  Airway Mallampati: I  TM Distance: >3 FB Neck ROM: Full    Dental no notable dental hx. (+) Teeth Intact   Pulmonary neg pulmonary ROS,    Pulmonary exam normal breath sounds clear to auscultation       Cardiovascular Exercise Tolerance: Good negative cardio ROS Normal cardiovascular examI Rhythm:Regular Rate:Normal     Neuro/Psych Depression Pt reports cervical radiculopathy - states may get Steroid injections in the future   Neuromuscular disease negative psych ROS   GI/Hepatic negative GI ROS, Neg liver ROS,   Endo/Other  negative endocrine ROS  Renal/GU negative Renal ROS  negative genitourinary   Musculoskeletal  (+) Arthritis , Fibromyalgia -  Abdominal   Peds negative pediatric ROS (+)  Hematology negative hematology ROS (+)   Anesthesia Other Findings   Reproductive/Obstetrics negative OB ROS                             Anesthesia Physical Anesthesia Plan  ASA: II  Anesthesia Plan: General   Post-op Pain Management:    Induction: Intravenous  PONV Risk Score and Plan:   Airway Management Planned: LMA  Additional Equipment:   Intra-op Plan:   Post-operative Plan:   Informed Consent: I have reviewed the patients History and Physical, chart, labs and discussed the procedure including the risks, benefits and alternatives for the proposed anesthesia with the patient or authorized representative who has indicated his/her understanding and acceptance.     Dental advisory given  Plan Discussed with: CRNA  Anesthesia Plan Comments:         Anesthesia Quick Evaluation

## 2018-07-16 NOTE — Transfer of Care (Signed)
Immediate Anesthesia Transfer of Care Note  Patient: Veronica [name]  Procedure(s) Performed: EXCISION OF RIGHT BARTHOLIN GLAND CYST (Right )  Patient Location: PACU  Anesthesia Type:General  Level of Consciousness: alert , oriented and patient cooperative  Airway & Oxygen Therapy: Patient Spontanous Breathing  Post-op Assessment: Report given to RN and Post -op Vital signs reviewed and stable  Post vital signs: Reviewed and stable  Last Vitals:  Vitals Value Taken Time  BP 114/71 07/16/2018 11:47 AM  Temp    Pulse 63 07/16/2018 11:52 AM  Resp 19 07/16/2018 11:52 AM  SpO2 100 % 07/16/2018 11:52 AM  Vitals shown include unvalidated device data.  Last Pain:  Vitals:   07/16/18 1001  TempSrc: Oral  PainSc: 0-No pain      Patients Stated Pain Goal: 10 (07/16/18 1001)  Complications: No apparent anesthesia complications

## 2018-07-16 NOTE — Anesthesia Procedure Notes (Signed)
Procedure Name: LMA Insertion Date/Time: 07/16/2018 10:38 AM Performed by: Braylon Grenda A, CRNA Pre-anesthesia Checklist: Patient identified, Emergency Drugs available, Suction available, Patient being monitored and Timeout performed Patient Re-evaluated:Patient Re-evaluated prior to induction Oxygen Delivery Method: Circle system utilized Preoxygenation: Pre-oxygenation with 100% oxygen Induction Type: IV induction LMA: LMA inserted LMA Size: 4.0 Number of attempts: 1 Placement Confirmation: positive ETCO2 and breath sounds checked- equal and bilateral Tube secured with: Tape Dental Injury: Teeth and Oropharynx as per pre-operative assessment

## 2018-07-16 NOTE — Anesthesia Postprocedure Evaluation (Signed)
Anesthesia Post Note  Patient: Veronica [name]  Procedure(s) Performed: EXCISION OF RIGHT BARTHOLIN GLAND CYST (Right )  Patient location during evaluation: PACU Anesthesia Type: General Level of consciousness: awake and alert and oriented Pain management: pain level controlled Vital Signs Assessment: post-procedure vital signs reviewed and stable Respiratory status: spontaneous breathing Cardiovascular status: stable Postop Assessment: no apparent nausea or vomiting Anesthetic complications: no     Last Vitals:  Vitals:   07/16/18 1001 07/16/18 1148  BP: (!) 106/52   Pulse: 68   Resp: 20   Temp: 36.8 C (!) 36.3 C  SpO2: 97%     Last Pain:  Vitals:   07/16/18 1148  TempSrc:   PainSc: Asleep                 Ninah Moccio A

## 2018-07-16 NOTE — Op Note (Signed)
Preoperative diagnosis: Recurrent Bartholin's gland cyst, right                                         History of recurrent Bartholin's gland abscess  Postop diagnosis: Same as above  Procedure: Excision of Bartholin gland cyst  Surgeon: Lauretta Chester, MD   Anesthesia: Regional mask airway  Findings: Patient has had a problematic right Bartholin's gland for some time now.  Is been infected at times and currently is a noninfected cyst.  Patient desired to have a removed at this nonacute time.  In the OR she had approximately a 2-1/2 x 2-1/2 cm right Bartholin gland cyst did not appear to be an abscess or infected at this point  Description of operation: Patient was taken to the operating room where she was placed in the supine position and underwent laryngeal mask airway anesthesia She was placed in lithotomy position and prepped and draped in usual sterile fashion The Bartholin's gland on the right was identified and found to be enlarged per preoperative evaluation in the office Quarter percent Marcaine with 1 or 200,000 of epinephrine was injected in the area of the right Bartholin's gland and incision was made Blunt dissection was then performed to remove the Bartholin gland cyst in total Was sent to pathology for evaluation Fossa of the Bartholin gland cyst was closed in 4 layers Each layer was closed using interrupted figure-of-eight 0 Vicryl sutures There was good resultant hemostasis and return of anatomy The medial mucosa was then closed to the lateral vulva good anatomical repair and good hemostasis 20 cc of Exparel was injected into the surgical site for postoperative pain management Loss for the procedure was 200 cc and was hemostatic at the end of the case Additionally I did a vaginal exam and there was no blood Shella Spearing extending into the perivaginal tissue Rectal exam also confirmed there was no bleeding into the perirectal space  Patient tolerated the procedure well she was  taken covering good stable condition all counts correct x3 She received Ancef and Toradol preoperatively prophylactically  The patient will be followed up in the office next week  Lazaro Arms, MD 07/16/2018 11:37 AM

## 2018-07-16 NOTE — Discharge Instructions (Signed)
Bartholin's Cyst or Abscess    A Bartholin's cyst is a fluid-filled sac that forms on a Bartholin's gland. Bartholin's glands are small glands in the folds of skin around the vaginal opening (labia). These glands produce a fluid to moisten (lubricate) the outside of the vagina during sex.  A cyst that is not large or infected may not cause any problems or require treatment. If the cyst gets infected, it is called a Bartholin's abscess. An abscess may cause symptoms such as pain and swelling and is more likely to require treatment.  What are the causes?  This condition may be caused by a blocked Bartholin's gland. These glands can become blocked due to natural buildup of fluid and oils. Bacteria inside of the cyst can cause infection.  In many cases, the cause is not known.  What increases the risk?  You may be at increased risk of developing a Bartholin's cyst or abscess if:  · You are of childbearing age.  · You have a history of Bartholin's cysts or abscesses.  · You have diabetes.  · You have an STI (sexually transmitted infection).  What are the signs or symptoms?  Symptoms may include:  · A bulge or lump on the labia, near the lower opening of the vagina.  · Discomfort or pain. This may get worse during sex or when walking.  · Redness, swelling, or fluid draining from the area. These may be signs of an abscess.  How severe your symptoms are depends on the size of your cyst and whether it is infected. Infection causes symptoms to get more severe.  How is this diagnosed?  This condition may be diagnosed based on:  · Your symptoms and medical history.  · A physical exam to check for swelling in your vaginal area. You may lie on your back on an exam table and have your feet placed into footrests for the exam.  · Blood tests to check for infections.  · Removal of a fluid sample from the cyst or abscess (biopsy) for testing.  You may work with a health care provider who specializes in women's health (gynecologist)  for diagnosis and treatment.  How is this treated?  If your cyst is small, not infected, and not causing symptoms, you may not need any treatment. These cysts often go away on their own, with home care such as hot baths or warm compresses.  If you have a large cyst or an abscess, treatment may include:  · Antibiotic medicine.  · A procedure to drain the fluid inside the cyst or abscess. These procedures involve making an incision in the cyst or abscess so that the fluid drains out, and then one of the following may be done:  ? A small, thin tube (catheter) may be placed inside the cyst or abscess so that it does not close and fill up with fluid again (fistulization). The catheter will be removed at a follow-up visit.  ? The edges of the incision may be stitched to your skin so that the cyst or abscess stays open (marsupialization). This allows it to continue to drain and not fill up with fluid again.  If you have cysts or abscesses that keep returning (recurring) and have required incision and drainage multiple times, your health care provider may talk with you about surgery to remove the Bartholin's gland.  Follow these instructions at home:  Medicines  · Take over-the-counter and prescription medicines only as told by your health care provider.  ·   If you were prescribed an antibiotic medicine, take it as told by your health care provider. Do not stop taking the antibiotic even if your condition improves.  Managing pain and swelling  · Try sitz baths to help with pain and swelling. A sitz bath is a warm water bath in which the water only comes up to your hips and should cover your buttocks. You may take sitz baths several times a day.  · Apply heat to the affected area as often as needed. Use the heat source that your health care provider recommends, such as a moist heat pack or a heating pad.  ? Place a towel between your skin and the heat source.  ? Leave the heat on for 20-30 minutes.  ? Remove the heat if your  skin turns bright red. This is especially important if you are unable to feel pain, heat, or cold. You may have a greater risk of getting burned.  General instructions  · If your cyst or abscess was drained, follow instructions from your health care provider about how to take care of your wound. Use feminine pads as needed to absorb any drainage.  · Do not push on or squeeze your cyst.  · Do not have sex until the cyst has gone away or your wound from drainage has healed.  · Take these steps to help prevent a Bartholin's cyst from returning, and to prevent other Bartholin's cysts from developing:  ? Take a bath or shower once a day. Clean your vaginal area with mild soap and water when you bathe.  ? Practice safe sex to prevent STIs. Talk with your health care provider about how to prevent STIs and which forms of birth control (contraception) may be best for you.  · Keep all follow-up visits as told by your health care provider. This is important.  Contact a health care provider if:  · You have a fever.  · You develop redness, swelling, or pain around your cyst.  · You have fluid, blood, pus, or a bad smell coming from your cyst.  · You have a cyst that gets larger or comes back.  Summary  · A Bartholin's cyst is a fluid-filled sac that forms on a Bartholin's gland. These glands are in the folds of skin around the vaginal opening (labia).  · If your cyst is small, not infected, and not causing symptoms, you may not need any treatment. These cysts often go away on their own, with home care such as hot baths or warm compresses.  · If you have a large cyst or an abscess, your health care provider may perform a procedure to drain the fluid.  · If you have cysts or abscesses that keep returning (recurring) and have required incision and drainage multiple times, your health care provider may talk with you about surgery to remove the Bartholin's gland.  This information is not intended to replace advice given to you by  your health care provider. Make sure you discuss any questions you have with your health care provider.  Document Released: 06/11/2005 Document Revised: 03/13/2017 Document Reviewed: 03/13/2017  Elsevier Interactive Patient Education © 2019 [employer]

## 2018-07-17 ENCOUNTER — Encounter (HOSPITAL_COMMUNITY): Payer: Self-pay | Admitting: Obstetrics & Gynecology

## 2018-07-20 ENCOUNTER — Other Ambulatory Visit: Payer: Self-pay

## 2018-07-20 ENCOUNTER — Emergency Department (HOSPITAL_COMMUNITY)
Admission: EM | Admit: 2018-07-20 | Discharge: 2018-07-21 | Disposition: A | Discharge status: Home / Self Care | Payer: Medicaid Other | Attending: Emergency Medicine | Admitting: Emergency Medicine

## 2018-07-20 ENCOUNTER — Emergency Department (HOSPITAL_COMMUNITY): Payer: Medicaid Other

## 2018-07-20 ENCOUNTER — Encounter (HOSPITAL_COMMUNITY): Payer: Self-pay | Admitting: Emergency Medicine

## 2018-07-20 DIAGNOSIS — Z79899 Other long term (current) drug therapy: Secondary | ICD-10-CM | POA: Insufficient documentation

## 2018-07-20 DIAGNOSIS — Z5189 Encounter for other specified aftercare: Secondary | ICD-10-CM

## 2018-07-20 DIAGNOSIS — N750 Cyst of Bartholin's gland: Secondary | ICD-10-CM | POA: Insufficient documentation

## 2018-07-20 DIAGNOSIS — R102 Pelvic and perineal pain: Secondary | ICD-10-CM | POA: Diagnosis present

## 2018-07-20 LAB — URINALYSIS, ROUTINE W REFLEX MICROSCOPIC
BACTERIA UA: NONE SEEN
Bilirubin Urine: NEGATIVE
Glucose, UA: NEGATIVE mg/dL
Ketones, ur: NEGATIVE mg/dL
Nitrite: NEGATIVE
Protein, ur: 100 mg/dL — AB
RBC / HPF: >50 RBC/hpf — ABNORMAL HIGH (ref 0–5)
Specific Gravity, Urine: 1.027 (ref 1.005–1.030)
pH: 6 (ref 5.0–8.0)

## 2018-07-20 LAB — CBC WITH DIFFERENTIAL/PLATELET
Abs Immature Granulocytes: 0.02 10*3/uL (ref 0.00–0.07)
BASOS PCT: 0 %
Basophils Absolute: 0 10*3/uL (ref 0.0–0.1)
Eosinophils Absolute: 0.1 10*3/uL (ref 0.0–0.5)
Eosinophils Relative: 1 %
HCT: 30.5 % — ABNORMAL LOW (ref 36.0–46.0)
Hemoglobin: 9.6 g/dL — ABNORMAL LOW (ref 12.0–15.0)
Immature Granulocytes: 0 %
Lymphocytes Relative: 28 %
Lymphs Abs: 2.2 10*3/uL (ref 0.7–4.0)
MCH: 28.3 pg (ref 26.0–34.0)
MCHC: 31.5 g/dL (ref 30.0–36.0)
MCV: 90 fL (ref 80.0–100.0)
Monocytes Absolute: 0.7 10*3/uL (ref 0.1–1.0)
Monocytes Relative: 9 %
Neutro Abs: 4.8 10*3/uL (ref 1.7–7.7)
Neutrophils Relative %: 62 %
Platelets: 249 10*3/uL (ref 150–400)
RBC: 3.39 MIL/uL — ABNORMAL LOW (ref 3.87–5.11)
RDW: 14.3 % (ref 11.5–15.5)
WBC: 7.8 10*3/uL (ref 4.0–10.5)
nRBC: 0 % (ref 0.0–0.2)

## 2018-07-20 LAB — BASIC METABOLIC PANEL
Anion gap: 7 (ref 5–15)
BUN: 14 mg/dL (ref 6–20)
CHLORIDE: 104 mmol/L (ref 98–111)
CO2: 26 mmol/L (ref 22–32)
Calcium: 8.7 mg/dL — ABNORMAL LOW (ref 8.9–10.3)
Creatinine, Ser: 0.69 mg/dL (ref 0.44–1.00)
GFR calc Af Amer: >60 mL/min (ref >60)
GFR calc non Af Amer: >60 mL/min (ref >60)
Glucose, Bld: 99 mg/dL (ref 70–99)
Potassium: 3.9 mmol/L (ref 3.5–5.1)
Sodium: 137 mmol/L (ref 135–145)

## 2018-07-20 LAB — PREGNANCY, URINE: Preg Test, Ur: NEGATIVE

## 2018-07-20 MED ORDER — IOPAMIDOL (ISOVUE-300) INJECTION 61%
100.0000 mL | Freq: Once | INTRAVENOUS | Status: AC | PRN
  Administered 2018-07-20: 100 mL via INTRAVENOUS

## 2018-07-20 MED ORDER — ONDANSETRON HCL 4 MG/2ML IJ SOLN
4.0000 mg | Freq: Once | INTRAMUSCULAR | Status: AC
  Administered 2018-07-20: 4 mg via INTRAVENOUS
  Filled 2018-07-20: qty 2

## 2018-07-20 MED ORDER — HYDROMORPHONE HCL 1 MG/ML IJ SOLN
1.0000 mg | Freq: Once | INTRAMUSCULAR | Status: AC
  Administered 2018-07-20: 1 mg via INTRAVENOUS
  Filled 2018-07-20: qty 1

## 2018-07-20 MED ORDER — SODIUM CHLORIDE 0.9 % IV BOLUS
1000.0000 mL | Freq: Once | INTRAVENOUS | Status: AC
  Administered 2018-07-20: 1000 mL via INTRAVENOUS

## 2018-07-20 MED ORDER — SODIUM CHLORIDE 0.9 % IV SOLN
INTRAVENOUS | Status: DC
  Administered 2018-07-20: 22:00:00 via INTRAVENOUS

## 2018-07-20 MED ORDER — HYDROCODONE-ACETAMINOPHEN 5-325 MG PO TABS: 2.0000 | ORAL_TABLET | ORAL | 0 refills | Status: DC | PRN

## 2018-07-20 NOTE — ED Notes (Signed)
Wound examined by EDP with RN ant NT at bedside

## 2018-07-20 NOTE — ED Triage Notes (Signed)
Had Barth.Cyst removal on Weds in short stay  Given oxycodone and toradol for pain  Out of meds here for pain

## 2018-07-20 NOTE — ED Notes (Signed)
Pt is asleep. edp notified.

## 2018-07-20 NOTE — ED Notes (Signed)
Pt reports that she has run out of pain medication and pain is going down back of right thigh
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
## 2018-07-20 NOTE — ED Notes (Addendum)
Pt reports that she has been unable to have a BM since sx

## 2018-07-20 NOTE — Discharge Instructions (Addendum)
CT scan of the abdomen pelvis shows no evidence of abscess or any significant fluid collection.  Take the hydrocodone as directed for pain control.  Call [facility] in the morning for follow-up with Dr. urine rechecked.

## 2018-07-20 NOTE — ED Notes (Signed)
Pt has history of chronic pain

## 2018-07-21 MED FILL — Hydrocodone-Acetaminophen Tab 5-325 MG: ORAL | Qty: 6 | Status: AC

## 2018-07-25 ENCOUNTER — Other Ambulatory Visit: Payer: Self-pay

## 2018-07-25 ENCOUNTER — Ambulatory Visit: Payer: Medicaid Other | Admitting: Obstetrics & Gynecology

## 2018-07-25 ENCOUNTER — Encounter: Payer: Self-pay | Admitting: Obstetrics & Gynecology

## 2018-07-25 VITALS — BP 111/73 | HR 71 | Ht 67.0 in | Wt 181.0 lb

## 2018-07-25 DIAGNOSIS — Z9889 Other specified postprocedural states: Secondary | ICD-10-CM

## 2018-07-25 MED ORDER — OXYCODONE-ACETAMINOPHEN 5-325 MG PO TABS: 1.0000 | ORAL_TABLET | ORAL | 0 refills | Status: DC | PRN

## 2018-07-25 NOTE — Progress Notes (Signed)
  HPI: Patient returns for routine postoperative follow-up having undergone excision of right Bartholin's gland cyst on 07/16/18.  The patient's immediate postoperative recovery has been unremarkable. Since hospital discharge the patient reports had increased post op pain and ED visit with CT scan was normal.   Current Outpatient Medications: baclofen (LIORESAL) 10 MG tablet, Take 1 tablet by mouth every 8 (eight) hours as needed for muscle spasms. , Disp: , Rfl: 1 DULoxetine (CYMBALTA) 60 MG capsule, Take 60 mg by mouth daily. , Disp: , Rfl: 5 gabapentin (NEURONTIN) 600 MG tablet, Take 1 tablet (600 mg total) by mouth 3 (three) times daily. (Patient taking differently: Take 800 mg by mouth 3 (three) times daily. ), Disp: 90 tablet, Rfl: 4 hydrOXYzine (ATARAX/VISTARIL) 50 MG tablet, Take 50 mg by mouth at bedtime. , Disp: , Rfl: 5 meloxicam (MOBIC) 15 MG tablet, Take 1 tablet (15 mg total) by mouth daily., Disp: 30 tablet, Rfl: 2 traMADol (ULTRAM) 50 MG tablet, Take 50-100 mg by mouth 2 (two) times daily as needed for moderate pain. , Disp: , Rfl:   No current facility-administered medications for this visit.     Blood pressure 111/73, pulse 71, height 5\' 7"  (1.702 m), weight 181 lb (82.1 kg), last menstrual period 06/26/2018.  Physical Exam: Healing right vulva soft tissue swelling but no evidnce of hematoma or infection is noted  Diagnostic Tests:   Pathology: benign  Impression: S/p Right Bartholin's gland removal  Plan: No sex Meds ordered this encounter  Medications  . oxyCODONE-acetaminophen (PERCOCET) 5-325 MG tablet    Sig: Take 1-2 tablets by mouth every 4 (four) hours as needed for severe pain.    Dispense:  24 tablet    Refill:  0     Follow up: 6  weeks  Lazaro Arms, MD

## 2018-09-05 ENCOUNTER — Encounter: Payer: Self-pay | Admitting: Obstetrics & Gynecology

## 2018-09-09 ENCOUNTER — Encounter: Payer: Medicaid Other | Admitting: Obstetrics & Gynecology

## 2018-09-12 ENCOUNTER — Encounter: Payer: Self-pay | Admitting: Obstetrics & Gynecology

## 2018-09-12 ENCOUNTER — Other Ambulatory Visit: Payer: Self-pay

## 2018-09-12 ENCOUNTER — Ambulatory Visit (INDEPENDENT_AMBULATORY_CARE_PROVIDER_SITE_OTHER): Payer: Medicaid Other | Admitting: Obstetrics & Gynecology

## 2018-09-12 VITALS — BP 110/63 | HR 66 | Ht 66.0 in | Wt 179.4 lb

## 2018-09-12 DIAGNOSIS — Z9889 Other specified postprocedural states: Secondary | ICD-10-CM

## 2018-09-12 NOTE — Progress Notes (Signed)
  HPI: Patient returns for routine postoperative follow-up having undergone s/p Bartholin's gland removal on 07/16/2018.  The patient's immediate postoperative recovery has been unremarkable. Since hospital discharge the patient reports no problems doing well.   Current Outpatient Medications: baclofen (LIORESAL) 10 MG tablet, Take 1 tablet by mouth every 8 (eight) hours as needed for muscle spasms. , Disp: , Rfl: 1 DULoxetine (CYMBALTA) 60 MG capsule, Take 60 mg by mouth daily. , Disp: , Rfl: 5 gabapentin (NEURONTIN) 600 MG tablet, Take 1 tablet (600 mg total) by mouth 3 (three) times daily. (Patient taking differently: Take 800 mg by mouth 3 (three) times daily. ), Disp: 90 tablet, Rfl: 4 hydrOXYzine (ATARAX/VISTARIL) 50 MG tablet, Take 50 mg by mouth at bedtime. , Disp: , Rfl: 5 meloxicam (MOBIC) 15 MG tablet, Take 1 tablet (15 mg total) by mouth daily., Disp: 30 tablet, Rfl: 2 oxyCODONE-acetaminophen (PERCOCET) 5-325 MG tablet, Take 1-2 tablets by mouth every 4 (four) hours as needed for severe pain., Disp: 24 tablet, Rfl: 0 traMADol (ULTRAM) 50 MG tablet, Take 50-100 mg by mouth 2 (two) times daily as needed for moderate pain. , Disp: , Rfl:   No current facility-administered medications for this visit.     Blood pressure 110/63, pulse 66, height 5\' 6"  (1.676 m), weight 179 lb 6.4 oz (81.4 kg), last menstrual period 08/16/2018.  Physical Exam: Well healed Bartholin's gland surgery but has healed with a rent in her right vulva, not exactly sure how that came about, I used a mirror to show patient who is unbothered by it and does not want any more correction, it is not noticeable except by a "professional"  Diagnostic Tests:   Pathology: benign  Impression: S/p Bartholin's gland removal  Plan: No more pelvic rest is needed  Follow up: prn    Lazaro Arms, MD

## 2018-10-24 ENCOUNTER — Telehealth: Payer: Self-pay | Admitting: Obstetrics & Gynecology

## 2018-10-24 NOTE — Telephone Encounter (Signed)
Patient called, stated that she needs a plan B pill, the condom broke this morning.  [employer]  [phone]

## 2018-10-24 NOTE — Telephone Encounter (Signed)
Pt aware Plan B is OTC. Pt voiced understanding. JSY

## 2018-11-04 ENCOUNTER — Telehealth: Payer: Self-pay | Admitting: Neurology

## 2018-11-04 NOTE — Telephone Encounter (Signed)
error 

## 2018-11-04 NOTE — Telephone Encounter (Signed)
Pt called in and stated she needs documentation stating Dr Anne Hahn prescribed her rolling walker mailed out to her home. Or she states she can come pick up.  CB# [phone]

## 2018-11-04 NOTE — Telephone Encounter (Signed)
This patient was discharged from our practice, she is no longer patient here.

## 2018-11-13 ENCOUNTER — Other Ambulatory Visit (HOSPITAL_COMMUNITY): Payer: Self-pay | Admitting: Neurology

## 2018-11-13 ENCOUNTER — Other Ambulatory Visit: Payer: Self-pay | Admitting: Neurology

## 2018-11-13 DIAGNOSIS — G959 Disease of spinal cord, unspecified: Secondary | ICD-10-CM

## 2018-11-17 ENCOUNTER — Encounter (HOSPITAL_COMMUNITY): Payer: Self-pay | Admitting: Emergency Medicine

## 2018-11-17 ENCOUNTER — Emergency Department (HOSPITAL_COMMUNITY): Payer: Medicaid Other

## 2018-11-17 ENCOUNTER — Other Ambulatory Visit: Payer: Self-pay

## 2018-11-17 ENCOUNTER — Emergency Department (HOSPITAL_COMMUNITY)
Admission: EM | Admit: 2018-11-17 | Discharge: 2018-11-17 | Disposition: A | Discharge status: Home / Self Care | Payer: Medicaid Other | Attending: Emergency Medicine | Admitting: Emergency Medicine

## 2018-11-17 DIAGNOSIS — Z79899 Other long term (current) drug therapy: Secondary | ICD-10-CM | POA: Insufficient documentation

## 2018-11-17 DIAGNOSIS — R2242 Localized swelling, mass and lump, left lower limb: Secondary | ICD-10-CM | POA: Diagnosis not present

## 2018-11-17 DIAGNOSIS — L03012 Cellulitis of left finger: Secondary | ICD-10-CM | POA: Diagnosis not present

## 2018-11-17 DIAGNOSIS — M79645 Pain in left finger(s): Secondary | ICD-10-CM | POA: Diagnosis present

## 2018-11-17 MED ORDER — CEPHALEXIN 500 MG PO CAPS: 500.0000 mg | ORAL_CAPSULE | Freq: Three times a day (TID) | ORAL | 0 refills | Status: AC

## 2018-11-17 NOTE — ED Triage Notes (Signed)
Pt reports left ring finger pain and swelling after pulling a hangnail.  Also a "knot" on left calf with no injury.

## 2018-11-17 NOTE — Discharge Instructions (Addendum)
I want you to use warm compresses for your left finger swelling. I also want you to take an antibiotic for 5 days- take Keflex 500 mg three times daily for 7 days.
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
## 2018-11-20 ENCOUNTER — Encounter (HOSPITAL_COMMUNITY): Payer: Self-pay

## 2018-11-20 ENCOUNTER — Other Ambulatory Visit: Payer: Self-pay

## 2018-11-20 ENCOUNTER — Ambulatory Visit (HOSPITAL_COMMUNITY)
Admission: RE | Admit: 2018-11-20 | Discharge: 2018-11-20 | Disposition: A | Discharge status: Home / Self Care | Payer: Medicaid Other | Source: Ambulatory Visit | Attending: Neurology | Admitting: Neurology

## 2018-11-20 ENCOUNTER — Emergency Department (HOSPITAL_COMMUNITY)
Admission: EM | Admit: 2018-11-20 | Discharge: 2018-11-20 | Disposition: A | Discharge status: Home / Self Care | Payer: Medicaid Other | Attending: Emergency Medicine | Admitting: Emergency Medicine

## 2018-11-20 DIAGNOSIS — F909 Attention-deficit hyperactivity disorder, unspecified type: Secondary | ICD-10-CM | POA: Diagnosis not present

## 2018-11-20 DIAGNOSIS — R600 Localized edema: Secondary | ICD-10-CM | POA: Diagnosis present

## 2018-11-20 DIAGNOSIS — G959 Disease of spinal cord, unspecified: Secondary | ICD-10-CM | POA: Insufficient documentation

## 2018-11-20 DIAGNOSIS — L03012 Cellulitis of left finger: Secondary | ICD-10-CM

## 2018-11-20 DIAGNOSIS — Z79899 Other long term (current) drug therapy: Secondary | ICD-10-CM | POA: Diagnosis not present

## 2018-11-20 MED ORDER — LIDOCAINE HCL (PF) 2 % IJ SOLN
INTRAMUSCULAR | Status: AC
  Filled 2018-11-20: qty 20

## 2018-11-20 MED ORDER — HYDROCODONE-ACETAMINOPHEN 5-325 MG PO TABS: 1.0000 | ORAL_TABLET | Freq: Four times a day (QID) | ORAL | 0 refills | Status: DC | PRN

## 2018-11-20 MED ORDER — LIDOCAINE HCL (PF) 2 % IJ SOLN
5.0000 mL | Freq: Once | INTRAMUSCULAR | Status: AC
  Administered 2018-11-20: 5 mL

## 2018-11-20 MED ORDER — POVIDONE-IODINE 10 % EX SOLN
CUTANEOUS | Status: DC | PRN
  Administered 2018-11-20: 12:00:00 via TOPICAL
  Filled 2018-11-20: qty 15

## 2018-11-20 NOTE — ED Triage Notes (Signed)
Pt here for recheck of left ring finger.  Reports was put on antibiotics for infection 2 days ago and since then has noticed some green drainage and increased pain.

## 2018-11-20 NOTE — Discharge Instructions (Addendum)
Finish your antibiotics as discussed, continue soaking your finger in warm Epsom salt water twice daily.  Keep your finger covered, you may also use mild soap and water for washing the wound site.  Do not drive within 4 hours of taking hydrocodone as this medication will make you drowsy.  Follow-up with your primary doctor for any persistent symptoms.
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
## 2019-02-04 ENCOUNTER — Encounter: Payer: Self-pay | Admitting: Family Medicine

## 2019-02-04 ENCOUNTER — Ambulatory Visit (INDEPENDENT_AMBULATORY_CARE_PROVIDER_SITE_OTHER): Payer: Medicare Other | Admitting: Family Medicine

## 2019-02-04 ENCOUNTER — Other Ambulatory Visit: Payer: Self-pay

## 2019-02-04 VITALS — BP 136/84 | HR 99 | Temp 99.2°F | Resp 12 | Ht 67.0 in | Wt 163.0 lb

## 2019-02-04 DIAGNOSIS — M25562 Pain in left knee: Secondary | ICD-10-CM | POA: Diagnosis not present

## 2019-02-04 DIAGNOSIS — M4802 Spinal stenosis, cervical region: Secondary | ICD-10-CM

## 2019-02-04 DIAGNOSIS — I10 Essential (primary) hypertension: Secondary | ICD-10-CM

## 2019-02-04 DIAGNOSIS — G8929 Other chronic pain: Secondary | ICD-10-CM | POA: Diagnosis not present

## 2019-02-04 MED ORDER — AMLODIPINE BESYLATE 2.5 MG PO TABS: 2.5000 mg | ORAL_TABLET | Freq: Every day | ORAL | 3 refills | Status: DC

## 2019-02-04 NOTE — Patient Instructions (Addendum)
    Thank you for coming into the office today. I appreciate the opportunity to provide you with the care for your health and wellness. Today we discussed: establish care  Follow up: 3 months   Get release signed for Neurology office for records  Continue to eat well and balanced to keep weight controlled.  Start taking the Norvasc blood pressure medication Avoid extra salt in diet.  Please continue to practice social distancing to keep you, your family, and our community safe.  If you must go out, please wear a Mask and practice good handwashing.  [place] YOUR HANDS WELL AND FREQUENTLY. AVOID TOUCHING YOUR FACE, UNLESS YOUR HANDS ARE FRESHLY WASHED.  GET FRESH AIR DAILY. STAY HYDRATED WITH WATER.   It was a pleasure to see you and I look forward to continuing to work together on your health and well-being. Please do not hesitate to call the office if you need care or have questions about your care.  Have a wonderful day and week. With Gratitude, Cherly Beach, DNP, AGNP-BC
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
## 2019-02-10 ENCOUNTER — Ambulatory Visit: Payer: Medicare Other | Admitting: Orthopaedic Surgery

## 2019-03-03 ENCOUNTER — Ambulatory Visit (INDEPENDENT_AMBULATORY_CARE_PROVIDER_SITE_OTHER): Payer: Medicare Other | Admitting: Orthopaedic Surgery

## 2019-03-03 ENCOUNTER — Encounter: Payer: Self-pay | Admitting: Orthopaedic Surgery

## 2019-03-03 ENCOUNTER — Other Ambulatory Visit: Payer: Self-pay

## 2019-03-03 ENCOUNTER — Ambulatory Visit: Payer: Medicare Other

## 2019-03-03 VITALS — BP 119/67 | HR 65 | Temp 97.5°F | Ht 67.0 in | Wt 167.5 lb

## 2019-03-03 DIAGNOSIS — M25562 Pain in left knee: Secondary | ICD-10-CM | POA: Diagnosis not present

## 2019-03-03 DIAGNOSIS — M25561 Pain in right knee: Secondary | ICD-10-CM | POA: Diagnosis not present

## 2019-03-03 DIAGNOSIS — G894 Chronic pain syndrome: Secondary | ICD-10-CM | POA: Diagnosis not present

## 2019-03-03 DIAGNOSIS — G8929 Other chronic pain: Secondary | ICD-10-CM | POA: Diagnosis not present
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
## 2019-04-14 ENCOUNTER — Ambulatory Visit (INDEPENDENT_AMBULATORY_CARE_PROVIDER_SITE_OTHER): Payer: Medicare Other | Admitting: Orthopaedic Surgery

## 2019-04-14 ENCOUNTER — Other Ambulatory Visit: Payer: Self-pay

## 2019-04-14 ENCOUNTER — Encounter: Payer: Self-pay | Admitting: Orthopaedic Surgery

## 2019-04-14 VITALS — BP 95/81 | HR 86 | Ht 67.0 in | Wt 174.0 lb

## 2019-04-14 DIAGNOSIS — M25562 Pain in left knee: Secondary | ICD-10-CM

## 2019-04-14 DIAGNOSIS — G8929 Other chronic pain: Secondary | ICD-10-CM

## 2019-04-14 DIAGNOSIS — G894 Chronic pain syndrome: Secondary | ICD-10-CM

## 2019-04-14 DIAGNOSIS — M25561 Pain in right knee: Secondary | ICD-10-CM | POA: Diagnosis not present
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
## 2019-05-13 ENCOUNTER — Encounter: Payer: Self-pay | Admitting: Family Medicine

## 2019-05-13 ENCOUNTER — Other Ambulatory Visit: Payer: Self-pay

## 2019-05-13 ENCOUNTER — Ambulatory Visit (INDEPENDENT_AMBULATORY_CARE_PROVIDER_SITE_OTHER): Payer: Medicare Other | Admitting: Family Medicine

## 2019-05-13 DIAGNOSIS — I10 Essential (primary) hypertension: Secondary | ICD-10-CM | POA: Insufficient documentation
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
## 2019-05-13 NOTE — Patient Instructions (Addendum)
  I appreciate the opportunity to provide you with care for your health and wellness. Today we discussed: blood pressure  Follow up: 6 months annual-vision no pap  No labs or referrals today  Please continue to take Blood Pressure medication  Eat a well balanced diet and hydrate with water.  I hope you have a wonderful, happy, safe, and healthy Holiday Season! See you in the New Year :)  Please continue to practice social distancing to keep you, your family, and our community safe.  If you must go out, please wear a mask and practice good handwashing.  It was a pleasure to see you and I look forward to continuing to work together on your health and well-being. Please do not hesitate to call the office if you need care or have questions about your care.  Have a wonderful day and week. With Gratitude, Cherly Beach, DNP, AGNP-BC

## 2019-05-26 ENCOUNTER — Encounter: Payer: Self-pay | Admitting: Orthopaedic Surgery

## 2019-05-26 ENCOUNTER — Other Ambulatory Visit: Payer: Self-pay

## 2019-05-26 ENCOUNTER — Ambulatory Visit (INDEPENDENT_AMBULATORY_CARE_PROVIDER_SITE_OTHER): Payer: Medicare Other | Admitting: Orthopaedic Surgery

## 2019-05-26 VITALS — BP 113/61 | HR 74 | Temp 97.2°F | Ht 67.0 in | Wt 177.0 lb

## 2019-05-26 DIAGNOSIS — G894 Chronic pain syndrome: Secondary | ICD-10-CM

## 2019-05-26 DIAGNOSIS — M25561 Pain in right knee: Secondary | ICD-10-CM

## 2019-05-26 DIAGNOSIS — G8929 Other chronic pain: Secondary | ICD-10-CM | POA: Diagnosis not present

## 2019-05-26 DIAGNOSIS — M25562 Pain in left knee: Secondary | ICD-10-CM

## 2019-05-26 MED ORDER — TIZANIDINE HCL 4 MG PO TABS: ORAL_TABLET | ORAL | 3 refills | Status: DC

## 2019-05-26 NOTE — Progress Notes (Signed)
CC: Both of my knees are hurting. I would like an injection in both knees.  The patient has had chronic pain and tenderness of both knees for some time.  Injections help.  There is no locking or giving way of the knee.  There is no new trauma. There is no redness or signs of infections.  The knees have a mild effusion and some crepitus.  There is no redness or signs of recent trauma.  Right knee ROM is 0-110 and left knee ROM is 0-105.  Impression:  Chronic pain of the both knees  Return:  2 months  PROCEDURE NOTE:  The patient requests injections of both knees, verbal consent was obtained.  The left and right knee were individually prepped appropriately after time out was performed.   Sterile technique was observed and injection of 1 cc of Depo-Medrol 40 mg with several cc's of plain xylocaine. Anesthesia was provided by ethyl chloride and a 20-gauge needle was used to inject each knee area. The injections were tolerated well.  A band aid dressing was applied.  The patient was advised to apply ice later today and tomorrow to the injection sight as needed.   I also called in muscle relaxant.  Electronically Signed Sanjuana Kava, MD 12/1/20202:57 PM

## 2019-06-10 ENCOUNTER — Emergency Department (HOSPITAL_COMMUNITY)
Admission: EM | Admit: 2019-06-10 | Discharge: 2019-06-10 | Disposition: A | Discharge status: Home / Self Care | Payer: Medicare Other | Attending: Emergency Medicine | Admitting: Emergency Medicine

## 2019-06-10 ENCOUNTER — Encounter (HOSPITAL_COMMUNITY): Payer: Self-pay | Admitting: Emergency Medicine

## 2019-06-10 ENCOUNTER — Emergency Department (HOSPITAL_COMMUNITY)
Admission: EM | Admit: 2019-06-10 | Discharge: 2019-06-10 | Disposition: A | Discharge status: Home / Self Care | Payer: Medicare Other | Source: Home / Self Care | Attending: Emergency Medicine | Admitting: Emergency Medicine

## 2019-06-10 ENCOUNTER — Other Ambulatory Visit: Payer: Self-pay

## 2019-06-10 ENCOUNTER — Emergency Department (HOSPITAL_COMMUNITY): Payer: Medicare Other

## 2019-06-10 DIAGNOSIS — M549 Dorsalgia, unspecified: Secondary | ICD-10-CM | POA: Insufficient documentation

## 2019-06-10 DIAGNOSIS — G8929 Other chronic pain: Secondary | ICD-10-CM | POA: Insufficient documentation

## 2019-06-10 DIAGNOSIS — M79672 Pain in left foot: Secondary | ICD-10-CM | POA: Insufficient documentation

## 2019-06-10 DIAGNOSIS — Z791 Long term (current) use of non-steroidal anti-inflammatories (NSAID): Secondary | ICD-10-CM | POA: Diagnosis not present

## 2019-06-10 DIAGNOSIS — M25512 Pain in left shoulder: Secondary | ICD-10-CM | POA: Insufficient documentation

## 2019-06-10 DIAGNOSIS — R0789 Other chest pain: Secondary | ICD-10-CM | POA: Diagnosis not present

## 2019-06-10 DIAGNOSIS — I10 Essential (primary) hypertension: Secondary | ICD-10-CM | POA: Diagnosis not present

## 2019-06-10 DIAGNOSIS — R079 Chest pain, unspecified: Secondary | ICD-10-CM | POA: Insufficient documentation

## 2019-06-10 DIAGNOSIS — Z79899 Other long term (current) drug therapy: Secondary | ICD-10-CM | POA: Insufficient documentation

## 2019-06-10 DIAGNOSIS — M25552 Pain in left hip: Secondary | ICD-10-CM | POA: Insufficient documentation

## 2019-06-10 DIAGNOSIS — W19XXXA Unspecified fall, initial encounter: Secondary | ICD-10-CM

## 2019-06-10 LAB — POC URINE PREG, ED: Preg Test, Ur: NEGATIVE

## 2019-06-10 MED ORDER — METHOCARBAMOL 500 MG PO TABS: 500.0000 mg | ORAL_TABLET | Freq: Two times a day (BID) | ORAL | 0 refills | Status: DC | PRN

## 2019-06-10 MED ORDER — DEXAMETHASONE SODIUM PHOSPHATE 10 MG/ML IJ SOLN
10.0000 mg | Freq: Once | INTRAMUSCULAR | Status: AC
  Administered 2019-06-10: 10:00:00 10 mg via INTRAMUSCULAR
  Filled 2019-06-10: qty 1

## 2019-06-10 MED ORDER — METHOCARBAMOL 500 MG PO TABS
1000.0000 mg | ORAL_TABLET | Freq: Once | ORAL | Status: AC
  Administered 2019-06-10: 10:00:00 1000 mg via ORAL
  Filled 2019-06-10: qty 2

## 2019-06-10 NOTE — ED Triage Notes (Signed)
PT c/o muscle spasms, neck/back pain and bilateral rib pain worsening x1 day unrelieved from fibromyalgia medications.
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
## 2019-06-10 NOTE — Discharge Instructions (Addendum)
Your EKG is normal - no signs of heart disease Take your medicines as prescribed by your doctor We have given you a steroid shot and a muscle relaxer - you are not to drive for 12 hours for safety reasons.  ER for worsening symptoms.

## 2019-06-10 NOTE — Discharge Instructions (Signed)
You were seen in the emergency department today after a fall.  Your imaging did not show any traumatic injuries.  We are sending you home with Robaxin to help with pain.  - Robaxin is the muscle relaxer I have prescribed, this is meant to help with muscle tightness. Be aware that this medication may make you drowsy therefore the first time you take this it should be at a time you are in an environment where you can rest. Do not drive or operate heavy machinery when taking this medication. Do not drink alcohol or take other sedating medications with this medicine such as narcotics or benzodiazepines.   You make take Tylenol per over the counter dosing with these medications.   We have prescribed you new medication(s) today. Discuss the medications prescribed today with your pharmacist as they can have adverse effects and interactions with your other medicines including over the counter and prescribed medications. Seek medical evaluation if you start to experience new or abnormal symptoms after taking one of these medicines, seek care immediately if you start to experience difficulty breathing, feeling of your throat closing, facial swelling, or rash as these could be indications of a more serious allergic reaction   Please follow-up with your primary care provider within a few days.  Return to the ER for new or worsening symptoms or any other concerns.
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
## 2019-07-28 ENCOUNTER — Ambulatory Visit: Payer: Medicare Other | Admitting: Orthopaedic Surgery

## 2019-08-04 ENCOUNTER — Ambulatory Visit (INDEPENDENT_AMBULATORY_CARE_PROVIDER_SITE_OTHER): Payer: Medicare Other | Admitting: Orthopaedic Surgery

## 2019-08-04 ENCOUNTER — Other Ambulatory Visit: Payer: Self-pay

## 2019-08-04 ENCOUNTER — Encounter: Payer: Self-pay | Admitting: Orthopaedic Surgery

## 2019-08-04 DIAGNOSIS — M25562 Pain in left knee: Secondary | ICD-10-CM | POA: Diagnosis not present

## 2019-08-04 DIAGNOSIS — G8929 Other chronic pain: Secondary | ICD-10-CM

## 2019-08-04 DIAGNOSIS — M25561 Pain in right knee: Secondary | ICD-10-CM

## 2019-08-04 NOTE — Progress Notes (Signed)
CC: Both of my knees are hurting. I would like an injection in both knees.  The patient has had chronic pain and tenderness of both knees for some time.  Injections help.  There is no locking or giving way of the knee.  There is no new trauma. There is no redness or signs of infections.  The knees have a mild effusion and some crepitus.  There is no redness or signs of recent trauma.  Right knee ROM is 0-110 and left knee ROM is 0-105.  Impression:  Chronic pain of the both knees  Return:  2 months  PROCEDURE NOTE:  The patient requests injections of both knees, verbal consent was obtained.  The left and right knee were individually prepped appropriately after time out was performed.   Sterile technique was observed and injection of 1 cc of Depo-Medrol 40 mg with several cc's of plain xylocaine. Anesthesia was provided by ethyl chloride and a 20-gauge needle was used to inject each knee area. The injections were tolerated well.  A band aid dressing was applied.  The patient was advised to apply ice later today and tomorrow to the injection sight as needed.   Electronically Signed [name], MD 2/9/20219:20 AM

## 2019-08-22 DIAGNOSIS — G5603 Carpal tunnel syndrome, bilateral upper limbs: Secondary | ICD-10-CM | POA: Insufficient documentation

## 2019-08-22 DIAGNOSIS — M130 Polyarthritis, unspecified: Secondary | ICD-10-CM | POA: Insufficient documentation

## 2019-08-22 DIAGNOSIS — M25561 Pain in right knee: Secondary | ICD-10-CM | POA: Insufficient documentation

## 2019-08-22 DIAGNOSIS — R209 Unspecified disturbances of skin sensation: Secondary | ICD-10-CM | POA: Insufficient documentation

## 2019-08-22 DIAGNOSIS — F319 Bipolar disorder, unspecified: Secondary | ICD-10-CM | POA: Insufficient documentation

## 2019-08-22 DIAGNOSIS — M797 Fibromyalgia: Secondary | ICD-10-CM | POA: Insufficient documentation

## 2019-08-22 DIAGNOSIS — R269 Unspecified abnormalities of gait and mobility: Secondary | ICD-10-CM | POA: Insufficient documentation

## 2019-08-22 DIAGNOSIS — R600 Localized edema: Secondary | ICD-10-CM | POA: Insufficient documentation

## 2019-08-22 HISTORY — DX: Carpal tunnel syndrome, bilateral upper limbs: G56.03

## 2019-08-25 ENCOUNTER — Other Ambulatory Visit (HOSPITAL_COMMUNITY)
Admission: RE | Admit: 2019-08-25 | Discharge: 2019-08-25 | Disposition: A | Discharge status: Home / Self Care | Payer: Medicare Other | Source: Other Acute Inpatient Hospital | Attending: Family Medicine | Admitting: Family Medicine

## 2019-08-25 ENCOUNTER — Ambulatory Visit (INDEPENDENT_AMBULATORY_CARE_PROVIDER_SITE_OTHER): Payer: Medicare Other | Admitting: Family Medicine

## 2019-08-25 ENCOUNTER — Other Ambulatory Visit: Payer: Self-pay

## 2019-08-25 ENCOUNTER — Encounter: Payer: Self-pay | Admitting: Family Medicine

## 2019-08-25 VITALS — BP 130/70 | HR 67 | Temp 96.8°F | Resp 15 | Ht 67.0 in | Wt 188.0 lb

## 2019-08-25 DIAGNOSIS — R102 Pelvic and perineal pain unspecified side: Secondary | ICD-10-CM

## 2019-08-25 DIAGNOSIS — N926 Irregular menstruation, unspecified: Secondary | ICD-10-CM | POA: Diagnosis not present

## 2019-08-25 DIAGNOSIS — R319 Hematuria, unspecified: Secondary | ICD-10-CM | POA: Diagnosis not present

## 2019-08-25 DIAGNOSIS — R1010 Upper abdominal pain, unspecified: Secondary | ICD-10-CM

## 2019-08-25 DIAGNOSIS — Z124 Encounter for screening for malignant neoplasm of cervix: Secondary | ICD-10-CM

## 2019-08-25 DIAGNOSIS — I10 Essential (primary) hypertension: Secondary | ICD-10-CM

## 2019-08-25 LAB — POCT URINALYSIS DIP (CLINITEK)
Bilirubin, UA: NEGATIVE
Glucose, UA: NEGATIVE mg/dL
Ketones, POC UA: NEGATIVE mg/dL
Leukocytes, UA: NEGATIVE
Nitrite, UA: NEGATIVE
POC PROTEIN,UA: 100 — AB
Spec Grav, UA: >=1.03 — AB (ref 1.010–1.025)
Urobilinogen, UA: 0.2 E.U./dL
pH, UA: 5.5 (ref 5.0–8.0)

## 2019-08-25 LAB — POCT URINE PREGNANCY: Preg Test, Ur: NEGATIVE

## 2019-08-25 NOTE — Assessment & Plan Note (Signed)
Veronica [name] is encouraged to maintain a well balanced diet that is low in salt. Controlled, continue current medication regimen.   No refills needed.  Additionally, she is also reminded that exercise is beneficial for heart health and control of  Blood pressure. 30-60 minutes daily is recommended-walking was suggested.

## 2019-08-25 NOTE — Assessment & Plan Note (Signed)
Needs updated Pap.
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
## 2019-08-25 NOTE — Assessment & Plan Note (Signed)
Pain in the upper abdomen that appears to be correlated with bladder stretching, pelvic pain, back pain.  Also reported that she had discomfort with intercourse has had a ovarian cyst in the past reports some similarities.  Has not had a period since January negative home pregnancy test negative pregnancy test here in the office.  Urine had trace blood will send out to be on safe side for culture.  Denies having any discharge or signs or symptoms of a urinary tract infection.  Reports that she has a tender discomfort throughout the abdomen from right above the bellybutton below no palpable masses on PE.  Needs updated Pap as well.  Referral to GYN for further work-up questionable ovarian cyst versus endometriosis versus other issue concerning bladder or kidney.  Will treat if culture presents with infection. Patient acknowledged agreement and understanding of the plan.

## 2019-08-25 NOTE — Assessment & Plan Note (Signed)
Pain in the upper abdomen that appears to be correlated with bladder stretching, pelvic pain, back pain.  Also reported that she had discomfort with intercourse has had a ovarian cyst in the past reports some similarities.  Has not had a period since January negative home pregnancy test negative pregnancy test here in the office.  Urine had trace blood will send out to be on safe side for culture.  Denies having any discharge or signs or symptoms of a urinary tract infection.  Reports that she has a tender discomfort throughout the abdomen from right above the bellybutton below no palpable masses on PE.  Needs updated Pap as well.  Referral to GYN for further work-up questionable ovarian cyst versus endometriosis versus other issue concerning bladder or kidney.  Will treat if culture presents with infection. Patient acknowledged agreement and understanding of the plan.  

## 2019-08-25 NOTE — Patient Instructions (Signed)
I appreciate the opportunity to provide you with care for your health and wellness. Today we discussed: abdominal pain  Follow up: 11/10/2019 as scheduled  No labs   Referrals to OB-GYN for pap and possible ovarian cyst versus endometriosis    Please continue to practice social distancing to keep you, your family, and our community safe.  If you must go out, please wear a mask and practice good handwashing.  It was a pleasure to see you and I look forward to continuing to work together on your health and well-being. Please do not hesitate to call the office if you need care or have questions about your care.  Have a wonderful day and week. With Gratitude, Tereasa Coop, DNP, AGNP-BC

## 2019-08-25 NOTE — Assessment & Plan Note (Signed)
Send out for culture to make sure that she is not having the start of a urinary tract infection.

## 2019-08-26 ENCOUNTER — Other Ambulatory Visit: Payer: Self-pay

## 2019-08-26 DIAGNOSIS — R319 Hematuria, unspecified: Secondary | ICD-10-CM

## 2019-08-26 LAB — URINE CULTURE

## 2019-08-27 ENCOUNTER — Other Ambulatory Visit (HOSPITAL_COMMUNITY)
Admission: RE | Admit: 2019-08-27 | Discharge: 2019-08-27 | Disposition: A | Discharge status: Home / Self Care | Payer: Medicare Other | Source: Other Acute Inpatient Hospital | Attending: Family Medicine | Admitting: Family Medicine

## 2019-08-27 DIAGNOSIS — R319 Hematuria, unspecified: Secondary | ICD-10-CM | POA: Insufficient documentation

## 2019-08-28 LAB — URINE CULTURE

## 2019-09-10 ENCOUNTER — Telehealth: Payer: Self-pay | Admitting: Obstetrics & Gynecology

## 2019-09-10 NOTE — Telephone Encounter (Signed)

## 2019-09-11 ENCOUNTER — Other Ambulatory Visit: Payer: Self-pay

## 2019-09-11 ENCOUNTER — Ambulatory Visit (INDEPENDENT_AMBULATORY_CARE_PROVIDER_SITE_OTHER): Payer: Medicare Other | Admitting: Obstetrics & Gynecology

## 2019-09-11 ENCOUNTER — Encounter: Payer: Self-pay | Admitting: Obstetrics & Gynecology

## 2019-09-11 ENCOUNTER — Other Ambulatory Visit (HOSPITAL_COMMUNITY)
Admission: RE | Admit: 2019-09-11 | Discharge: 2019-09-11 | Disposition: A | Discharge status: Home / Self Care | Payer: Medicare Other | Source: Ambulatory Visit | Attending: Obstetrics & Gynecology | Admitting: Obstetrics & Gynecology

## 2019-09-11 VITALS — BP 116/68 | HR 60 | Ht 66.0 in | Wt 192.0 lb

## 2019-09-11 DIAGNOSIS — Z01419 Encounter for gynecological examination (general) (routine) without abnormal findings: Secondary | ICD-10-CM | POA: Insufficient documentation

## 2019-09-11 DIAGNOSIS — Z1151 Encounter for screening for human papillomavirus (HPV): Secondary | ICD-10-CM | POA: Insufficient documentation
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
## 2019-09-15 DIAGNOSIS — G4700 Insomnia, unspecified: Secondary | ICD-10-CM | POA: Insufficient documentation

## 2019-09-15 LAB — CYTOLOGY - PAP
Chlamydia: NEGATIVE
Comment: NEGATIVE
Comment: NEGATIVE
Comment: NORMAL
Diagnosis: NEGATIVE
High risk HPV: NEGATIVE
Neisseria Gonorrhea: NEGATIVE

## 2019-10-01 ENCOUNTER — Ambulatory Visit (INDEPENDENT_AMBULATORY_CARE_PROVIDER_SITE_OTHER): Payer: Medicare Other | Admitting: Orthopaedic Surgery

## 2019-10-01 ENCOUNTER — Encounter: Payer: Self-pay | Admitting: Orthopaedic Surgery

## 2019-10-01 ENCOUNTER — Other Ambulatory Visit: Payer: Self-pay

## 2019-10-01 VITALS — Ht 66.0 in | Wt 190.0 lb

## 2019-10-01 DIAGNOSIS — G8929 Other chronic pain: Secondary | ICD-10-CM | POA: Diagnosis not present

## 2019-10-01 DIAGNOSIS — M25562 Pain in left knee: Secondary | ICD-10-CM | POA: Diagnosis not present

## 2019-10-01 DIAGNOSIS — M25561 Pain in right knee: Secondary | ICD-10-CM | POA: Diagnosis not present

## 2019-10-01 DIAGNOSIS — G894 Chronic pain syndrome: Secondary | ICD-10-CM

## 2019-10-01 NOTE — Progress Notes (Signed)
PROCEDURE NOTE:  The patient requests injections of the right knee , verbal consent was obtained.  The right knee was prepped appropriately after time out was performed.   Sterile technique was observed and injection of 1 cc of Depo-Medrol 40 mg with several cc's of plain xylocaine. Anesthesia was provided by ethyl chloride and a 20-gauge needle was used to inject the knee area. The injection was tolerated well.  A band aid dressing was applied.  The patient was advised to apply ice later today and tomorrow to the injection sight as needed.  PROCEDURE NOTE:  The patient requests injections of the left knee , verbal consent was obtained.  The left knee was prepped appropriately after time out was performed.   Sterile technique was observed and injection of 1 cc of Depo-Medrol 40 mg with several cc's of plain xylocaine. Anesthesia was provided by ethyl chloride and a 20-gauge needle was used to inject the knee area. The injection was tolerated well.  A band aid dressing was applied.  The patient was advised to apply ice later today and tomorrow to the injection sight as needed.  Return in two months.  Electronically Signed [name], MD 4/8/20218:51 AM

## 2019-10-08 ENCOUNTER — Telehealth: Payer: Self-pay | Admitting: Orthopaedic Surgery

## 2019-10-08 NOTE — Telephone Encounter (Signed)
Our clinic's medical records provider [facility] had responded to records request from nurse paralegal [facility] of [place] Mc, Drema Halon, and [place] of [place], [place]; a request was then received via fax on 10/05/19 requesting copies of "all spine radiology films on CD"; signed authorization by patient is attached. Spoke with our clinic site manager Toniann Fail. Request in processing per our radiology tech, and will be ready after 3:00pm today. I called back to requester, Ms. Tarry Kos, to relay status, and to verify how we are to forward the CD, and her voice mail picked up, but was muffled, and did not have option to leave a message. Faxing note to inquire due to unable to reach by phone at [phone] / fax [phone].

## 2019-10-12 NOTE — Telephone Encounter (Signed)
Film received today from [facility]. Mailing through [facility] courier (going out tomorrow,next mailing day, 10/13/19) to:  Attn Hans Eden, Nurse Paralegal [phone] / [address], [place], [place] [address])  - called and left voice message to notify, and requested call back.

## 2019-10-16 NOTE — Telephone Encounter (Signed)
Mailing of films taken care of by clinic supervisor Wendy May. Confirmation of delivery of films to requester as noted Received: delivered 10/15/19 at 8:03am at [facility] address on request: Tracking#[id number]

## 2019-11-10 ENCOUNTER — Encounter: Payer: Medicare Other | Admitting: Family Medicine

## 2019-11-10 DIAGNOSIS — E559 Vitamin D deficiency, unspecified: Secondary | ICD-10-CM | POA: Insufficient documentation

## 2019-11-12 ENCOUNTER — Encounter: Payer: Self-pay | Admitting: Physical Medicine and Rehabilitation

## 2019-11-19 ENCOUNTER — Encounter
Payer: Medicare Other | Attending: Physical Medicine and Rehabilitation | Admitting: Physical Medicine and Rehabilitation

## 2019-12-01 ENCOUNTER — Ambulatory Visit (INDEPENDENT_AMBULATORY_CARE_PROVIDER_SITE_OTHER): Payer: Medicare Other | Admitting: Orthopaedic Surgery

## 2019-12-01 ENCOUNTER — Encounter: Payer: Self-pay | Admitting: Orthopaedic Surgery

## 2019-12-01 ENCOUNTER — Emergency Department (HOSPITAL_COMMUNITY)
Admission: EM | Admit: 2019-12-01 | Discharge: 2019-12-01 | Discharge status: Against Medical Advice | Payer: Medicare Other

## 2019-12-01 ENCOUNTER — Other Ambulatory Visit: Payer: Self-pay

## 2019-12-01 VITALS — Ht 66.0 in | Wt 188.4 lb

## 2019-12-01 DIAGNOSIS — M25561 Pain in right knee: Secondary | ICD-10-CM

## 2019-12-01 DIAGNOSIS — G8929 Other chronic pain: Secondary | ICD-10-CM

## 2019-12-01 DIAGNOSIS — G894 Chronic pain syndrome: Secondary | ICD-10-CM

## 2019-12-01 DIAGNOSIS — M25562 Pain in left knee: Secondary | ICD-10-CM

## 2019-12-01 NOTE — Progress Notes (Signed)
PROCEDURE NOTE:  The patient requests injections of the left knee , verbal consent was obtained.  The left knee was prepped appropriately after time out was performed.   Sterile technique was observed and injection of 1 cc of Depo-Medrol 40 mg with several cc's of plain xylocaine. Anesthesia was provided by ethyl chloride and a 20-gauge needle was used to inject the knee area. The injection was tolerated well.  A band aid dressing was applied.  The patient was advised to apply ice later today and tomorrow to the injection sight as needed.  PROCEDURE NOTE:  The patient requests injections of the right knee , verbal consent was obtained.  The right knee was prepped appropriately after time out was performed.   Sterile technique was observed and injection of 1 cc of Depo-Medrol 40 mg with several cc's of plain xylocaine. Anesthesia was provided by ethyl chloride and a 20-gauge needle was used to inject the knee area. The injection was tolerated well.  A band aid dressing was applied.  The patient was advised to apply ice later today and tomorrow to the injection sight as needed.  Return in two months.  Electronically Signed [name], MD 6/8/20218:55 AM

## 2019-12-02 ENCOUNTER — Emergency Department (HOSPITAL_COMMUNITY)
Admission: EM | Admit: 2019-12-02 | Discharge: 2019-12-02 | Disposition: A | Discharge status: Home / Self Care | Payer: Medicare Other | Attending: Emergency Medicine | Admitting: Emergency Medicine

## 2019-12-02 ENCOUNTER — Emergency Department (HOSPITAL_COMMUNITY): Payer: Medicare Other

## 2019-12-02 ENCOUNTER — Encounter
Payer: Medicare Other | Attending: Physical Medicine and Rehabilitation | Admitting: Physical Medicine and Rehabilitation

## 2019-12-02 ENCOUNTER — Encounter: Payer: Self-pay | Admitting: Physical Medicine and Rehabilitation

## 2019-12-02 ENCOUNTER — Telehealth: Payer: Self-pay | Admitting: Orthopaedic Surgery

## 2019-12-02 ENCOUNTER — Other Ambulatory Visit: Payer: Self-pay

## 2019-12-02 VITALS — BP 128/75 | HR 61 | Temp 97.7°F | Ht 67.0 in | Wt 185.0 lb

## 2019-12-02 DIAGNOSIS — M25561 Pain in right knee: Secondary | ICD-10-CM | POA: Diagnosis present

## 2019-12-02 DIAGNOSIS — Z5321 Procedure and treatment not carried out due to patient leaving prior to being seen by health care provider: Secondary | ICD-10-CM | POA: Diagnosis not present

## 2019-12-02 DIAGNOSIS — M25469 Effusion, unspecified knee: Secondary | ICD-10-CM | POA: Insufficient documentation

## 2019-12-02 DIAGNOSIS — M7918 Myalgia, other site: Secondary | ICD-10-CM | POA: Diagnosis not present

## 2019-12-02 LAB — COMPREHENSIVE METABOLIC PANEL
ALT: 19 U/L (ref 0–44)
AST: 20 U/L (ref 15–41)
Albumin: 4.1 g/dL (ref 3.5–5.0)
Alkaline Phosphatase: 47 U/L (ref 38–126)
Anion gap: 10 (ref 5–15)
BUN: 8 mg/dL (ref 6–20)
CO2: 26 mmol/L (ref 22–32)
Calcium: 9.3 mg/dL (ref 8.9–10.3)
Chloride: 102 mmol/L (ref 98–111)
Creatinine, Ser: 0.74 mg/dL (ref 0.44–1.00)
GFR calc Af Amer: >60 mL/min (ref >60)
GFR calc non Af Amer: >60 mL/min (ref >60)
Glucose, Bld: 98 mg/dL (ref 70–99)
Potassium: 3.6 mmol/L (ref 3.5–5.1)
Sodium: 138 mmol/L (ref 135–145)
Total Bilirubin: 0.9 mg/dL (ref 0.3–1.2)
Total Protein: 7.8 g/dL (ref 6.5–8.1)

## 2019-12-02 LAB — URINALYSIS, ROUTINE W REFLEX MICROSCOPIC
Bacteria, UA: NONE SEEN
Bilirubin Urine: NEGATIVE
Glucose, UA: NEGATIVE mg/dL
Hgb urine dipstick: NEGATIVE
Ketones, ur: 20 mg/dL — AB
Leukocytes,Ua: NEGATIVE
Nitrite: NEGATIVE
Protein, ur: 100 mg/dL — AB
Specific Gravity, Urine: 1.029 (ref 1.005–1.030)
pH: 6 (ref 5.0–8.0)

## 2019-12-02 LAB — CBC
HCT: 41.6 % (ref 36.0–46.0)
Hemoglobin: 14.1 g/dL (ref 12.0–15.0)
MCH: 31.5 pg (ref 26.0–34.0)
MCHC: 33.9 g/dL (ref 30.0–36.0)
MCV: 93.1 fL (ref 80.0–100.0)
Platelets: 269 10*3/uL (ref 150–400)
RBC: 4.47 MIL/uL (ref 3.87–5.11)
RDW: 12.2 % (ref 11.5–15.5)
WBC: 8.4 10*3/uL (ref 4.0–10.5)
nRBC: 0 % (ref 0.0–0.2)

## 2019-12-02 LAB — I-STAT BETA HCG BLOOD, ED (MC, WL, AP ONLY): I-stat hCG, quantitative: <5 m[IU]/mL (ref <5)

## 2019-12-02 LAB — LIPASE, BLOOD: Lipase: 16 U/L (ref 11–51)

## 2019-12-02 MED ORDER — SODIUM CHLORIDE 0.9% FLUSH: 3.0000 mL | Freq: Once | INTRAVENOUS | Status: DC

## 2019-12-02 MED ORDER — AMITRIPTYLINE HCL 10 MG PO TABS: 10.0000 mg | ORAL_TABLET | Freq: Every day | ORAL | 0 refills | Status: DC

## 2019-12-02 NOTE — Telephone Encounter (Signed)
Get her the brace she desires.

## 2019-12-02 NOTE — Telephone Encounter (Signed)
Veronica [name] just called stating that she was fine after her bilateral knee injections yesterday.  She said later on she started throwing up and her right knee started swelling. She said the left knee is doing fine.  She said she developed a terrible headache.  She said she went to the ED but decided not to wait because she was still throwing up.  I asked her if she was icing the knees and she said she was.  She said the right knee is swollen and she wants to get a knee brace/sleeve for it.  Can you write a prescription for this or do we have one here that she can get?  Please advise

## 2019-12-02 NOTE — ED Triage Notes (Signed)
Pt here for eval of R knee swelling and pain and inability to bear weight since receiving cortisone shot in same yesterday. Sent here by ortho for further eval. Also endorses n/v. Has received cortisone shots without problem in the past.
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
## 2019-12-02 NOTE — ED Notes (Addendum)
Pt stated she is leaving because she was done waiting

## 2019-12-03 ENCOUNTER — Telehealth: Payer: Self-pay

## 2019-12-03 NOTE — Telephone Encounter (Signed)
Please call pt with results from the [place] [facility], from [facility].  Pt had went to the ER due to a cortisone shot, that she had a reaction to, vomiting, sweats, and knee swollen.  They did an xray and urine on her, but she went home as the ER wait was long, she had already been there 5 hours.

## 2019-12-03 NOTE — Telephone Encounter (Signed)
Pt advised of recommendation with verbal understanding  

## 2019-12-03 NOTE — Telephone Encounter (Signed)
Knee xray was normal and so was urine.

## 2019-12-03 NOTE — Telephone Encounter (Signed)
Pt is requesting over view of labs from [place]. As she left due to the ER wait being too long what would you recommend?

## 2019-12-09 ENCOUNTER — Ambulatory Visit: Payer: Medicare Other | Admitting: Family Medicine

## 2019-12-09 ENCOUNTER — Telehealth: Payer: Self-pay

## 2019-12-09 NOTE — Telephone Encounter (Signed)
Please let me know if you can print out a list of DX, let me know so I can call the pt back

## 2019-12-09 NOTE — Telephone Encounter (Signed)
Problem list printed for pt and left up front pt notified

## 2019-12-11 ENCOUNTER — Telehealth: Payer: Self-pay

## 2019-12-11 NOTE — Telephone Encounter (Signed)
Patient stated you was going to send in a prescription for Ibuprofen 800 mg for her, but pharmacy doesn't have it.  PATIENT USES [facility] ON [address]

## 2019-12-14 MED ORDER — IBUPROFEN 800 MG PO TABS: 800.0000 mg | ORAL_TABLET | Freq: Three times a day (TID) | ORAL | 5 refills | Status: DC | PRN

## 2019-12-21 ENCOUNTER — Ambulatory Visit
Payer: Medicare Other | Attending: Physical Medicine and Rehabilitation | Admitting: Rehabilitative and Restorative Service Providers"

## 2019-12-23 ENCOUNTER — Ambulatory Visit (INDEPENDENT_AMBULATORY_CARE_PROVIDER_SITE_OTHER): Payer: Medicare Other | Admitting: Family Medicine

## 2019-12-23 ENCOUNTER — Other Ambulatory Visit: Payer: Self-pay

## 2019-12-23 ENCOUNTER — Encounter: Payer: Self-pay | Admitting: Family Medicine

## 2019-12-23 VITALS — BP 123/76 | HR 64 | Temp 97.1°F | Resp 16 | Ht 66.0 in | Wt 179.5 lb

## 2019-12-23 DIAGNOSIS — Z131 Encounter for screening for diabetes mellitus: Secondary | ICD-10-CM | POA: Diagnosis not present

## 2019-12-23 DIAGNOSIS — H6123 Impacted cerumen, bilateral: Secondary | ICD-10-CM

## 2019-12-23 DIAGNOSIS — E663 Overweight: Secondary | ICD-10-CM

## 2019-12-23 DIAGNOSIS — Z0001 Encounter for general adult medical examination with abnormal findings: Secondary | ICD-10-CM | POA: Diagnosis not present

## 2019-12-23 DIAGNOSIS — I10 Essential (primary) hypertension: Secondary | ICD-10-CM | POA: Diagnosis not present

## 2019-12-23 DIAGNOSIS — Z6828 Body mass index (BMI) 28.0-28.9, adult: Secondary | ICD-10-CM

## 2019-12-23 DIAGNOSIS — F419 Anxiety disorder, unspecified: Secondary | ICD-10-CM

## 2019-12-23 DIAGNOSIS — H9203 Otalgia, bilateral: Secondary | ICD-10-CM | POA: Insufficient documentation

## 2019-12-23 HISTORY — DX: Encounter for general adult medical examination with abnormal findings: Z00.01

## 2019-12-23 LAB — POCT GLYCOSYLATED HEMOGLOBIN (HGB A1C)
HbA1c POC (<> result, manual entry): 5.7 % (ref 4.0–5.6)
HbA1c, POC (controlled diabetic range): 5.7 % (ref 0.0–7.0)
HbA1c, POC (prediabetic range): 5.7 % (ref 5.7–6.4)
Hemoglobin A1C: 5.7 % — AB (ref 4.0–5.6)

## 2019-12-23 LAB — COMPLETE METABOLIC PANEL WITH GFR
AG Ratio: 1.4 (calc) (ref 1.0–2.5)
ALT: 13 U/L (ref 6–29)
AST: 18 U/L (ref 10–30)
Albumin: 4.8 g/dL (ref 3.6–5.1)
Alkaline phosphatase (APISO): 55 U/L (ref 31–125)
BUN: 14 mg/dL (ref 7–25)
CO2: 30 mmol/L (ref 20–32)
Calcium: 10.1 mg/dL (ref 8.6–10.2)
Chloride: 102 mmol/L (ref 98–110)
Creat: 0.8 mg/dL (ref 0.50–1.10)
GFR, Est African American: 109 mL/min/{1.73_m2} (ref >=60)
GFR, Est Non African American: 94 mL/min/{1.73_m2} (ref >=60)
Globulin: 3.4 g/dL (calc) (ref 1.9–3.7)
Glucose, Bld: 103 mg/dL — ABNORMAL HIGH (ref 65–99)
Potassium: 3.7 mmol/L (ref 3.5–5.3)
Sodium: 139 mmol/L (ref 135–146)
Total Bilirubin: 0.6 mg/dL (ref 0.2–1.2)
Total Protein: 8.2 g/dL — ABNORMAL HIGH (ref 6.1–8.1)

## 2019-12-23 LAB — CBC
HCT: 42.6 % (ref 35.0–45.0)
Hemoglobin: 14.7 g/dL (ref 11.7–15.5)
MCH: 31.3 pg (ref 27.0–33.0)
MCHC: 34.5 g/dL (ref 32.0–36.0)
MCV: 90.8 fL (ref 80.0–100.0)
MPV: 10.8 fL (ref 7.5–12.5)
Platelets: 279 10*3/uL (ref 140–400)
RBC: 4.69 10*6/uL (ref 3.80–5.10)
RDW: 12.8 % (ref 11.0–15.0)
WBC: 4.4 10*3/uL (ref 3.8–10.8)

## 2019-12-23 LAB — LIPID PANEL
Cholesterol: 256 mg/dL — ABNORMAL HIGH (ref <200)
HDL: 72 mg/dL (ref >=50)
LDL Cholesterol (Calc): 169 mg/dL (calc) — ABNORMAL HIGH
Non-HDL Cholesterol (Calc): 184 mg/dL (calc) — ABNORMAL HIGH (ref <130)
Total CHOL/HDL Ratio: 3.6 (calc) (ref <5.0)
Triglycerides: 62 mg/dL (ref <150)

## 2019-12-23 LAB — TSH: TSH: 0.57 mIU/L

## 2019-12-23 LAB — VITAMIN D 25 HYDROXY (VIT D DEFICIENCY, FRACTURES): Vit D, 25-Hydroxy: 32 ng/mL (ref 30–100)

## 2019-12-23 MED ORDER — DEBROX 6.5 % OT SOLN: 5.0000 [drp] | Freq: Two times a day (BID) | OTIC | 0 refills | Status: DC

## 2019-12-23 NOTE — Assessment & Plan Note (Signed)
A1c in office 5.7  Encouraged a well balanced diet and exercise to prevent diabetes

## 2019-12-23 NOTE — Assessment & Plan Note (Signed)
Veronica [name] is encouraged to maintain a well balanced diet that is low in salt. Controlled, continue current medication regimen.  Additionally, she is also reminded that exercise is beneficial for heart health and control of  Blood pressure. 30-60 minutes daily is recommended-walking was suggested.

## 2019-12-23 NOTE — Progress Notes (Signed)
Health Maintenance reviewed -   Immunization History  Administered Date(s) Administered  . Tdap 04/18/2015   Last Pap smear: 2021 Last mammogram: n/a Last colonoscopy: n/a  Last DEXA: n/a  Dentist: 2020 Ophtho: Glasses, needs an appt  Exercise: limited, but tries walking  Smoker: no  Alcohol Use: no   Other doctors caring for patient include:  Patient Care Team: Freddy Finner, NP as PCP - General (Family Medicine) Patrici Ranks, MD (Inactive) as Referring Physician (Pain Medicine) [name], MD as Consulting Physician (Orthopedic Surgery)  End of Life Discussion:  Patient does not have a living will and medical power of attorney  Subjective:   HPI  Veronica [name] is a 39 y.o. female who presents for annual wellness visit and follow-up on chronic medical conditions.  She has the following concerns: Concerns include elevated anxiety secondary to admits that her going on at home.  She denies having any SI or HI.  That time she has been very fearful secondary to her son getting into some trouble recently with the law.  She reports that other individuals are now being prosecuted so she is started to feel a bit safer at home.  She reports a little bit of feeling off balance and muffled hearing.  Denies having any recent infections or sinus issues.  She does report that her headaches are still occurring off and on.  Is on Elavil and does see neurology for this and will be following up with them about this soon.    Additionally she would like to report that she has stopped smoking marijuana and overall is feeling much better.  Outside of her anxiety being high.  She has had a change in her appetite not been able to eat very well.  She reports she knows that she could lose some weight but she just did not want to lose an unhealthy weight.  She denies having any changes in chewing or swallowing or dental problems.  She reports that she is not been sleeping well secondary to  the anxiety and stress but overall is able to get some rest.  Denies having any changes in bowel or bladder habits.  Denies having any blood in urine or stool.  Denies having any memory changes.  She denies having skin issues.  Review Of Systems  Review of Systems  Constitutional: Negative.   HENT: Negative.   Eyes: Negative.   Respiratory: Negative.   Cardiovascular: Negative.   Gastrointestinal: Negative.   Endocrine: Negative.   Genitourinary: Negative.   Musculoskeletal: Negative.   Skin: Negative.   Allergic/Immunologic: Negative.   Neurological: Positive for dizziness and headaches.  Hematological: Negative.   Psychiatric/Behavioral: Positive for sleep disturbance. The patient is nervous/anxious.   All other systems reviewed and are negative.   Objective:   PHYSICAL EXAM:  BP 123/76 (BP Location: Left Arm, Patient Position: Sitting, Cuff Size: Normal)   Pulse 64   Temp (!) 97.1 F (36.2 C) (Temporal)   Resp 16   Ht 5\' 6"  (1.676 m)   Wt 179 lb 8 oz (81.4 kg)   SpO2 98%   BMI 28.97 kg/m    Physical Exam Vitals and nursing note reviewed.  Constitutional:      Appearance: Normal appearance. She is well-developed, well-groomed and overweight.  HENT:     Head: Normocephalic.     Right Ear: Ear canal and external ear normal. There is impacted cerumen.     Left Ear: Ear canal and external  ear normal. There is impacted cerumen.     Nose: Nose normal.     Mouth/Throat:     Lips: Pink.     Mouth: Mucous membranes are moist.     Pharynx: Oropharynx is clear. Uvula midline.  Eyes:     General: Lids are normal.     Extraocular Movements: Extraocular movements intact.     Conjunctiva/sclera: Conjunctivae normal.     Pupils: Pupils are equal, round, and reactive to light.  Neck:     Thyroid: No thyroid mass, thyromegaly or thyroid tenderness.  Cardiovascular:     Rate and Rhythm: Normal rate and regular rhythm.     Pulses: Normal pulses.          Radial pulses are  2+ on the right side and 2+ on the left side.       Dorsalis pedis pulses are 2+ on the right side and 2+ on the left side.       Posterior tibial pulses are 2+ on the right side and 2+ on the left side.     Heart sounds: Normal heart sounds.  Pulmonary:     Effort: Pulmonary effort is normal.     Breath sounds: Normal breath sounds and air entry.  Abdominal:     General: Bowel sounds are normal.     Palpations: Abdomen is soft.     Tenderness: There is no abdominal tenderness. There is no right CVA tenderness or left CVA tenderness.     Hernia: No hernia is present.  Musculoskeletal:        General: Normal range of motion.     Cervical back: Full passive range of motion without pain, normal range of motion and neck supple.     Right lower leg: No edema.     Left lower leg: No edema.     Comments: MAE, ROM intact   Feet:     Right foot:     Skin integrity: Skin integrity normal.     Toenail Condition: Right toenails are normal.     Left foot:     Skin integrity: Skin integrity normal.     Toenail Condition: Left toenails are normal.  Lymphadenopathy:     Cervical: No cervical adenopathy.  Skin:    General: Skin is warm and dry.     Capillary Refill: Capillary refill takes less than 2 seconds.  Neurological:     General: No focal deficit present.     Mental Status: She is alert and oriented to person, place, and time. Mental status is at baseline.     Cranial Nerves: Cranial nerves are intact.     Sensory: Sensation is intact.     Motor: Motor function is intact.     Coordination: Coordination is intact.     Gait: Gait is intact.     Deep Tendon Reflexes: Reflexes are normal and symmetric.  Psychiatric:        Attention and Perception: Attention and perception normal.        Mood and Affect: Affect normal. Mood is anxious and depressed.        Speech: Speech normal.        Behavior: Behavior normal. Behavior is cooperative.        Thought Content: Thought content normal.         Cognition and Memory: Cognition and memory normal.        Judgment: Judgment normal.     Comments: Overall good communication, sad and  anxious  Good eye contact     Bilateral Ear Flushing/Irrigation: consent obtained, Shana CMA present. TM limited ability to see on Right, Left is pearly grey and not injected post flush. She tolerated the flushing well.  Depression Screening  Depression screen [facility] 2/9 12/23/2019 12/02/2019 08/25/2019 05/13/2019 02/04/2019  Decreased Interest 1 1 0 0 0  Down, Depressed, Hopeless 1 1 0 0 1  PHQ - 2 Score 2 2 0 0 1  Altered sleeping 3 2 - - -  Tired, decreased energy 3 2 - - -  Change in appetite 3 2 - - -  Feeling bad or failure about yourself  1 1 - - -  Trouble concentrating 3 1 - - -  Moving slowly or fidgety/restless 1 0 - - -  Suicidal thoughts 0 0 - - -  PHQ-9 Score 16 10 - - -  Difficult doing work/chores Somewhat difficult Very difficult - - -     Falls  Fall Risk  12/23/2019 12/02/2019 08/25/2019 05/13/2019 02/04/2019  Falls in the past year? 0 1 1 0 1  Number falls in past yr: 0 1 1 0 1  Injury with Fall? 0 0 1 0 1  Risk for fall due to : No Fall Risks - - - -  Follow up Falls evaluation completed - - - -    Assessment & Plan:   1. Annual visit for general adult medical examination with abnormal findings   2. Overweight with body mass index (BMI) of 28 to 28.9 in adult   3. Essential hypertension   4. Screening for diabetes mellitus   5. Anxiety   6. Bilateral impacted cerumen     Tests ordered Orders Placed This Encounter  Procedures  . CBC  . COMPLETE METABOLIC PANEL WITH GFR  . Lipid panel  . TSH  . VITAMIN D 25 Hydroxy (Vit-D Deficiency, Fractures)  . Ambulatory referral to [facility]  . POCT glycosylated hemoglobin (Hb A1C)    Plan: Please see assessment and plan per problem list above.   Meds ordered this encounter  Medications  . carbamide peroxide (DEBROX) 6.5 % OTIC solution    Sig: Place 5 drops into  both ears 2 (two) times daily. For 5-7 days    Dispense:  15 mL    Refill:  0    Order Specific Question:   Supervising Provider    Answer:   Syliva Overman E [[id number]]   I have personally reviewed: The patient's medical and social history Their use of alcohol, tobacco or illicit drugs Their current medications and supplements The patient's functional ability including ADLs,fall risks, home safety risks, cognitive, and hearing and visual impairment Diet and physical activities Evidence for depression or mood disorders  The patient's weight, height, BMI, and visual acuity have been recorded in the chart.  I have made referrals, counseling, and provided education to the patient based on review of the above and I have provided the patient with a written personalized care plan for preventive services.    Note: This dictation was prepared with Dragon dictation along with smaller phrase technology. Similar sounding words can be transcribed inadequately or may not be corrected upon review. Any transcriptional errors that result from this process are unintentional.      Freddy Finner, NP   12/23/2019

## 2019-12-23 NOTE — Assessment & Plan Note (Signed)
Improved She reports mostly improved due to lack of appetite, but trying to eat health and exercise as she can.  Wt Readings from Last 3 Encounters:  12/23/19 179 lb 8 oz (81.4 kg)  12/02/19 185 lb (83.9 kg)  12/01/19 188 lb 6 oz (85.4 kg)

## 2019-12-23 NOTE — Assessment & Plan Note (Signed)
Most likely situation related, referral to [facility] for therapy. She is on several medications and I would like to avoid another-perhaps an increase in one that she is on would help.  Therapy is number one as she reports needing someone to talk with. I have encouraged her to focus on her health and self right now.

## 2019-12-23 NOTE — Assessment & Plan Note (Signed)
Bilateral ear irrigation.  Inability to fully clear the right ear.  Prescribed Debrox.  Follow-up in 1 week to recheck.  Possible need for ENT if not improved.  Hopefully this will help her balance issues that she is been feeling.  Reviewed side effects, risks and benefits of medication.   Patient acknowledged agreement and understanding of the plan.

## 2019-12-23 NOTE — Patient Instructions (Addendum)
I appreciate the opportunity to provide you with care for your health and wellness. Today we discussed: overall health  Follow up: 3 months   Labs today-fasting  Referrals today for therapy for anxiety   Please read over the info I have attached to this. We are here for you if you need anything. I hope you are feeling better and more relaxed soon.  Don't forget to talk to your Dr about the migraine medication and possible adjust of the other meds if this balance continues to be an issue.  Please continue to practice social distancing to keep you, your family, and our community safe.  If you must go out, please wear a mask and practice good handwashing.  It was a pleasure to see you and I look forward to continuing to work together on your health and well-being. Please do not hesitate to call the office if you need care or have questions about your care.  Have a wonderful day and week. With Gratitude, Tereasa Coop, DNP, AGNP-BC   Managing Anxiety, Adult After being diagnosed with an anxiety disorder, you may be relieved to know why you have felt or behaved a certain way. You may also feel overwhelmed about the treatment ahead and what it will mean for your life. With care and support, you can manage this condition and recover from it. How to manage lifestyle changes Managing stress and anxiety  Stress is your body's reaction to life changes and events, both good and bad. Most stress will last just a few hours, but stress can be ongoing and can lead to more than just stress. Although stress can play a major role in anxiety, it is not the same as anxiety. Stress is usually caused by something external, such as a deadline, test, or competition. Stress normally passes after the triggering event has ended.  Anxiety is caused by something internal, such as imagining a terrible outcome or worrying that something will go wrong that will devastate you. Anxiety often does not go away even after  the triggering event is over, and it can become long-term (chronic) worry. It is important to understand the differences between stress and anxiety and to manage your stress effectively so that it does not lead to an anxious response. Talk with your health care provider or a counselor to learn more about reducing anxiety and stress. He or she may suggest tension reduction techniques, such as:  Music therapy. This can include creating or listening to music that you enjoy and that inspires you.  Mindfulness-based meditation. This involves being aware of your normal breaths while not trying to control your breathing. It can be done while sitting or walking.  Centering prayer. This involves focusing on a word, phrase, or sacred image that means something to you and brings you peace.  Deep breathing. To do this, expand your stomach and inhale slowly through your nose. Hold your breath for 3-5 seconds. Then exhale slowly, letting your stomach muscles relax.  Self-talk. This involves identifying thought patterns that lead to anxiety reactions and changing those patterns.  Muscle relaxation. This involves tensing muscles and then relaxing them. Choose a tension reduction technique that suits your lifestyle and personality. These techniques take time and practice. Set aside 5-15 minutes a day to do them. Therapists can offer counseling and training in these techniques. The training to help with anxiety may be covered by some insurance plans. Other things you can do to manage stress and anxiety include:  Keeping a  stress/anxiety diary. This can help you learn what triggers your reaction and then learn ways to manage your response.  Thinking about how you react to certain situations. You may not be able to control everything, but you can control your response.  Making time for activities that help you relax and not feeling guilty about spending your time in this way.  Visual imagery and yoga can help you  stay calm and relax.  Medicines Medicines can help ease symptoms. Medicines for anxiety include:  Anti-anxiety drugs.  Antidepressants. Medicines are often used as a primary treatment for anxiety disorder. Medicines will be prescribed by a health care provider. When used together, medicines, psychotherapy, and tension reduction techniques may be the most effective treatment. Relationships Relationships can play a big part in helping you recover. Try to spend more time connecting with trusted friends and family members. Consider going to couples counseling, taking family education classes, or going to family therapy. Therapy can help you and others better understand your condition. How to recognize changes in your anxiety Everyone responds differently to treatment for anxiety. Recovery from anxiety happens when symptoms decrease and stop interfering with your daily activities at home or work. This may mean that you will start to:  Have better concentration and focus. Worry will interfere less in your daily thinking.  Sleep better.  Be less irritable.  Have more energy.  Have improved memory. It is important to recognize when your condition is getting worse. Contact your health care provider if your symptoms interfere with home or work and you feel like your condition is not improving. Follow these instructions at home: Activity  Exercise. Most adults should do the following: ? Exercise for at least 150 minutes each week. The exercise should increase your heart rate and make you sweat (moderate-intensity exercise). ? Strengthening exercises at least twice a week.  Get the right amount and quality of sleep. Most adults need 7-9 hours of sleep each night. Lifestyle   Eat a healthy diet that includes plenty of vegetables, fruits, whole grains, low-fat dairy products, and lean protein. Do not eat a lot of foods that are high in solid fats, added sugars, or salt.  Make choices that  simplify your life.  Do not use any products that contain nicotine or tobacco, such as cigarettes, e-cigarettes, and chewing tobacco. If you need help quitting, ask your health care provider.  Avoid caffeine, alcohol, and certain over-the-counter cold medicines. These may make you feel worse. Ask your pharmacist which medicines to avoid. General instructions  Take over-the-counter and prescription medicines only as told by your health care provider.  Keep all follow-up visits as told by your health care provider. This is important. Where to find support You can get help and support from these sources:  Self-help groups.  Online and [employer].  A trusted spiritual leader.  Couples counseling.  Family education classes.  Family therapy. Where to find more information You may find that joining a support group helps you deal with your anxiety. The following sources can help you locate counselors or support groups near you:  Mental Health America: www.mentalhealthamerica.net  Anxiety and Depression Association of [place] (ADAA): ProgramCam.de  [employer] on Mental Illness (NAMI): www.nami.org Contact a health care provider if you:  Have a hard time staying focused or finishing daily tasks.  Spend many hours a day feeling worried about everyday life.  Become exhausted by worry.  Start to have headaches, feel tense, or have nausea.  Urinate  more than normal.  Have diarrhea. Get help right away if you have:  A racing heart and shortness of breath.  Thoughts of hurting yourself or others. If you ever feel like you may hurt yourself or others, or have thoughts about taking your own life, get help right away. You can go to your nearest emergency department or call:  Your local emergency services (911 in the [place]  A suicide crisis helpline, such as the National Suicide Prevention Lifeline at 743-349-6069. This is open 24 hours a  day. Summary  Taking steps to learn and use tension reduction techniques can help calm you and help prevent triggering an anxiety reaction.  When used together, medicines, psychotherapy, and tension reduction techniques may be the most effective treatment.  Family, friends, and partners can play a big part in helping you recover from an anxiety disorder. This information is not intended to replace advice given to you by your health care provider. Make sure you discuss any questions you have with your health care provider. Document Revised: 11/11/2018 Document Reviewed: 11/11/2018 Elsevier Patient Education  2020 [employer].   HEALTH MAINTENANCE RECOMMENDATIONS:  It is recommended that you get at least 30 minutes of aerobic exercise at least 5 days/week (for weight loss, you may need as much as 60-90 minutes). This can be any activity that gets your heart rate up. This can be divided in 10-15 minute intervals if needed, but try and build up your endurance at least once a week.  Weight bearing exercise is also recommended twice weekly.  Eat a healthy diet with lots of vegetables, fruits and fiber.  "Colorful" foods have a lot of vitamins (ie green vegetables, tomatoes, red peppers, etc).  Limit sweet tea, regular sodas and alcoholic beverages, all of which has a lot of calories and sugar.  Up to 1 alcoholic drink daily may be beneficial for women (unless trying to lose weight, watch sugars).  Drink a lot of water.  Calcium recommendations are 1200-1500 mg daily (1500 mg for postmenopausal women or women without ovaries), and vitamin D 1000 IU daily.  This should be obtained from diet and/or supplements (vitamins), and calcium should not be taken all at once, but in divided doses.  Monthly self breast exams and yearly mammograms for women over the age of 60 is recommended.  Sunscreen of at least SPF 30 should be used on all sun-exposed parts of the skin when outside between the hours of 10 am  and 4 pm (not just when at beach or pool, but even with exercise, golf, tennis, and yard work!)  Use a sunscreen that says "broad spectrum" so it covers both UVA and UVB rays, and make sure to reapply every 1-2 hours.  Remember to change the batteries in your smoke detectors when changing your clock times in the spring and fall.  Use your seat belt every time you are in a car, and please drive safely and not be distracted with cell phones and texting while driving.

## 2019-12-23 NOTE — Assessment & Plan Note (Signed)
Discussed monthly self breast exams and yearly mammograms starting at 40; at least 30 minutes of aerobic activity at least 5 days/week and weight-bearing exercise 2x/week; proper sunscreen use reviewed; healthy diet, including goals of calcium and vitamin D intake and alcohol recommendations (less than or equal to 1 drink/day) reviewed; regular seatbelt use; changing batteries in smoke detectors.  Immunization recommendations discussed.  Colonoscopy recommendations reviewed for future needs.

## 2019-12-30 ENCOUNTER — Ambulatory Visit (INDEPENDENT_AMBULATORY_CARE_PROVIDER_SITE_OTHER): Payer: Medicare Other | Admitting: Family Medicine

## 2019-12-30 ENCOUNTER — Encounter
Payer: Medicare Other | Attending: Physical Medicine and Rehabilitation | Admitting: Physical Medicine and Rehabilitation

## 2019-12-30 ENCOUNTER — Encounter: Payer: Self-pay | Admitting: Family Medicine

## 2019-12-30 ENCOUNTER — Other Ambulatory Visit: Payer: Self-pay

## 2019-12-30 VITALS — BP 118/78 | HR 64 | Temp 97.3°F | Resp 16 | Ht 66.0 in | Wt 186.0 lb

## 2019-12-30 DIAGNOSIS — M25469 Effusion, unspecified knee: Secondary | ICD-10-CM | POA: Insufficient documentation

## 2019-12-30 DIAGNOSIS — M7918 Myalgia, other site: Secondary | ICD-10-CM | POA: Insufficient documentation

## 2019-12-30 DIAGNOSIS — H9203 Otalgia, bilateral: Secondary | ICD-10-CM
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
## 2019-12-30 NOTE — Assessment & Plan Note (Signed)
Improved. Never used the Debrox, was cleaning at home. Encouraged not to put things in ears to clean. Balanced improved.

## 2019-12-30 NOTE — Patient Instructions (Signed)
I appreciate the opportunity to provide you with care for your health and wellness. Today we discussed: ear pain  Follow up: 03/24/2020 as scheduled  No labs or referrals today  Glad your ear is better!  Please continue to practice social distancing to keep you, your family, and our community safe.  If you must go out, please wear a mask and practice good handwashing.  It was a pleasure to see you and I look forward to continuing to work together on your health and well-being. Please do not hesitate to call the office if you need care or have questions about your care.  Have a wonderful day and week. With Gratitude, Tereasa Coop, DNP, AGNP-BC

## 2020-01-09 ENCOUNTER — Other Ambulatory Visit: Payer: Self-pay | Admitting: Physical Medicine and Rehabilitation

## 2020-02-02 ENCOUNTER — Ambulatory Visit: Payer: Medicare Other | Admitting: Orthopaedic Surgery

## 2020-02-11 ENCOUNTER — Ambulatory Visit: Payer: Medicare Other | Admitting: Orthopaedic Surgery

## 2020-03-24 ENCOUNTER — Other Ambulatory Visit: Payer: Self-pay

## 2020-03-24 ENCOUNTER — Telehealth (INDEPENDENT_AMBULATORY_CARE_PROVIDER_SITE_OTHER): Payer: Medicare Other | Admitting: Family Medicine

## 2020-03-24 ENCOUNTER — Encounter: Payer: Self-pay | Admitting: Family Medicine

## 2020-03-24 VITALS — BP 118/78 | HR 80 | Ht 66.0 in | Wt 186.0 lb

## 2020-03-24 DIAGNOSIS — G8929 Other chronic pain: Secondary | ICD-10-CM | POA: Diagnosis not present

## 2020-03-24 DIAGNOSIS — M25561 Pain in right knee: Secondary | ICD-10-CM | POA: Diagnosis not present

## 2020-03-24 DIAGNOSIS — M25562 Pain in left knee: Secondary | ICD-10-CM | POA: Diagnosis not present
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
## 2020-03-24 NOTE — Patient Instructions (Addendum)
I appreciate the opportunity to provide you with care for your health and wellness. Today we discussed: knee pain   Follow up: 6 months   No labs  Referrals today: Ortho   Please continue to practice social distancing to keep you, your family, and our community safe.  If you must go out, please wear a mask and practice good handwashing.  It was a pleasure to see you and I look forward to continuing to work together on your health and well-being. Please do not hesitate to call the office if you need care or have questions about your care.  Have a wonderful day and week. With Gratitude, Tereasa Coop, DNP, AGNP-BC

## 2020-03-24 NOTE — Assessment & Plan Note (Signed)
I have referred her in the past  01/2019 to Ortho Dr Hilda Lias saw her and did injections. She has trouble with the last injection and wants a referral to another provider. Is willing to travel to [facility] for this. Referral made today- this was a phone visit, with limited PE.

## 2020-03-29 ENCOUNTER — Telehealth: Payer: Self-pay

## 2020-03-29 ENCOUNTER — Ambulatory Visit (INDEPENDENT_AMBULATORY_CARE_PROVIDER_SITE_OTHER): Payer: Medicare Other | Admitting: Orthopaedic Surgery

## 2020-03-29 ENCOUNTER — Encounter: Payer: Self-pay | Admitting: Orthopaedic Surgery

## 2020-03-29 VITALS — Ht 67.0 in | Wt 194.4 lb

## 2020-03-29 DIAGNOSIS — M1712 Unilateral primary osteoarthritis, left knee: Secondary | ICD-10-CM

## 2020-03-29 DIAGNOSIS — M1711 Unilateral primary osteoarthritis, right knee: Secondary | ICD-10-CM | POA: Diagnosis not present

## 2020-03-29 DIAGNOSIS — M3500 Sicca syndrome, unspecified: Secondary | ICD-10-CM | POA: Insufficient documentation

## 2020-03-29 DIAGNOSIS — M069 Rheumatoid arthritis, unspecified: Secondary | ICD-10-CM | POA: Insufficient documentation

## 2020-03-29 MED ORDER — CELECOXIB 200 MG PO CAPS: 200.0000 mg | ORAL_CAPSULE | Freq: Two times a day (BID) | ORAL | 3 refills | Status: DC
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
## 2020-03-29 NOTE — Telephone Encounter (Signed)
Please submit for bil gel inj 

## 2020-03-30 NOTE — Telephone Encounter (Signed)
Submitted for VOB for Synvisc one-Bilateral knee  

## 2020-03-31 ENCOUNTER — Telehealth: Payer: Self-pay

## 2020-03-31 NOTE — Telephone Encounter (Signed)
Approved for Synvisc One- Bilateral knee Dr. Tressie Stalker and Bill $35 copay 20% OOP No prior auth required  [facility] to schedule @ next available

## 2020-03-31 NOTE — Telephone Encounter (Signed)
Called pt and scheduled Pt is aware of copay

## 2020-04-03 LAB — RHEUMATOID FACTOR: Rheumatoid fact SerPl-aCnc: <14 IU/mL (ref <14)

## 2020-04-03 LAB — URIC ACID: Uric Acid, Serum: 3.9 mg/dL (ref 2.5–7.0)

## 2020-04-03 LAB — SEDIMENTATION RATE: Sed Rate: 9 mm/h (ref 0–20)

## 2020-04-03 LAB — ANA: Anti Nuclear Antibody (ANA): NEGATIVE

## 2020-04-12 ENCOUNTER — Telehealth (HOSPITAL_COMMUNITY): Payer: Self-pay | Admitting: Physical Therapy

## 2020-04-12 ENCOUNTER — Other Ambulatory Visit: Payer: Self-pay

## 2020-04-12 ENCOUNTER — Ambulatory Visit (INDEPENDENT_AMBULATORY_CARE_PROVIDER_SITE_OTHER): Payer: Medicare Other | Admitting: Orthopaedic Surgery

## 2020-04-12 ENCOUNTER — Ambulatory Visit (HOSPITAL_COMMUNITY): Payer: Medicare Other | Admitting: Physical Therapy

## 2020-04-12 DIAGNOSIS — M1711 Unilateral primary osteoarthritis, right knee: Secondary | ICD-10-CM

## 2020-04-12 DIAGNOSIS — M1712 Unilateral primary osteoarthritis, left knee: Secondary | ICD-10-CM | POA: Diagnosis not present

## 2020-04-12 MED ORDER — HYLAN G-F 20 48 MG/6ML IX SOSY
48.0000 mg | PREFILLED_SYRINGE | INTRA_ARTICULAR | Status: AC | PRN
  Administered 2020-04-12: 48 mg via INTRA_ARTICULAR

## 2020-04-12 MED ORDER — LIDOCAINE HCL 1 % IJ SOLN
5.0000 mL | INTRAMUSCULAR | Status: AC | PRN
  Administered 2020-04-12: 5 mL

## 2020-04-12 NOTE — Telephone Encounter (Signed)
pt rescheduled because she has to have injections in her kness today

## 2020-04-12 NOTE — Progress Notes (Signed)
   Procedure Note  Patient: Veronica [name]             Date of Birth: 1981/01/16           MRN: [id number]             Visit Date: 04/12/2020  Procedures: Visit Diagnoses:  1. Primary osteoarthritis of left knee   2. Primary osteoarthritis of right knee     Large Joint Inj: bilateral knee on 04/12/2020 2:06 PM Indications: pain Details: 22 G needle  Arthrogram: No  Medications (Right): 5 mL lidocaine 1 %; 48 mg Hylan 48 MG/6ML Medications (Left): 5 mL lidocaine 1 %; 48 mg Hylan 48 MG/6ML Outcome: tolerated well, no immediate complications Patient was prepped and draped in the usual sterile fashion.

## 2020-04-27 ENCOUNTER — Ambulatory Visit: Payer: Medicare Other | Admitting: Family Medicine

## 2020-04-28 ENCOUNTER — Other Ambulatory Visit: Payer: Self-pay

## 2020-04-28 ENCOUNTER — Ambulatory Visit (HOSPITAL_COMMUNITY): Payer: Medicare Other | Attending: Orthopaedic Surgery | Admitting: Physical Therapy

## 2020-04-28 ENCOUNTER — Encounter (HOSPITAL_COMMUNITY): Payer: Self-pay | Admitting: Physical Therapy

## 2020-04-28 DIAGNOSIS — R29898 Other symptoms and signs involving the musculoskeletal system: Secondary | ICD-10-CM | POA: Insufficient documentation

## 2020-04-28 DIAGNOSIS — M25561 Pain in right knee: Secondary | ICD-10-CM | POA: Diagnosis present

## 2020-04-28 DIAGNOSIS — M25562 Pain in left knee: Secondary | ICD-10-CM | POA: Insufficient documentation

## 2020-04-28 DIAGNOSIS — R2689 Other abnormalities of gait and mobility: Secondary | ICD-10-CM | POA: Diagnosis present

## 2020-04-28 DIAGNOSIS — M6281 Muscle weakness (generalized): Secondary | ICD-10-CM | POA: Diagnosis present
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
## 2020-05-03 ENCOUNTER — Other Ambulatory Visit: Payer: Self-pay

## 2020-05-03 ENCOUNTER — Ambulatory Visit (HOSPITAL_COMMUNITY): Payer: Medicare Other | Admitting: Physical Therapy

## 2020-05-03 DIAGNOSIS — M25562 Pain in left knee: Secondary | ICD-10-CM

## 2020-05-03 DIAGNOSIS — M25561 Pain in right knee: Secondary | ICD-10-CM

## 2020-05-03 DIAGNOSIS — R2689 Other abnormalities of gait and mobility: Secondary | ICD-10-CM

## 2020-05-03 DIAGNOSIS — M6281 Muscle weakness (generalized): Secondary | ICD-10-CM

## 2020-05-03 DIAGNOSIS — R29898 Other symptoms and signs involving the musculoskeletal system: Secondary | ICD-10-CM
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
## 2020-05-06 ENCOUNTER — Ambulatory Visit (HOSPITAL_COMMUNITY): Payer: Medicare Other

## 2020-05-06 ENCOUNTER — Telehealth (HOSPITAL_COMMUNITY): Payer: Self-pay

## 2020-05-06 NOTE — Telephone Encounter (Signed)
No show, called and spoke to pt who stated she is at [facility] and didn't realize she was overbooked.  Reminded next apt date and time with contact number if needs to cancel or reschedule.  Veronica [name], Veronica [name] [phone]

## 2020-05-10 ENCOUNTER — Telehealth (HOSPITAL_COMMUNITY): Payer: Self-pay | Admitting: Physical Therapy

## 2020-05-10 ENCOUNTER — Ambulatory Visit (HOSPITAL_COMMUNITY): Payer: Medicare Other | Admitting: Physical Therapy

## 2020-05-10 NOTE — Telephone Encounter (Signed)
Pt did not show for appointment.  Called and spoke to patient who thought her appt was at 2:45 today not 2pm.  Pt made appt for tomorrow instead at 3:30 as we had no availability today at 2:45.  Lurena Nida, PTA/CLT [phone]

## 2020-05-11 ENCOUNTER — Other Ambulatory Visit: Payer: Self-pay

## 2020-05-11 ENCOUNTER — Encounter (HOSPITAL_COMMUNITY): Payer: Self-pay

## 2020-05-11 ENCOUNTER — Ambulatory Visit (HOSPITAL_COMMUNITY): Payer: Medicare Other

## 2020-05-11 VITALS — BP 128/79 | HR 69

## 2020-05-11 DIAGNOSIS — M25562 Pain in left knee: Secondary | ICD-10-CM

## 2020-05-11 DIAGNOSIS — M25561 Pain in right knee: Secondary | ICD-10-CM

## 2020-05-11 DIAGNOSIS — M6281 Muscle weakness (generalized): Secondary | ICD-10-CM

## 2020-05-11 DIAGNOSIS — R2689 Other abnormalities of gait and mobility: Secondary | ICD-10-CM

## 2020-05-11 DIAGNOSIS — R29898 Other symptoms and signs involving the musculoskeletal system: Secondary | ICD-10-CM

## 2020-05-11 NOTE — Patient Instructions (Signed)
Bridge    Lie back, legs bent. Inhale, pressing hips up. Keeping ribs in, lengthen lower back. Exhale, rolling down along spine from top. Repeat 10 times. Do 2 sessions per day.  http://pm.exer.us/55   Copyright  VHI. All rights reserved.   Toe / Heel Raise (Standing)    Standing with support, raise heels, then rock back on heels and raise toes. Repeat 15 times.  Copyright  VHI. All rights reserved.
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
## 2020-05-12 ENCOUNTER — Ambulatory Visit (HOSPITAL_COMMUNITY): Payer: Medicare Other | Admitting: Physical Therapy

## 2020-05-12 ENCOUNTER — Telehealth (HOSPITAL_COMMUNITY): Payer: Self-pay | Admitting: Physical Therapy

## 2020-05-12 NOTE — Telephone Encounter (Signed)
pt cancelled appt for today because her daughter was rushed to the hospital

## 2020-05-17 ENCOUNTER — Emergency Department (HOSPITAL_COMMUNITY)
Admission: EM | Admit: 2020-05-17 | Discharge: 2020-05-17 | Disposition: A | Discharge status: Home / Self Care | Payer: Medicare Other | Attending: Emergency Medicine | Admitting: Emergency Medicine

## 2020-05-17 ENCOUNTER — Encounter (HOSPITAL_COMMUNITY): Payer: Self-pay | Admitting: Emergency Medicine

## 2020-05-17 ENCOUNTER — Ambulatory Visit (HOSPITAL_COMMUNITY): Payer: Medicare Other | Admitting: Physical Therapy

## 2020-05-17 ENCOUNTER — Emergency Department (HOSPITAL_COMMUNITY): Payer: Medicare Other

## 2020-05-17 ENCOUNTER — Telehealth (HOSPITAL_COMMUNITY): Payer: Self-pay | Admitting: Physical Therapy

## 2020-05-17 ENCOUNTER — Other Ambulatory Visit: Payer: Self-pay

## 2020-05-17 DIAGNOSIS — I10 Essential (primary) hypertension: Secondary | ICD-10-CM | POA: Diagnosis not present

## 2020-05-17 DIAGNOSIS — Z79899 Other long term (current) drug therapy: Secondary | ICD-10-CM | POA: Diagnosis not present

## 2020-05-17 DIAGNOSIS — M79672 Pain in left foot: Secondary | ICD-10-CM | POA: Insufficient documentation
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
## 2020-05-17 NOTE — Telephone Encounter (Signed)
Called patient about missed appointment today. She says she forgot with everything else she had going on today. Reminder patient about next visits scheduled 05/23/20.   4:27 PM, 05/17/20 Georges Lynch PT DPT  Physical Therapist with [facility]  [phone]

## 2020-05-17 NOTE — ED Triage Notes (Signed)
Patient accidentally dropped a pot on her left foot this evening , reports pain with mild swelling at left foot .

## 2020-05-23 ENCOUNTER — Ambulatory Visit (HOSPITAL_COMMUNITY): Payer: Medicare Other | Admitting: Physical Therapy

## 2020-05-23 ENCOUNTER — Telehealth (HOSPITAL_COMMUNITY): Payer: Self-pay | Admitting: Physical Therapy

## 2020-05-23 NOTE — Telephone Encounter (Signed)
called to offer the pt an earlier appt time today. pt decided she just wants to reschedule this appt and be put on a wait list for friday

## 2020-05-25 ENCOUNTER — Ambulatory Visit (HOSPITAL_COMMUNITY): Payer: Medicare Other | Attending: Orthopaedic Surgery | Admitting: Physical Therapy

## 2020-05-25 ENCOUNTER — Telehealth (HOSPITAL_COMMUNITY): Payer: Self-pay | Admitting: Physical Therapy

## 2020-05-25 DIAGNOSIS — M25562 Pain in left knee: Secondary | ICD-10-CM | POA: Insufficient documentation

## 2020-05-25 DIAGNOSIS — M25561 Pain in right knee: Secondary | ICD-10-CM | POA: Insufficient documentation

## 2020-05-25 DIAGNOSIS — M6281 Muscle weakness (generalized): Secondary | ICD-10-CM | POA: Insufficient documentation

## 2020-05-25 NOTE — Telephone Encounter (Signed)
Pt did not show for appointment today.  Called and spoke to patient who stated she forgot her appointment as her dog is having puppies and she just woke up.  Reminded patient of next appointment.  Lurena Nida, PTA/CLT [phone]

## 2020-05-30 ENCOUNTER — Telehealth (HOSPITAL_COMMUNITY): Payer: Self-pay | Admitting: Physical Therapy

## 2020-05-30 ENCOUNTER — Ambulatory Visit (HOSPITAL_COMMUNITY): Payer: Medicare Other | Admitting: Physical Therapy

## 2020-05-30 NOTE — Telephone Encounter (Signed)
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

## 2020-06-01 ENCOUNTER — Other Ambulatory Visit: Payer: Self-pay

## 2020-06-01 ENCOUNTER — Ambulatory Visit (HOSPITAL_COMMUNITY): Payer: Medicare Other | Admitting: Physical Therapy

## 2020-06-01 DIAGNOSIS — M25562 Pain in left knee: Secondary | ICD-10-CM

## 2020-06-01 DIAGNOSIS — M25561 Pain in right knee: Secondary | ICD-10-CM | POA: Diagnosis present

## 2020-06-01 DIAGNOSIS — M6281 Muscle weakness (generalized): Secondary | ICD-10-CM
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
## 2020-06-06 ENCOUNTER — Ambulatory Visit (HOSPITAL_COMMUNITY): Payer: Medicare Other | Admitting: Physical Therapy

## 2020-06-08 ENCOUNTER — Other Ambulatory Visit: Payer: Self-pay

## 2020-06-08 ENCOUNTER — Ambulatory Visit (HOSPITAL_COMMUNITY): Payer: Medicare Other | Admitting: Physical Therapy

## 2020-06-08 ENCOUNTER — Telehealth (HOSPITAL_COMMUNITY): Payer: Self-pay | Admitting: Physical Therapy

## 2020-06-08 NOTE — Telephone Encounter (Signed)
Pt was in lobby waiting and suddenly left telling the receptionist her Fedex package had arrived and she needed to go get it.  Pt did not show last appointment 12/13 and today was her last scheduled appointment in her certification period.  Pt has only completed 4 visits in over a month due to NS and cancellations.  Called and left VM explaining she would now be discharged from therapy due to lack of progress due to compliance and is at end of cert period.  Explained she would need to return to referring provider for a new order to return to therapy.    Lurena Nida, PTA/CLT [phone]

## 2020-07-28 ENCOUNTER — Encounter (HOSPITAL_COMMUNITY): Payer: Self-pay | Admitting: Physical Therapy

## 2020-07-28 NOTE — Therapy (Signed)
[facility] [address] [place], [place], [address] Phone: [phone]   Fax:  [phone]  Patient Details  Name: Veronica [name] MRN: [id number] Date of Birth: 12/30/1980 Referring Provider:  No ref. provider found  Encounter Date: 07/28/2020   PHYSICAL THERAPY DISCHARGE SUMMARY  Visits from Start of Care: 4  Current functional level related to goals / functional outcomes: Unknown as patient has not returned. Pt had only completed 4 visits in over a month due to NS and cancellations.   Remaining deficits: Unknown as patient has not returned.    Education / Equipment: HEP  Plan: Patient agrees to discharge.  Patient goals were not met. Patient is being discharged due to not returning since the last visit.  ?????     2:54 PM, 07/28/20 Andrew S. Zaunegger PT, DPT Physical Therapist at [facility]   [facility] [address] [place], [place], [address] Phone: [phone]   Fax:  [phone] 

## 2020-09-21 ENCOUNTER — Ambulatory Visit: Payer: Medicare Other | Admitting: Nurse Practitioner

## 2020-09-29 ENCOUNTER — Ambulatory Visit (INDEPENDENT_AMBULATORY_CARE_PROVIDER_SITE_OTHER): Payer: Medicare Other | Admitting: Nurse Practitioner

## 2020-09-29 ENCOUNTER — Encounter: Payer: Self-pay | Admitting: Nurse Practitioner

## 2020-09-29 ENCOUNTER — Other Ambulatory Visit: Payer: Self-pay

## 2020-09-29 DIAGNOSIS — G4700 Insomnia, unspecified: Secondary | ICD-10-CM | POA: Diagnosis not present
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
## 2020-09-29 NOTE — Assessment & Plan Note (Addendum)
-  hydroxyzine hasn't been helping -Rx. Trazodone -multiple family stressors

## 2020-11-03 ENCOUNTER — Encounter: Payer: Self-pay | Admitting: Nurse Practitioner

## 2020-11-03 ENCOUNTER — Ambulatory Visit (INDEPENDENT_AMBULATORY_CARE_PROVIDER_SITE_OTHER): Payer: Medicare Other | Admitting: Nurse Practitioner

## 2020-11-03 ENCOUNTER — Other Ambulatory Visit: Payer: Self-pay

## 2020-11-03 VITALS — BP 105/72 | HR 84 | Temp 98.3°F | Resp 20 | Ht 66.0 in | Wt 199.0 lb

## 2020-11-03 DIAGNOSIS — Z139 Encounter for screening, unspecified: Secondary | ICD-10-CM

## 2020-11-03 DIAGNOSIS — Z0000 Encounter for general adult medical examination without abnormal findings: Secondary | ICD-10-CM | POA: Diagnosis not present

## 2020-11-03 DIAGNOSIS — G4700 Insomnia, unspecified: Secondary | ICD-10-CM | POA: Diagnosis not present

## 2020-11-03 MED ORDER — TRAZODONE HCL 50 MG PO TABS: 50.0000 mg | ORAL_TABLET | Freq: Every day | ORAL | 1 refills | Status: DC

## 2020-11-03 NOTE — Assessment & Plan Note (Signed)
-  refilled trazodone for insomnia
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
## 2020-11-03 NOTE — Patient Instructions (Signed)
Please have fasting labs drawn 2-3 days prior to your appointment so we can discuss the results during your office visit.  

## 2020-11-20 ENCOUNTER — Emergency Department (HOSPITAL_COMMUNITY)
Admission: EM | Admit: 2020-11-20 | Discharge: 2020-11-20 | Disposition: A | Discharge status: Home / Self Care | Payer: Medicare Other | Attending: Emergency Medicine | Admitting: Emergency Medicine

## 2020-11-20 ENCOUNTER — Encounter (HOSPITAL_COMMUNITY): Payer: Self-pay

## 2020-11-20 ENCOUNTER — Other Ambulatory Visit: Payer: Self-pay

## 2020-11-20 DIAGNOSIS — R519 Headache, unspecified: Secondary | ICD-10-CM | POA: Diagnosis present

## 2020-11-20 MED ORDER — METOCLOPRAMIDE HCL 5 MG/ML IJ SOLN
10.0000 mg | Freq: Once | INTRAMUSCULAR | Status: AC
  Administered 2020-11-20: 10 mg via INTRAVENOUS
  Filled 2020-11-20: qty 2

## 2020-11-20 MED ORDER — DIPHENHYDRAMINE HCL 50 MG/ML IJ SOLN
12.5000 mg | Freq: Once | INTRAMUSCULAR | Status: AC
  Administered 2020-11-20: 12.5 mg via INTRAVENOUS
  Filled 2020-11-20: qty 1

## 2020-11-20 MED ORDER — ONDANSETRON HCL 4 MG/2ML IJ SOLN
4.0000 mg | Freq: Once | INTRAMUSCULAR | Status: AC
  Administered 2020-11-20: 4 mg via INTRAVENOUS
  Filled 2020-11-20: qty 2

## 2020-11-20 MED ORDER — SODIUM CHLORIDE 0.9 % IV BOLUS
1000.0000 mL | Freq: Once | INTRAVENOUS | Status: AC
  Administered 2020-11-20: 1000 mL via INTRAVENOUS
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
## 2020-11-20 NOTE — Discharge Instructions (Signed)
Vital signs and exam are reassuring.  I recommend continuing with your home medication as prescribed.  If you develop a headache I recommend over-the-counter pain medications like ibuprofen and/or Tylenol every 6 hours as needed please follow dosing on the back of bottle.  May also try over-the-counter Excedrin.  I recommend decreasing your stress, staying hydrated, improving your sleep hygiene as this can decrease migraine frequencies.  Please follow-up with neurology for further evaluation.  Come back to the emergency department if you develop chest pain, shortness of breath, severe abdominal pain, uncontrolled nausea, vomiting, diarrhea.

## 2020-11-20 NOTE — ED Triage Notes (Signed)
Headache, hx of migraines, skin warm and dry Pain is over left eye, which is normal for her migraines, nauseous, 10/10 pain

## 2020-11-21 ENCOUNTER — Emergency Department (HOSPITAL_COMMUNITY)
Admission: EM | Admit: 2020-11-21 | Discharge: 2020-11-21 | Disposition: A | Discharge status: Home / Self Care | Payer: Medicare Other | Attending: Emergency Medicine | Admitting: Emergency Medicine

## 2020-11-21 ENCOUNTER — Other Ambulatory Visit: Payer: Self-pay

## 2020-11-21 ENCOUNTER — Encounter (HOSPITAL_COMMUNITY): Payer: Self-pay | Admitting: *Deleted

## 2020-11-21 DIAGNOSIS — M546 Pain in thoracic spine: Secondary | ICD-10-CM | POA: Diagnosis present

## 2020-11-21 DIAGNOSIS — R202 Paresthesia of skin: Secondary | ICD-10-CM | POA: Insufficient documentation

## 2020-11-21 DIAGNOSIS — I10 Essential (primary) hypertension: Secondary | ICD-10-CM | POA: Insufficient documentation

## 2020-11-21 DIAGNOSIS — Y92481 Parking lot as the place of occurrence of the external cause: Secondary | ICD-10-CM | POA: Diagnosis not present

## 2020-11-21 MED ORDER — KETOROLAC TROMETHAMINE 60 MG/2ML IM SOLN
30.0000 mg | Freq: Once | INTRAMUSCULAR | Status: AC
  Administered 2020-11-21: 30 mg via INTRAMUSCULAR
  Filled 2020-11-21: qty 2

## 2020-11-21 MED ORDER — CYCLOBENZAPRINE HCL 10 MG PO TABS: 10.0000 mg | ORAL_TABLET | Freq: Two times a day (BID) | ORAL | 0 refills | Status: DC | PRN
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
## 2020-11-27 ENCOUNTER — Emergency Department (HOSPITAL_COMMUNITY): Payer: Medicare Other

## 2020-11-27 ENCOUNTER — Encounter (HOSPITAL_COMMUNITY): Payer: Self-pay | Admitting: Emergency Medicine

## 2020-11-27 ENCOUNTER — Other Ambulatory Visit: Payer: Self-pay

## 2020-11-27 ENCOUNTER — Emergency Department (HOSPITAL_COMMUNITY)
Admission: EM | Admit: 2020-11-27 | Discharge: 2020-11-28 | Disposition: A | Discharge status: Home / Self Care | Payer: Medicare Other | Attending: Emergency Medicine | Admitting: Emergency Medicine

## 2020-11-27 DIAGNOSIS — Z28310 Unvaccinated for covid-19: Secondary | ICD-10-CM | POA: Diagnosis not present

## 2020-11-27 DIAGNOSIS — R350 Frequency of micturition: Secondary | ICD-10-CM | POA: Insufficient documentation

## 2020-11-27 DIAGNOSIS — I10 Essential (primary) hypertension: Secondary | ICD-10-CM | POA: Insufficient documentation

## 2020-11-27 DIAGNOSIS — M79605 Pain in left leg: Secondary | ICD-10-CM | POA: Insufficient documentation

## 2020-11-27 DIAGNOSIS — M542 Cervicalgia: Secondary | ICD-10-CM | POA: Insufficient documentation

## 2020-11-27 DIAGNOSIS — R110 Nausea: Secondary | ICD-10-CM | POA: Insufficient documentation

## 2020-11-27 DIAGNOSIS — U071 COVID-19: Secondary | ICD-10-CM | POA: Insufficient documentation

## 2020-11-27 DIAGNOSIS — R519 Headache, unspecified: Secondary | ICD-10-CM | POA: Diagnosis present

## 2020-11-27 DIAGNOSIS — R509 Fever, unspecified: Secondary | ICD-10-CM

## 2020-11-27 DIAGNOSIS — M25511 Pain in right shoulder: Secondary | ICD-10-CM | POA: Diagnosis not present

## 2020-11-27 DIAGNOSIS — M79604 Pain in right leg: Secondary | ICD-10-CM | POA: Diagnosis not present

## 2020-11-27 LAB — URINALYSIS, ROUTINE W REFLEX MICROSCOPIC
Bilirubin Urine: NEGATIVE
Glucose, UA: NEGATIVE mg/dL
Hgb urine dipstick: NEGATIVE
Ketones, ur: NEGATIVE mg/dL
Leukocytes,Ua: NEGATIVE
Nitrite: NEGATIVE
Protein, ur: NEGATIVE mg/dL
Specific Gravity, Urine: 1.006 (ref 1.005–1.030)
pH: 7 (ref 5.0–8.0)

## 2020-11-27 LAB — PREGNANCY, URINE: Preg Test, Ur: NEGATIVE

## 2020-11-27 MED ORDER — ACETAMINOPHEN 500 MG PO TABS
1000.0000 mg | ORAL_TABLET | Freq: Once | ORAL | Status: AC
  Administered 2020-11-27: 1000 mg via ORAL
  Filled 2020-11-27: qty 2
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
## 2020-11-28 ENCOUNTER — Emergency Department (HOSPITAL_COMMUNITY): Payer: Medicare Other

## 2020-11-28 ENCOUNTER — Encounter (HOSPITAL_COMMUNITY): Payer: Self-pay | Admitting: Student

## 2020-11-28 LAB — SARS CORONAVIRUS 2 (TAT 6-24 HRS): SARS Coronavirus 2: POSITIVE — AB

## 2020-11-28 MED ORDER — NAPROXEN 500 MG PO TABS: 500.0000 mg | ORAL_TABLET | Freq: Two times a day (BID) | ORAL | 0 refills | Status: DC | PRN

## 2020-11-28 MED ORDER — METHOCARBAMOL 500 MG PO TABS: 500.0000 mg | ORAL_TABLET | Freq: Three times a day (TID) | ORAL | 0 refills | Status: DC | PRN

## 2020-11-28 MED ORDER — KETOROLAC TROMETHAMINE 60 MG/2ML IM SOLN
30.0000 mg | Freq: Once | INTRAMUSCULAR | Status: AC
  Administered 2020-11-28: 30 mg via INTRAMUSCULAR
  Filled 2020-11-28: qty 2
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
## 2020-11-28 NOTE — ED Notes (Signed)
While tech was rounding on, Pt stated that her head was hurting.
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
## 2020-12-05 ENCOUNTER — Telehealth (INDEPENDENT_AMBULATORY_CARE_PROVIDER_SITE_OTHER): Payer: Medicare Other | Admitting: Nurse Practitioner

## 2020-12-05 ENCOUNTER — Other Ambulatory Visit: Payer: Self-pay

## 2020-12-05 ENCOUNTER — Encounter: Payer: Self-pay | Admitting: Nurse Practitioner

## 2020-12-05 DIAGNOSIS — U071 COVID-19: Secondary | ICD-10-CM | POA: Insufficient documentation
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
## 2020-12-05 NOTE — Assessment & Plan Note (Signed)
-  resolved; no symptoms today, and she is already back at work

## 2020-12-28 ENCOUNTER — Other Ambulatory Visit: Payer: Self-pay

## 2020-12-28 ENCOUNTER — Ambulatory Visit: Payer: Medicare Other
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
## 2021-01-03 ENCOUNTER — Encounter: Payer: Self-pay | Admitting: Nurse Practitioner

## 2021-01-03 ENCOUNTER — Other Ambulatory Visit: Payer: Self-pay

## 2021-01-03 ENCOUNTER — Ambulatory Visit (INDEPENDENT_AMBULATORY_CARE_PROVIDER_SITE_OTHER): Payer: Medicare Other | Admitting: Nurse Practitioner

## 2021-01-03 VITALS — BP 126/76 | HR 84 | Temp 98.8°F | Ht 66.0 in | Wt 194.0 lb

## 2021-01-03 DIAGNOSIS — Z139 Encounter for screening, unspecified: Secondary | ICD-10-CM | POA: Insufficient documentation

## 2021-01-03 DIAGNOSIS — Z0001 Encounter for general adult medical examination with abnormal findings: Secondary | ICD-10-CM | POA: Diagnosis not present

## 2021-01-03 DIAGNOSIS — I10 Essential (primary) hypertension: Secondary | ICD-10-CM

## 2021-01-03 NOTE — Assessment & Plan Note (Signed)
-  start mammograms at age 40 -screen for HCV

## 2021-01-03 NOTE — Assessment & Plan Note (Signed)
BP Readings from Last 3 Encounters:  01/03/21 126/76  11/28/20 111/70  11/21/20 (!) 129/91  -well controlled today

## 2021-01-03 NOTE — Patient Instructions (Signed)
Please have fasting labs drawn in the next 7 days. 

## 2021-01-03 NOTE — Assessment & Plan Note (Signed)
-  physical exam unremarkable -ordered routine labs
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
## 2021-02-21 ENCOUNTER — Telehealth: Payer: Self-pay | Admitting: Nurse Practitioner

## 2021-02-21 NOTE — Telephone Encounter (Signed)
  Left message for patient to call back and schedule Medicare Annual Wellness Visit (AWV) in office.   If unable to come into the office for AWV,  please offer to do virtually or by telephone.  No hx of AWV eligible for AWVI as of 12/24/2019  Please schedule at anytime with [facility]-Nurse Health Advisor.      40 Minutes appointment   Any questions, please call me at [phone]

## 2021-03-17 ENCOUNTER — Ambulatory Visit (INDEPENDENT_AMBULATORY_CARE_PROVIDER_SITE_OTHER): Payer: Medicare Other

## 2021-03-17 ENCOUNTER — Other Ambulatory Visit: Payer: Self-pay

## 2021-03-17 DIAGNOSIS — Z0000 Encounter for general adult medical examination without abnormal findings: Secondary | ICD-10-CM | POA: Diagnosis not present
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
## 2021-03-17 NOTE — Patient Instructions (Signed)
Health Maintenance, Female Adopting a healthy lifestyle and getting preventive care are important in promoting health and wellness. Ask your health care provider about: The right schedule for you to have regular tests and exams. Things you can do on your own to prevent diseases and keep yourself healthy. What should I know about diet, weight, and exercise? Eat a healthy diet  Eat a diet that includes plenty of vegetables, fruits, low-fat dairy products, and lean protein. Do not eat a lot of foods that are high in solid fats, added sugars, or sodium. Maintain a healthy weight Body mass index (BMI) is used to identify weight problems. It estimates body fat based on height and weight. Your health care provider can help determine your BMI and help you achieve or maintain a healthy weight. Get regular exercise Get regular exercise. This is one of the most important things you can do for your health. Most adults should: Exercise for at least 150 minutes each week. The exercise should increase your heart rate and make you sweat (moderate-intensity exercise). Do strengthening exercises at least twice a week. This is in addition to the moderate-intensity exercise. Spend less time sitting. Even light physical activity can be beneficial. Watch cholesterol and blood lipids Have your blood tested for lipids and cholesterol at 38 years of age, then have this test every 5 years. Have your cholesterol levels checked more often if: Your lipid or cholesterol levels are high. You are older than 38 years of age. You are at high risk for heart disease. What should I know about cancer screening? Depending on your health history and family history, you may need to have cancer screening at various ages. This may include screening for: Breast cancer. Cervical cancer. Colorectal cancer. Skin cancer. Lung cancer. What should I know about heart disease, diabetes, and high blood pressure? Blood pressure and heart  disease High blood pressure causes heart disease and increases the risk of stroke. This is more likely to develop in people who have high blood pressure readings, are of African descent, or are overweight. Have your blood pressure checked: Every 3-5 years if you are 18-39 years of age. Every year if you are 40 years old or older. Diabetes Have regular diabetes screenings. This checks your fasting blood sugar level. Have the screening done: Once every three years after age 40 if you are at a normal weight and have a low risk for diabetes. More often and at a younger age if you are overweight or have a high risk for diabetes. What should I know about preventing infection? Hepatitis B If you have a higher risk for hepatitis B, you should be screened for this virus. Talk with your health care provider to find out if you are at risk for hepatitis B infection. Hepatitis C Testing is recommended for: Everyone born from 1945 through 1965. Anyone with known risk factors for hepatitis C. Sexually transmitted infections (STIs) Get screened for STIs, including gonorrhea and chlamydia, if: You are sexually active and are younger than 38 years of age. You are older than 38 years of age and your health care provider tells you that you are at risk for this type of infection. Your sexual activity has changed since you were last screened, and you are at increased risk for chlamydia or gonorrhea. Ask your health care provider if you are at risk. Ask your health care provider about whether you are at high risk for HIV. Your health care provider may recommend a prescription medicine   to help prevent HIV infection. If you choose to take medicine to prevent HIV, you should first get tested for HIV. You should then be tested every 3 months for as long as you are taking the medicine. Pregnancy If you are about to stop having your period (premenopausal) and you may become pregnant, seek counseling before you get  pregnant. Take 400 to 800 micrograms (mcg) of folic acid every day if you become pregnant. Ask for birth control (contraception) if you want to prevent pregnancy. Osteoporosis and menopause Osteoporosis is a disease in which the bones lose minerals and strength with aging. This can result in bone fractures. If you are 65 years old or older, or if you are at risk for osteoporosis and fractures, ask your health care provider if you should: Be screened for bone loss. Take a calcium or vitamin D supplement to lower your risk of fractures. Be given hormone replacement therapy (HRT) to treat symptoms of menopause. Follow these instructions at home: Lifestyle Do not use any products that contain nicotine or tobacco, such as cigarettes, e-cigarettes, and chewing tobacco. If you need help quitting, ask your health care provider. Do not use street drugs. Do not share needles. Ask your health care provider for help if you need support or information about quitting drugs. Alcohol use Do not drink alcohol if: Your health care provider tells you not to drink. You are pregnant, may be pregnant, or are planning to become pregnant. If you drink alcohol: Limit how much you use to 0-1 drink a day. Limit intake if you are breastfeeding. Be aware of how much alcohol is in your drink. In the [place] one drink equals one 12 oz bottle of beer (355 mL), one 5 oz glass of wine (148 mL), or one 1 oz glass of hard liquor (44 mL). General instructions Schedule regular health, dental, and eye exams. Stay current with your vaccines. Tell your health care provider if: You often feel depressed. You have ever been abused or do not feel safe at home. Summary Adopting a healthy lifestyle and getting preventive care are important in promoting health and wellness. Follow your health care provider's instructions about healthy diet, exercising, and getting tested or screened for diseases. Follow your health care provider's  instructions on monitoring your cholesterol and blood pressure. This information is not intended to replace advice given to you by your health care provider. Make sure you discuss any questions you have with your health care provider. Document Revised: 08/19/2020 Document Reviewed: 06/04/2018 Elsevier Patient Education  2022 Elsevier Inc.  

## 2021-03-28 ENCOUNTER — Other Ambulatory Visit (HOSPITAL_COMMUNITY): Payer: Self-pay | Admitting: Neurology

## 2021-03-28 DIAGNOSIS — M6281 Muscle weakness (generalized): Secondary | ICD-10-CM | POA: Diagnosis not present

## 2021-03-28 DIAGNOSIS — M130 Polyarthritis, unspecified: Secondary | ICD-10-CM | POA: Diagnosis not present

## 2021-03-28 DIAGNOSIS — G5603 Carpal tunnel syndrome, bilateral upper limbs: Secondary | ICD-10-CM | POA: Diagnosis not present

## 2021-03-28 DIAGNOSIS — E559 Vitamin D deficiency, unspecified: Secondary | ICD-10-CM | POA: Diagnosis not present

## 2021-03-28 DIAGNOSIS — M546 Pain in thoracic spine: Secondary | ICD-10-CM

## 2021-03-28 DIAGNOSIS — M545 Low back pain, unspecified: Secondary | ICD-10-CM

## 2021-03-28 DIAGNOSIS — M25569 Pain in unspecified knee: Secondary | ICD-10-CM | POA: Diagnosis not present

## 2021-03-28 DIAGNOSIS — R600 Localized edema: Secondary | ICD-10-CM | POA: Diagnosis not present

## 2021-03-28 DIAGNOSIS — G4700 Insomnia, unspecified: Secondary | ICD-10-CM | POA: Diagnosis not present

## 2021-03-28 DIAGNOSIS — G5600 Carpal tunnel syndrome, unspecified upper limb: Secondary | ICD-10-CM | POA: Diagnosis not present

## 2021-03-28 DIAGNOSIS — M797 Fibromyalgia: Secondary | ICD-10-CM | POA: Diagnosis not present

## 2021-03-28 DIAGNOSIS — G894 Chronic pain syndrome: Secondary | ICD-10-CM | POA: Diagnosis not present

## 2021-03-29 ENCOUNTER — Telehealth: Payer: Self-pay

## 2021-03-29 NOTE — Telephone Encounter (Signed)
   Telephone encounter was:  Successful.  03/29/2021 Name: NICOLA HEINEMANN MRN: [id number] DOB: 1981/02/19  Dallie Dad Wyeth is a 40 y.o. year old female who is a primary care patient of Heather Roberts, NP . The community resource team was consulted for assistance with Financial Difficulties related to light bill.  Care guide performed the following interventions:  Patient is needing assistance with [employer] energy bill (light bill). Resources will be sent by e-mail and a follow up call will take place as well. Resources are being researched at this time.  Follow Up Plan:  Care guide will follow up with patient by phone over the next few days.  [facility] Guide, Embedded Care Coordination [facility]  [place], [place] [address]  Main Phone: [phone]  E-mail: [email or web address]  Website: [email or web address]

## 2021-04-05 ENCOUNTER — Telehealth: Payer: Self-pay

## 2021-04-05 NOTE — Telephone Encounter (Signed)
   Telephone encounter was:  Successful.  04/05/2021 Name: Veronica [name] MRN: [id number] DOB: 06/17/1981  Veronica [name] is a 40 y.o. year old female who is a primary care patient of Heather Roberts, NP . The community resource team was consulted for assistance with Financial Difficulties related to light bill.  Care guide performed the following interventions:  Provided information: Crisis Intervention Program/Emergency assistance through [facility], [employer], and [employer]. Patient advised she has received e-mail and will contact me if there's any questions or concerns.  Follow Up Plan:  No further follow up planned at this time. The patient has been provided with needed resources. Patient will outreach if further assistance are needed.  [facility] Guide, Embedded Care Coordination [facility]  [place], [place] [address]  Main Phone: [phone]  E-mail: [email or web address]  Website: [email or web address]

## 2021-05-10 DIAGNOSIS — M25569 Pain in unspecified knee: Secondary | ICD-10-CM | POA: Diagnosis not present

## 2021-05-10 DIAGNOSIS — M797 Fibromyalgia: Secondary | ICD-10-CM | POA: Diagnosis not present

## 2021-05-10 DIAGNOSIS — M130 Polyarthritis, unspecified: Secondary | ICD-10-CM | POA: Diagnosis not present

## 2021-05-10 DIAGNOSIS — M542 Cervicalgia: Secondary | ICD-10-CM | POA: Diagnosis not present

## 2021-05-10 DIAGNOSIS — M6281 Muscle weakness (generalized): Secondary | ICD-10-CM | POA: Diagnosis not present

## 2021-05-10 DIAGNOSIS — M545 Low back pain, unspecified: Secondary | ICD-10-CM | POA: Diagnosis not present

## 2021-05-10 DIAGNOSIS — G5600 Carpal tunnel syndrome, unspecified upper limb: Secondary | ICD-10-CM | POA: Diagnosis not present

## 2021-05-10 DIAGNOSIS — G894 Chronic pain syndrome: Secondary | ICD-10-CM | POA: Diagnosis not present

## 2021-05-10 DIAGNOSIS — G5603 Carpal tunnel syndrome, bilateral upper limbs: Secondary | ICD-10-CM | POA: Diagnosis not present

## 2021-05-10 DIAGNOSIS — G4700 Insomnia, unspecified: Secondary | ICD-10-CM | POA: Diagnosis not present

## 2021-05-10 DIAGNOSIS — R600 Localized edema: Secondary | ICD-10-CM | POA: Diagnosis not present

## 2021-08-23 ENCOUNTER — Other Ambulatory Visit: Payer: Self-pay | Admitting: Adult Health

## 2021-08-23 ENCOUNTER — Other Ambulatory Visit: Payer: Self-pay

## 2021-08-23 ENCOUNTER — Other Ambulatory Visit (INDEPENDENT_AMBULATORY_CARE_PROVIDER_SITE_OTHER): Payer: Medicare Other | Admitting: *Deleted

## 2021-08-23 DIAGNOSIS — R319 Hematuria, unspecified: Secondary | ICD-10-CM | POA: Diagnosis not present

## 2021-08-23 LAB — POCT URINALYSIS DIPSTICK
Glucose, UA: NEGATIVE
Ketones, UA: NEGATIVE
Nitrite, UA: POSITIVE
Protein, UA: POSITIVE — AB

## 2021-08-23 MED ORDER — SULFAMETHOXAZOLE-TRIMETHOPRIM 800-160 MG PO TABS: 1.0000 | ORAL_TABLET | Freq: Two times a day (BID) | ORAL | 0 refills | Status: DC

## 2021-08-23 NOTE — Progress Notes (Signed)
Will rx septra ds for UTI, +nitrates  ?

## 2021-08-23 NOTE — Progress Notes (Signed)
? ?  NURSE VISIT- UTI SYMPTOMS  ? ?SUBJECTIVE:  ?Veronica [name] is a 41 y.o. [id number] female here for UTI symptoms. She is a GYN patient. She reports  blood in urine and pressure with urination X 4-5 days . ? ?OBJECTIVE:  ?LMP 08/04/2021   ?Appears well, in no apparent distress ? ?Results for orders placed or performed in visit on 08/23/21 (from the past 24 hour(s))  ?POCT Urinalysis Dipstick  ? Collection Time: 08/23/21  3:49 PM  ?Result Value Ref Range  ? Color, UA    ? Clarity, UA    ? Glucose, UA Negative Negative  ? Bilirubin, UA    ? Ketones, UA neg   ? Spec Grav, UA    ? Blood, UA 3+   ? pH, UA    ? Protein, UA Positive (A) Negative  ? Urobilinogen, UA    ? Nitrite, UA positive   ? Leukocytes, UA Moderate (2+) (A) Negative  ? Appearance    ? Odor    ? ? ?ASSESSMENT: ?GYN patient with UTI symptoms and positive nitrites ? ?PLAN: ?Discussed with Cyril Mourning, AGNP   ?Rx sent by provider today: Yes ?Urine culture sent ?Call or return to clinic prn if these symptoms worsen or fail to improve as anticipated. ?Follow-up: as needed  ? ?Malachy Mood  ?08/23/2021 ?3:55 PM ? ?

## 2021-08-24 LAB — MICROSCOPIC EXAMINATION
Casts: NONE SEEN /lpf
Epithelial Cells (non renal): >10 /hpf — AB (ref 0–10)
RBC, Urine: >30 /hpf — AB (ref 0–2)
WBC, UA: >30 /hpf — AB (ref 0–5)

## 2021-08-24 LAB — URINALYSIS, ROUTINE W REFLEX MICROSCOPIC
Bilirubin, UA: NEGATIVE
Glucose, UA: NEGATIVE
Ketones, UA: NEGATIVE
Nitrite, UA: POSITIVE — AB
Specific Gravity, UA: 1.023 (ref 1.005–1.030)
Urobilinogen, Ur: 0.2 mg/dL (ref 0.2–1.0)
pH, UA: 5.5 (ref 5.0–7.5)

## 2021-08-26 LAB — URINE CULTURE

## 2021-09-19 ENCOUNTER — Ambulatory Visit (INDEPENDENT_AMBULATORY_CARE_PROVIDER_SITE_OTHER): Payer: Medicare Other | Admitting: Adult Health

## 2021-09-19 ENCOUNTER — Other Ambulatory Visit: Payer: Self-pay

## 2021-09-19 ENCOUNTER — Encounter: Payer: Self-pay | Admitting: Adult Health

## 2021-09-19 VITALS — BP 134/69 | HR 78 | Ht 66.0 in | Wt 194.0 lb

## 2021-09-19 DIAGNOSIS — Z319 Encounter for procreative management, unspecified: Secondary | ICD-10-CM

## 2021-09-19 DIAGNOSIS — Z3202 Encounter for pregnancy test, result negative: Secondary | ICD-10-CM

## 2021-09-19 LAB — POCT URINE PREGNANCY: Preg Test, Ur: NEGATIVE

## 2021-09-19 NOTE — Progress Notes (Signed)
?  Subjective:  ?  ? Patient ID: Veronica [name], female   DOB: [birth year], 41 y.o.   MRN: [id number] ? ?HPI ?Veronica [name] is a 41 year old black female,single, [id number], in wanting to discuss getting pregnant. She has 2 grown children and her boyfriend does not have any, he is younger than she is. ? ?Lab Results  ?Component Value Date  ? DIAGPAP  09/11/2019  ?  - Negative for intraepithelial lesion or malignancy (NILM)  ? HPVHIGH Negative 09/11/2019  ?  ?Review of Systems ?Periods usually regular ?  Reviewed past medical,surgical, social and family history. Reviewed medications and allergies.  ?Objective:  ? Physical Exam ?BP 134/69 (BP Location: Right Arm, Patient Position: Sitting, Cuff Size: Normal)   Pulse 78   Ht 5\' 6"  (1.676 m)   Wt 194 lb (88 kg)   LMP 08/06/2021   BMI 31.31 kg/m?  UPT is negative.  ?  Skin warm and dry.  Lungs: clear to ausculation bilaterally. Cardiovascular: regular rate and rhythm.  ?Fall risk is low ? [facility] - 09/19/21 1628   ? ?  ? Pregnancy Intention Screening  ? Does the patient want to become pregnant in the next year? Yes   ? Does the patient's partner want to become pregnant in the next year? Yes   ? Would the patient like to discuss contraceptive options today? No   ?  ? Contraception Wrap Up  ? Current Method Pregnant/Seeking Pregnancy   ? End Method Pregnant/Seeking Pregnancy   ? ?  ?  ? ?  ?  ?Assessment:  ?   ?1. Pregnancy examination or test, negative result ? ?2. Patient desires pregnancy ?Take PNV ?Call when period starts will check progesterone level day 21 of cycle to see if ovulated ?Discussed may need to change Vraylar, when gets pregnant not a lot of data with it ?Stop smoking THC ?Discussed as she is older Downs risk ? ?   ?Plan:  ?  Call with period to check progesterone level day 21 ?Follow up prn  ?   ?

## 2021-10-02 ENCOUNTER — Ambulatory Visit
Admission: EM | Admit: 2021-10-02 | Discharge: 2021-10-02 | Disposition: A | Discharge status: Home / Self Care | Payer: Medicare Other

## 2021-10-02 DIAGNOSIS — M25511 Pain in right shoulder: Secondary | ICD-10-CM | POA: Diagnosis not present

## 2021-10-02 DIAGNOSIS — S161XXA Strain of muscle, fascia and tendon at neck level, initial encounter: Secondary | ICD-10-CM | POA: Diagnosis not present

## 2021-10-02 DIAGNOSIS — M25512 Pain in left shoulder: Secondary | ICD-10-CM

## 2021-10-02 DIAGNOSIS — M549 Dorsalgia, unspecified: Secondary | ICD-10-CM | POA: Diagnosis not present

## 2021-10-02 DIAGNOSIS — M542 Cervicalgia: Secondary | ICD-10-CM | POA: Diagnosis not present

## 2021-10-02 LAB — POCT URINE PREGNANCY: Preg Test, Ur: NEGATIVE

## 2021-10-02 MED ORDER — TIZANIDINE HCL 4 MG PO TABS: 4.0000 mg | ORAL_TABLET | Freq: Every day | ORAL | 0 refills | Status: DC

## 2021-10-02 MED ORDER — KETOROLAC TROMETHAMINE 60 MG/2ML IM SOLN
60.0000 mg | Freq: Once | INTRAMUSCULAR | Status: AC
  Administered 2021-10-02: 60 mg via INTRAMUSCULAR

## 2021-10-02 MED ORDER — NAPROXEN 500 MG PO TABS: 500.0000 mg | ORAL_TABLET | Freq: Two times a day (BID) | ORAL | 0 refills | Status: DC
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
## 2021-10-20 ENCOUNTER — Other Ambulatory Visit (HOSPITAL_COMMUNITY): Payer: Self-pay | Admitting: Neurology

## 2021-10-20 DIAGNOSIS — M545 Low back pain, unspecified: Secondary | ICD-10-CM

## 2021-10-20 DIAGNOSIS — M542 Cervicalgia: Secondary | ICD-10-CM

## 2021-10-20 DIAGNOSIS — M546 Pain in thoracic spine: Secondary | ICD-10-CM

## 2021-11-02 ENCOUNTER — Ambulatory Visit: Payer: Medicare Other | Admitting: Family Medicine

## 2021-11-03 ENCOUNTER — Encounter: Payer: Self-pay | Admitting: Family Medicine

## 2021-11-03 ENCOUNTER — Ambulatory Visit (INDEPENDENT_AMBULATORY_CARE_PROVIDER_SITE_OTHER): Payer: Medicare Other | Admitting: Family Medicine

## 2021-11-03 DIAGNOSIS — M25472 Effusion, left ankle: Secondary | ICD-10-CM

## 2021-11-03 DIAGNOSIS — Z1159 Encounter for screening for other viral diseases: Secondary | ICD-10-CM

## 2021-11-03 DIAGNOSIS — I10 Essential (primary) hypertension: Secondary | ICD-10-CM

## 2021-11-03 DIAGNOSIS — E559 Vitamin D deficiency, unspecified: Secondary | ICD-10-CM | POA: Diagnosis not present

## 2021-11-03 DIAGNOSIS — N912 Amenorrhea, unspecified: Secondary | ICD-10-CM

## 2021-11-03 DIAGNOSIS — R7301 Impaired fasting glucose: Secondary | ICD-10-CM | POA: Diagnosis not present
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
## 2021-11-03 NOTE — Patient Instructions (Addendum)
I appreciate the opportunity to provide care to you today!    Follow up:  3 months  Labs: please stop by the lab today to get your blood drawn (CBC, CMP, TSH, Lipid profile, HgA1c, Vit D)  Screening: Hep C     Please continue to a heart-healthy diet and increase your physical activities. Try to exercise for 30mins at least three times a week.      It was a pleasure to see you and I look forward to continuing to work together on your health and well-being. Please do not hesitate to call the office if you need care or have questions about your care.   Have a wonderful day and week. With Gratitude, Kasir Hallenbeck MSN, FNP-BC  

## 2021-11-04 DIAGNOSIS — N912 Amenorrhea, unspecified: Secondary | ICD-10-CM | POA: Insufficient documentation

## 2021-11-04 DIAGNOSIS — M25472 Effusion, left ankle: Secondary | ICD-10-CM | POA: Insufficient documentation

## 2021-11-04 NOTE — Assessment & Plan Note (Addendum)
Recommend conservative managements to help decrease swelling in your left ankle.   ?-Reduce salt (sodium) in your diet -- Sodium, which is found in table salt and processed foods, can worsen edema. Reducing the amount of salt you consume can help to reduce edema.  ?-Compression stockings -- Leg edema can be prevented and treated with the use of compression stockings. Stockings are available in several heights, including knee-high, thigh-high, and pantyhose. Knee-high stockings are sufficient for most patients. ?- Body positioning -- Leg, ankle, and foot edema can be improved by elevating the legs above heart level for 30 minutes three or four times per day. Elevating the legs may be sufficient to reduce or eliminate edema ?

## 2021-11-04 NOTE — Assessment & Plan Note (Signed)
-  Inability to conceive is likely do early menopausal changes, given that her mother had early menopause ?-encouraged to follow up with GYN for further evaluation if she does not resume her cycle by the end of this month ?-Pending labs( TSH) ?

## 2021-11-14 ENCOUNTER — Ambulatory Visit (INDEPENDENT_AMBULATORY_CARE_PROVIDER_SITE_OTHER): Payer: Medicare Other

## 2021-11-14 ENCOUNTER — Ambulatory Visit
Admission: EM | Admit: 2021-11-14 | Discharge: 2021-11-14 | Disposition: A | Discharge status: Home / Self Care | Payer: Medicare Other

## 2021-11-14 DIAGNOSIS — M5442 Lumbago with sciatica, left side: Secondary | ICD-10-CM | POA: Diagnosis not present

## 2021-11-14 DIAGNOSIS — S29011A Strain of muscle and tendon of front wall of thorax, initial encounter: Secondary | ICD-10-CM | POA: Diagnosis not present

## 2021-11-14 DIAGNOSIS — R079 Chest pain, unspecified: Secondary | ICD-10-CM

## 2021-11-14 DIAGNOSIS — R0602 Shortness of breath: Secondary | ICD-10-CM | POA: Diagnosis not present

## 2021-11-14 LAB — POCT URINE PREGNANCY: Preg Test, Ur: NEGATIVE

## 2021-11-14 MED ORDER — CYCLOBENZAPRINE HCL 10 MG PO TABS: 10.0000 mg | ORAL_TABLET | Freq: Two times a day (BID) | ORAL | 0 refills | Status: DC | PRN

## 2021-11-14 MED ORDER — KETOROLAC TROMETHAMINE 60 MG/2ML IM SOLN
60.0000 mg | Freq: Once | INTRAMUSCULAR | Status: AC
  Administered 2021-11-14: 60 mg via INTRAMUSCULAR
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
## 2021-11-14 NOTE — ED Triage Notes (Signed)
Pt reports right sided lower back pain and right sided rib cage pain x 2 days. States she hear a "pop" when working out. Pt has not taken any OTC meds for complaints.

## 2021-11-14 NOTE — Discharge Instructions (Addendum)
Your urine pregnancy test was negative today.  Your x-rays were negative for rib fracture. Recommend using ice or heat to the affected area.  Ice may be more helpful since you are having pain.  Apply for 20 minutes, remove for 1 hour, and repeat.  Use heat in the same pattern. Take medication as prescribed. Try to stay as active as possible.  Gentle stretching and active movement and range of motion will help with decrease her recovery time. Follow-up if you have worsening symptoms to include difficulty breathing, trouble breathing, loss of bowel or bladder function, lower extremity weakness, or inability to walk, if so, go to the emergency department.

## 2021-12-07 ENCOUNTER — Other Ambulatory Visit: Payer: Self-pay | Admitting: Family Medicine

## 2021-12-07 DIAGNOSIS — E559 Vitamin D deficiency, unspecified: Secondary | ICD-10-CM

## 2021-12-07 LAB — CMP14+EGFR
ALT: 17 IU/L (ref 0–32)
AST: 20 IU/L (ref 0–40)
Albumin/Globulin Ratio: 1.9 (ref 1.2–2.2)
Albumin: 3.9 g/dL (ref 3.8–4.8)
Alkaline Phosphatase: 52 IU/L (ref 44–121)
BUN/Creatinine Ratio: 15 (ref 9–23)
BUN: 12 mg/dL (ref 6–20)
Bilirubin Total: 0.2 mg/dL (ref 0.0–1.2)
CO2: 25 mmol/L (ref 20–29)
Calcium: 9.3 mg/dL (ref 8.7–10.2)
Chloride: 102 mmol/L (ref 96–106)
Creatinine, Ser: 0.81 mg/dL (ref 0.57–1.00)
Globulin, Total: 2.1 g/dL (ref 1.5–4.5)
Glucose: 107 mg/dL — ABNORMAL HIGH (ref 70–99)
Potassium: 3.9 mmol/L (ref 3.5–5.2)
Sodium: 138 mmol/L (ref 134–144)
Total Protein: 6 g/dL (ref 6.0–8.5)
eGFR: 95 mL/min/{1.73_m2} (ref >59)

## 2021-12-07 LAB — CBC WITH DIFFERENTIAL/PLATELET
Basophils Absolute: 0 10*3/uL (ref 0.0–0.2)
Basos: 1 %
EOS (ABSOLUTE): 0.1 10*3/uL (ref 0.0–0.4)
Eos: 1 %
Hematocrit: 48.3 % — ABNORMAL HIGH (ref 34.0–46.6)
Hemoglobin: 16.7 g/dL — ABNORMAL HIGH (ref 11.1–15.9)
Immature Grans (Abs): 0 10*3/uL (ref 0.0–0.1)
Immature Granulocytes: 1 %
Lymphocytes Absolute: 2.5 10*3/uL (ref 0.7–3.1)
Lymphs: 43 %
MCH: 31.6 pg (ref 26.6–33.0)
MCHC: 34.6 g/dL (ref 31.5–35.7)
MCV: 92 fL (ref 79–97)
Monocytes Absolute: 0.4 10*3/uL (ref 0.1–0.9)
Monocytes: 7 %
Neutrophils Absolute: 2.8 10*3/uL (ref 1.4–7.0)
Neutrophils: 47 %
Platelets: 298 10*3/uL (ref 150–450)
RBC: 5.28 x10E6/uL (ref 3.77–5.28)
RDW: 12.8 % (ref 11.7–15.4)
WBC: 5.8 10*3/uL (ref 3.4–10.8)

## 2021-12-07 LAB — TSH+FREE T4
Free T4: 1.19 ng/dL (ref 0.82–1.77)
TSH: 0.569 u[IU]/mL (ref 0.450–4.500)

## 2021-12-07 LAB — LIPID PANEL
Chol/HDL Ratio: 3.5 ratio (ref 0.0–4.4)
Cholesterol, Total: 220 mg/dL — ABNORMAL HIGH (ref 100–199)
HDL: 62 mg/dL (ref >39)
LDL Chol Calc (NIH): 141 mg/dL — ABNORMAL HIGH (ref 0–99)
Triglycerides: 97 mg/dL (ref 0–149)
VLDL Cholesterol Cal: 17 mg/dL (ref 5–40)

## 2021-12-07 LAB — HEMOGLOBIN A1C
Est. average glucose Bld gHb Est-mCnc: 114 mg/dL
Hgb A1c MFr Bld: 5.6 % (ref 4.8–5.6)

## 2021-12-07 LAB — VITAMIN D 25 HYDROXY (VIT D DEFICIENCY, FRACTURES): Vit D, 25-Hydroxy: 19 ng/mL — ABNORMAL LOW (ref 30.0–100.0)

## 2021-12-07 LAB — HEPATITIS C ANTIBODY: Hep C Virus Ab: NONREACTIVE

## 2021-12-07 NOTE — Progress Notes (Signed)
Correction, she DOES NOT have prediabetes. Her HgA1c is within normal limits

## 2021-12-07 NOTE — Progress Notes (Signed)
Please inform the patient that her cholesterol is elevated and her vit. D is low, and she has prediabetes.  I recommend low carbs, calories, and fat diet. I advise her to take Vit D 4000iu daily OTC.

## 2021-12-27 ENCOUNTER — Ambulatory Visit (INDEPENDENT_AMBULATORY_CARE_PROVIDER_SITE_OTHER): Payer: Medicare Other | Admitting: Family Medicine

## 2021-12-27 ENCOUNTER — Encounter: Payer: Self-pay | Admitting: Family Medicine

## 2021-12-27 DIAGNOSIS — M7661 Achilles tendinitis, right leg: Secondary | ICD-10-CM | POA: Diagnosis not present

## 2021-12-27 DIAGNOSIS — M7662 Achilles tendinitis, left leg: Secondary | ICD-10-CM | POA: Diagnosis not present

## 2021-12-27 DIAGNOSIS — M7660 Achilles tendinitis, unspecified leg: Secondary | ICD-10-CM | POA: Insufficient documentation

## 2021-12-27 DIAGNOSIS — M722 Plantar fascial fibromatosis: Secondary | ICD-10-CM | POA: Insufficient documentation

## 2021-12-27 NOTE — Assessment & Plan Note (Signed)
Assessment findings consistent with Achilles tendinitis Recommended Conservative treatments -Resting the injured area -Icing the injured area -Taking NSAIDs, such as ibuprofen, to help relieve pain and swelling -Using supportive shoes, wraps, heel lifts, or a walking boot
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
## 2021-12-27 NOTE — Patient Instructions (Signed)
I appreciate the opportunity to provide care to you today!   I recommend Conservative treatments -Resting the injured area -Icing the injured area -Taking NSAIDs, such as ibuprofen, to help relieve pain and swelling -Using supportive shoes, wraps, heel lifts, or a walking boot    Please continue to a heart-healthy diet and increase your physical activities. Try to exercise for at least three times a week.      It was a pleasure to see you and I look forward to continuing to work together on your health and well-being. Please do not hesitate to call the office if you need care or have questions about your care.   Have a wonderful day and week. With Gratitude, Gilmore Laroche MSN, FNP-BC

## 2022-01-15 ENCOUNTER — Ambulatory Visit (HOSPITAL_COMMUNITY)
Admission: RE | Admit: 2022-01-15 | Discharge: 2022-01-15 | Disposition: A | Discharge status: Home / Self Care | Payer: Medicare Other | Source: Ambulatory Visit | Attending: Neurology | Admitting: Neurology

## 2022-01-15 ENCOUNTER — Encounter (HOSPITAL_COMMUNITY): Payer: Self-pay | Admitting: Radiology

## 2022-01-15 DIAGNOSIS — M546 Pain in thoracic spine: Secondary | ICD-10-CM | POA: Diagnosis present

## 2022-01-15 DIAGNOSIS — M542 Cervicalgia: Secondary | ICD-10-CM | POA: Diagnosis present

## 2022-01-15 DIAGNOSIS — M545 Low back pain, unspecified: Secondary | ICD-10-CM | POA: Diagnosis present

## 2022-01-16 ENCOUNTER — Other Ambulatory Visit (HOSPITAL_COMMUNITY): Payer: Self-pay | Admitting: Neurology

## 2022-01-16 ENCOUNTER — Other Ambulatory Visit: Payer: Self-pay | Admitting: Neurology

## 2022-01-16 DIAGNOSIS — M5010 Cervical disc disorder with radiculopathy, unspecified cervical region: Secondary | ICD-10-CM

## 2022-01-31 ENCOUNTER — Ambulatory Visit (HOSPITAL_COMMUNITY)
Admission: RE | Admit: 2022-01-31 | Discharge: 2022-01-31 | Disposition: A | Discharge status: Home / Self Care | Payer: Medicare Other | Source: Ambulatory Visit | Attending: Neurology | Admitting: Neurology

## 2022-01-31 DIAGNOSIS — M5010 Cervical disc disorder with radiculopathy, unspecified cervical region: Secondary | ICD-10-CM

## 2022-02-05 ENCOUNTER — Encounter: Payer: Self-pay | Admitting: Family Medicine

## 2022-02-05 ENCOUNTER — Ambulatory Visit (INDEPENDENT_AMBULATORY_CARE_PROVIDER_SITE_OTHER): Payer: Medicare Other | Admitting: Family Medicine

## 2022-02-05 DIAGNOSIS — M5441 Lumbago with sciatica, right side: Secondary | ICD-10-CM | POA: Diagnosis not present

## 2022-02-05 DIAGNOSIS — G8929 Other chronic pain: Secondary | ICD-10-CM | POA: Diagnosis not present

## 2022-02-05 DIAGNOSIS — M5442 Lumbago with sciatica, left side: Secondary | ICD-10-CM

## 2022-02-05 NOTE — Assessment & Plan Note (Signed)
Reports following up with neurology and had an MRI of her cervical spine on 01/31/22 Imaging studies showed cervical spondylosis with expected cervical lordosis She reports following up with neurology next week about the next step--C/o of cervical and lumbar pain She reports taking baclofen, flexeril, naproxen, zanaflex with minimum relief Encouraged medication adherence and following up with neurology
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
## 2022-02-05 NOTE — Patient Instructions (Signed)
I appreciate the opportunity to provide care to you today!    Follow up: 4      Please continue to a heart-healthy diet and increase your physical activities. Try to exercise for at least three times a week.      It was a pleasure to see you and I look forward to continuing to work together on your health and well-being. Please do not hesitate to call the office if you need care or have questions about your care.   Have a wonderful day and week. With Gratitude, Gilmore Laroche MSN, FNP-BC

## 2022-03-20 ENCOUNTER — Ambulatory Visit (INDEPENDENT_AMBULATORY_CARE_PROVIDER_SITE_OTHER): Payer: Medicare Other

## 2022-03-20 DIAGNOSIS — Z0000 Encounter for general adult medical examination without abnormal findings: Secondary | ICD-10-CM | POA: Diagnosis not present
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
## 2022-03-20 NOTE — Patient Instructions (Signed)
  Veronica [name] , Thank you for taking time to come for your Medicare Wellness Visit. I appreciate your ongoing commitment to your health goals. Please review the following plan we discussed and let me know if I can assist you in the future.   These are the goals we discussed:  Goals      Patient Stated     None        This is a list of the screening recommended for you and due dates:  Health Maintenance  Topic Date Due   COVID-19 Vaccine (1) Never done   Flu Shot  Never done   Pap Smear  09/11/2022   Tetanus Vaccine  04/17/2025   Hepatitis C Screening: USPSTF Recommendation to screen - Ages 18-79 yo.  Completed   HIV Screening  Completed   HPV Vaccine  Aged Out

## 2022-06-08 ENCOUNTER — Ambulatory Visit: Payer: Medicare Other | Admitting: Family Medicine

## 2022-06-08 ENCOUNTER — Encounter: Payer: Self-pay | Admitting: Family Medicine

## 2022-07-04 ENCOUNTER — Encounter: Payer: Self-pay | Admitting: Adult Health

## 2022-07-04 ENCOUNTER — Ambulatory Visit (INDEPENDENT_AMBULATORY_CARE_PROVIDER_SITE_OTHER): Payer: Medicare Other | Admitting: Adult Health

## 2022-07-04 VITALS — BP 131/67 | HR 86 | Ht 66.0 in | Wt 190.0 lb

## 2022-07-04 DIAGNOSIS — N926 Irregular menstruation, unspecified: Secondary | ICD-10-CM | POA: Diagnosis not present

## 2022-07-04 DIAGNOSIS — Z319 Encounter for procreative management, unspecified: Secondary | ICD-10-CM

## 2022-07-04 DIAGNOSIS — R232 Flushing: Secondary | ICD-10-CM | POA: Diagnosis not present

## 2022-07-04 DIAGNOSIS — Z3202 Encounter for pregnancy test, result negative: Secondary | ICD-10-CM | POA: Diagnosis not present

## 2022-07-04 LAB — POCT URINE PREGNANCY: Preg Test, Ur: NEGATIVE

## 2022-07-04 NOTE — Progress Notes (Signed)
  Subjective:     Patient ID: Veronica [name], female   DOB: 1980/10/27, 42 y.o.   MRN: [id number]  HPI Veronica [name] is a 42 year old black female,with SO, [id number], in to discuss getting pregnant, her periods are irregular, had one in February 2023, and again 05/13/22, and has occasional hot flash. TSH and free T4 was normal in June 2023. Her partner is younger than she is.  Last pap was negative HPV and NILM 09/11/19.  PCP is Alvira Monday NP  Review of Systems Periods irregular, had 2 in 2023 Occasional  hot flash Reviewed past medical,surgical, social and family history. Reviewed medications and allergies.     Objective:   Physical Exam BP 131/67 (BP Location: Right Arm, Patient Position: Sitting, Cuff Size: Normal)   Pulse 86   Ht 5\' 6"  (1.676 m)   Wt 190 lb (86.2 kg)   LMP 05/13/2022 (Approximate)   BMI 30.67 kg/m  UPT is negative Skin warm and dry. Lungs: clear to ausculation bilaterally. Cardiovascular: regular rate and rhythm.    Fall risk is low  Upstream - 07/04/22 1119       Pregnancy Intention Screening   Does the patient want to become pregnant in the next year? Yes    Does the patient's partner want to become pregnant in the next year? Yes    Would the patient like to discuss contraceptive options today? No      Contraception Wrap Up   Current Method Pregnant/Seeking Pregnancy    End Method Pregnant/Seeking Pregnancy             Assessment:     1. Pregnancy examination or test, negative result  2. Irregular periods Only 2 periods in 2023 Will check [place]  3. Hot flashes Will check [place]  4. Patient desires pregnancy She is aware of increased risk of Downs Syndrome Will talk  when [facility] back    Plan:     Return in 2 months for pap only, get physical with PCP

## 2022-07-05 LAB — FOLLICLE STIMULATING HORMONE: FSH: 80.8 m[IU]/mL

## 2022-07-30 ENCOUNTER — Ambulatory Visit (INDEPENDENT_AMBULATORY_CARE_PROVIDER_SITE_OTHER): Payer: 59 | Admitting: Internal Medicine

## 2022-07-30 ENCOUNTER — Encounter: Payer: Self-pay | Admitting: Internal Medicine

## 2022-07-30 VITALS — BP 121/77 | HR 79 | Ht 66.0 in | Wt 197.0 lb

## 2022-07-30 DIAGNOSIS — R252 Cramp and spasm: Secondary | ICD-10-CM | POA: Diagnosis not present

## 2022-07-30 NOTE — Progress Notes (Unsigned)
   HPI:[name] is a 42 y.o. female who presents for evaluation of back pain and muscle cramps. She is under the care of [facility] spine specialist for cervical and lumbar radiculopathy.  Patient has had no change to her chronic pain.  We focused today's attention on her next concern which was muscle cramps.  She is getting random muscle cramps in her legs.  They seem to mostly affect her right hamstring. She has been more thirsty and urinating more frequently.   Physical Exam: Vitals:   07/30/22 1338  BP: 121/77  Pulse: 79  SpO2: 95%  Weight: 197 lb (89.4 kg)  Height: 5\' 6"  (1.676 m)     Physical Exam Constitutional:      Appearance: She is well-developed and well-groomed.  HENT:     Mouth/Throat:     Mouth: Mucous membranes are moist.  Eyes:     General: No scleral icterus.    Conjunctiva/sclera: Conjunctivae normal.  Cardiovascular:     Rate and Rhythm: Normal rate and regular rhythm.     Heart sounds: No murmur heard.    No friction rub. No gallop.  Pulmonary:     Effort: Pulmonary effort is normal.     Breath sounds: No wheezing, rhonchi or rales.  Musculoskeletal:     Right lower leg: No edema.     Left lower leg: No edema.  Skin:    General: Skin is warm and dry.      Assessment & Plan:   Muscle cramp Assessment/Plan: Acute uncomplicated illness - Hemoglobin A1C - BMP8+EGFR - Magnesium - CBC with Differential/Platelet - Fe+TIBC+Fer    Lorene Dy, MD

## 2022-07-30 NOTE — Patient Instructions (Addendum)
Thank you for trusting me with your care. To recap, today we discussed the following:   Chronic back pain and neck pain - Follow up with Spine specialist as planned  Muscle Cramps - We will check lab work today and I will follow up with results.  - Hemoglobin A1C - BMP8+EGFR - Magnesium - CBC with Differential/Platelet - Fe+TIBC+Fer

## 2022-07-31 ENCOUNTER — Other Ambulatory Visit: Payer: Self-pay

## 2022-07-31 DIAGNOSIS — R252 Cramp and spasm: Secondary | ICD-10-CM | POA: Insufficient documentation

## 2022-07-31 LAB — CBC WITH DIFFERENTIAL/PLATELET

## 2022-07-31 NOTE — Assessment & Plan Note (Signed)
Assessment/Plan: Acute uncomplicated illness - Hemoglobin A1C - BMP8+EGFR - Magnesium - CBC with Differential/Platelet - Fe+TIBC+Fer

## 2022-08-01 ENCOUNTER — Encounter: Payer: Self-pay | Admitting: Family Medicine

## 2022-08-01 ENCOUNTER — Telehealth (INDEPENDENT_AMBULATORY_CARE_PROVIDER_SITE_OTHER): Payer: 59 | Admitting: Family Medicine

## 2022-08-01 DIAGNOSIS — M6281 Muscle weakness (generalized): Secondary | ICD-10-CM

## 2022-08-01 DIAGNOSIS — R269 Unspecified abnormalities of gait and mobility: Secondary | ICD-10-CM | POA: Diagnosis not present

## 2022-08-01 LAB — CBC WITH DIFFERENTIAL/PLATELET
Basophils Absolute: 0 10*3/uL (ref 0.0–0.2)
Basos: 1 %
EOS (ABSOLUTE): 0.1 10*3/uL (ref 0.0–0.4)
Eos: 2 %
Hematocrit: 39.6 % (ref 34.0–46.6)
Hemoglobin: 13.1 g/dL (ref 11.1–15.9)
Immature Grans (Abs): 0 10*3/uL (ref 0.0–0.1)
Immature Granulocytes: 0 %
Lymphocytes Absolute: 2.2 10*3/uL (ref 0.7–3.1)
Lymphs: 44 %
MCH: 30.5 pg (ref 26.6–33.0)
MCHC: 33.1 g/dL (ref 31.5–35.7)
MCV: 92 fL (ref 79–97)
Monocytes Absolute: 0.5 10*3/uL (ref 0.1–0.9)
Monocytes: 10 %
Neutrophils Absolute: 2.1 10*3/uL (ref 1.4–7.0)
Neutrophils: 43 %
Platelets: 304 10*3/uL (ref 150–450)
RBC: 4.29 x10E6/uL (ref 3.77–5.28)
RDW: 12.1 % (ref 11.7–15.4)
WBC: 4.9 10*3/uL (ref 3.4–10.8)

## 2022-08-01 LAB — BMP8+EGFR
BUN/Creatinine Ratio: 12 (ref 9–23)
BUN: 9 mg/dL (ref 6–24)
CO2: 26 mmol/L (ref 20–29)
Calcium: 8.6 mg/dL — ABNORMAL LOW (ref 8.7–10.2)
Chloride: 104 mmol/L (ref 96–106)
Creatinine, Ser: 0.78 mg/dL (ref 0.57–1.00)
Glucose: 75 mg/dL (ref 70–99)
Potassium: 4 mmol/L (ref 3.5–5.2)
Sodium: 142 mmol/L (ref 134–144)
eGFR: 98 mL/min/{1.73_m2} (ref >59)

## 2022-08-01 LAB — IRON,TIBC AND FERRITIN PANEL
Ferritin: 21 ng/mL (ref 15–150)
Iron Saturation: 53 % (ref 15–55)
Iron: 154 ug/dL (ref 27–159)
Total Iron Binding Capacity: 288 ug/dL (ref 250–450)
UIBC: 134 ug/dL (ref 131–425)

## 2022-08-01 LAB — HEMOGLOBIN A1C
Est. average glucose Bld gHb Est-mCnc: 111 mg/dL
Hgb A1c MFr Bld: 5.5 % (ref 4.8–5.6)

## 2022-08-01 LAB — MAGNESIUM: Magnesium: 1.6 mg/dL (ref 1.6–2.3)

## 2022-08-01 NOTE — Progress Notes (Signed)
   Virtual Visit via Video Note  I connected with Veronica [name] on 08/01/22 at  3:00 PM EST by a video enabled telemedicine application and verified that I am speaking with the correct person using two identifiers.  Patient Location: Home Provider Location: Office/Clinic  I discussed the limitations, risks, security, and privacy concerns of performing an evaluation and management service by video and the availability of in person appointments. I also discussed with the patient that there may be a patient responsible charge related to this service. The patient expressed understanding and agreed to proceed.  Subjective: PCP: Veronica Monday, FNP  Chief Complaint  Patient presents with   Back Pain    Pt needs disabiltiy placard form completed for permanent renewal.    HPI The patient is seen today in need of disability form completed for permanent renewal.  ROS: Per HPI  Current Outpatient Medications:    cariprazine (VRAYLAR) 1.5 MG capsule, Vraylar 1.5 mg capsule  Take 1 capsule every day by oral route at bedtime., Disp: , Rfl:    Prenatal Vit-Fe Fumarate-FA (MULTIVITAMIN-PRENATAL) 27-0.8 MG TABS tablet, Take 1 tablet by mouth daily at 12 noon., Disp: , Rfl:    Vitamin D, Ergocalciferol, (DRISDOL) 1.25 MG (50000 UNIT) CAPS capsule, Take 50,000 Units by mouth every 7 (seven) days., Disp: , Rfl:   Observations/Objective: There were no vitals filed for this visit. Physical Exam Patient is well-developed, well-nourished in no acute distress.  Resting comfortably at home.  Head is normocephalic, atraumatic.  No labored breathing.  Speech is clear and coherent with logical content.  Patient is alert and oriented at baseline.   Assessment and Plan: Encouraged to pick up disability form today once completed  Follow Up Instructions: As needed   I discussed the assessment and treatment plan with the patient. The patient was provided an opportunity to ask questions, and all were  answered. The patient agreed with the plan and demonstrated an understanding of the instructions.   The patient was advised to call back or seek an in-person evaluation if the symptoms worsen or if the condition fails to improve as anticipated.  The above assessment and management plan was discussed with the patient. The patient verbalized understanding of and has agreed to the management plan.   Veronica Monday, FNP

## 2022-08-07 ENCOUNTER — Ambulatory Visit (INDEPENDENT_AMBULATORY_CARE_PROVIDER_SITE_OTHER): Payer: 59 | Admitting: Adult Health

## 2022-08-07 ENCOUNTER — Encounter: Payer: Self-pay | Admitting: Adult Health

## 2022-08-07 VITALS — BP 121/68 | HR 75 | Ht 66.0 in | Wt 199.0 lb

## 2022-08-07 DIAGNOSIS — T148XXA Other injury of unspecified body region, initial encounter: Secondary | ICD-10-CM | POA: Insufficient documentation

## 2022-08-07 DIAGNOSIS — N926 Irregular menstruation, unspecified: Secondary | ICD-10-CM | POA: Diagnosis not present

## 2022-08-07 DIAGNOSIS — Z3202 Encounter for pregnancy test, result negative: Secondary | ICD-10-CM

## 2022-08-07 DIAGNOSIS — Z319 Encounter for procreative management, unspecified: Secondary | ICD-10-CM | POA: Diagnosis not present

## 2022-08-07 DIAGNOSIS — R7989 Other specified abnormal findings of blood chemistry: Secondary | ICD-10-CM | POA: Diagnosis not present

## 2022-08-07 LAB — POCT URINE PREGNANCY: Preg Test, Ur: NEGATIVE

## 2022-08-07 NOTE — Progress Notes (Signed)
  Subjective:     Patient ID: Veronica [name], female   DOB: 03/15/1981, 42 y.o.   MRN: [id number]  HPI Veronica [name] is a 42 year old black female female with SO, [id number], in saying she missed period in December and January, had in February. Had [facility] 07/04/22 that was 80.8. she wants to get a baby. Has ?pulled muscle in chest. Last pap was negative HPV,NILM 09/11/19.  PCP is Alvira Monday NP  Review of Systems Missed period December and January Has ?pulled muscle in chest Reviewed past medical,surgical, social and family history. Reviewed medications and allergies.     Objective:   Physical Exam BP 121/68 (BP Location: Right Arm, Patient Position: Sitting, Cuff Size: Normal)   Pulse 75   Ht 5\' 6"  (1.676 m)   Wt 199 lb (90.3 kg)   LMP 07/31/2022 (Approximate)   BMI 32.12 kg/m  UPT is negative Skin warm and dry. Lungs: clear to ausculation bilaterally. Cardiovascular: regular rate and rhythm.      Upstream - 08/07/22 0902       Pregnancy Intention Screening   Does the patient want to become pregnant in the next year? Yes    Does the patient's partner want to become pregnant in the next year? Yes    Would the patient like to discuss contraceptive options today? No      Contraception Wrap Up   Current Method Pregnant/Seeking Pregnancy    End Method Pregnant/Seeking Pregnancy    Contraception Counseling Provided No             Assessment:      1. Pregnancy examination or test, negative result  2. Irregular periods Missed period December and January  3. Patient desires pregnancy Gave number to [facility]  4. High serum follicle stimulating hormone [facility]) 80.8 in January 2024, she is aware the is in postmenopausal range  5. Pulled muscle See PCP or Urgent Care    Plan:     Has pap and physical scheduled for 09/03/22

## 2022-08-10 DIAGNOSIS — M5416 Radiculopathy, lumbar region: Secondary | ICD-10-CM | POA: Insufficient documentation

## 2022-08-23 ENCOUNTER — Encounter: Payer: Self-pay | Admitting: Radiology

## 2022-09-03 ENCOUNTER — Ambulatory Visit: Payer: 59 | Admitting: Adult Health

## 2022-09-11 DIAGNOSIS — M7918 Myalgia, other site: Secondary | ICD-10-CM | POA: Insufficient documentation

## 2022-10-24 ENCOUNTER — Ambulatory Visit (INDEPENDENT_AMBULATORY_CARE_PROVIDER_SITE_OTHER): Payer: 59 | Admitting: Adult Health

## 2022-10-24 ENCOUNTER — Encounter: Payer: Self-pay | Admitting: Adult Health

## 2022-10-24 ENCOUNTER — Other Ambulatory Visit (HOSPITAL_COMMUNITY)
Admission: RE | Admit: 2022-10-24 | Discharge: 2022-10-24 | Disposition: A | Discharge status: Home / Self Care | Payer: 59 | Source: Ambulatory Visit | Attending: Adult Health | Admitting: Adult Health

## 2022-10-24 VITALS — BP 126/75 | HR 83 | Ht 66.0 in | Wt 202.5 lb

## 2022-10-24 DIAGNOSIS — R7989 Other specified abnormal findings of blood chemistry: Secondary | ICD-10-CM

## 2022-10-24 DIAGNOSIS — Z01419 Encounter for gynecological examination (general) (routine) without abnormal findings: Secondary | ICD-10-CM | POA: Insufficient documentation

## 2022-10-24 DIAGNOSIS — Z1151 Encounter for screening for human papillomavirus (HPV): Secondary | ICD-10-CM | POA: Diagnosis not present

## 2022-10-24 DIAGNOSIS — Z319 Encounter for procreative management, unspecified: Secondary | ICD-10-CM | POA: Diagnosis not present

## 2022-10-24 DIAGNOSIS — Z1211 Encounter for screening for malignant neoplasm of colon: Secondary | ICD-10-CM | POA: Diagnosis not present

## 2022-10-24 DIAGNOSIS — Z0000 Encounter for general adult medical examination without abnormal findings: Secondary | ICD-10-CM

## 2022-10-24 LAB — HEMOCCULT GUIAC POC 1CARD (OFFICE): Fecal Occult Blood, POC: NEGATIVE

## 2022-10-24 NOTE — Progress Notes (Signed)
Patient ID: Veronica [name], female   DOB: 1981-03-24, 42 y.o.   MRN: [id number] History of Present Illness: Veronica [name] is a 42 year old black female, with SO, [id number] in for a well woman gyn exam and pap. She says she is having regular periods now. She only had 2 last year, and FSH was 80.8 in January. She would still like to get pregnant. She has stopped smoking marijuana too.   PCP is Gilmore Laroche NP.    Current Medications, Allergies, Past Medical History, Past Surgical History, Family History and Social History were reviewed in [employer] record.     Review of Systems: Patient denies any headaches, hearing loss, fatigue, blurred vision, shortness of breath, chest pain, abdominal pain, problems with bowel movements, urination, or intercourse. No joint pain or mood swings.  Has pain left foot.   Physical Exam:BP 126/75 (BP Location: Left Arm, Patient Position: Sitting, Cuff Size: Large)   Pulse 83   Ht 5\' 6"  (1.676 m)   Wt 202 lb 8 oz (91.9 kg)   LMP 09/30/2022 (Approximate)   BMI 32.68 kg/m   General:  Well developed, well nourished, no acute distress Skin:  Warm and dry Neck:  Midline trachea, normal thyroid, good ROM, no lymphadenopathy Lungs; Clear to auscultation bilaterally Breast:  No dominant palpable mass, retraction, or nipple discharge Cardiovascular: Regular rate and rhythm Abdomen:  Soft, non tender, no hepatosplenomegaly Pelvic:  External genitalia is normal in appearance, no lesions.  The vagina is normal in appearance. Urethra has no lesions or masses. The cervix is bulbous and smooth, pap with GC/CHL and HR HPV genotyping performed. Uterus is felt to be normal size, shape, and contour.  No adnexal masses or tenderness noted.Bladder is non tender, no masses felt. Rectal: Good sphincter tone, no polyps, or hemorrhoids felt.  Hemoccult negative. Extremities/musculoskeletal:  No swelling or varicosities noted, no clubbing or cyanosis Psych:  No mood  changes, alert and cooperative,seems happy AA is 1 Fall risk is low    10/24/2022    1:53 PM 08/01/2022    2:01 PM 07/30/2022    1:39 PM  Depression screen PHQ 2/9  Decreased Interest 1 0 2  Down, Depressed, Hopeless 1 0 1  PHQ - 2 Score 2 0 3  Altered sleeping 0 0 1  Tired, decreased energy 1 0 1  Change in appetite 0 0 0  Feeling bad or failure about yourself  0 0 1  Trouble concentrating 0 0 2  Moving slowly or fidgety/restless 0 0 1  Suicidal thoughts 0 0 0  PHQ-9 Score 3 0 9  Difficult doing work/chores  Not difficult at all Somewhat difficult   She is on Vraylar    10/24/2022    1:53 PM 08/01/2022    2:01 PM 07/30/2022    1:40 PM 09/29/2020    4:01 PM  GAD 7 : Generalized Anxiety Score  Nervous, Anxious, on Edge 1 0 1 2  Control/stop worrying 2 0 3 2  Worry too much - different things 2 0 3 2  Trouble relaxing 1 0 3 3  Restless 1 0 2 3  Easily annoyed or irritable 2 0 3 3  Afraid - awful might happen 0 0 2 1  Total GAD 7 Score 9 0 17 16  Anxiety Difficulty  Not difficult at all Somewhat difficult Very difficult      Upstream - 10/24/22 1348       Pregnancy Intention Screening   Does  the patient want to become pregnant in the next year? Yes    Does the patient's partner want to become pregnant in the next year? Yes    Would the patient like to discuss contraceptive options today? No      Contraception Wrap Up   Current Method Pregnant/Seeking Pregnancy    End Method Pregnant/Seeking Pregnancy            Examination chaperoned by Malachy Mood LPN   Impression and plan: 1. Encounter for gynecological examination with Papanicolaou smear of cervix Pap was sent Pap in 3 years of normal Physical in 1 year Labs with PCP - Cytology - PAP( [facility])  2. Encounter for screening fecal occult blood testing Hemoccult was negative  - POCT occult blood stool  3. Patient desires pregnancy Periods regular now Will check FSH/LH day 3 of next cycle - FSH/LH  4. High  serum follicle stimulating hormone [facility]) Periods regular now. Discussed with Dr Charlotta Newton  [facility] was 80.8 07/04/22 Will check FSH/LH day 3 of next cycle  - FSH/LH

## 2022-10-25 LAB — FSH/LH
FSH: 39.3 m[IU]/mL
LH: 35.5 m[IU]/mL

## 2022-10-30 LAB — CYTOLOGY - PAP
Chlamydia: NEGATIVE
Comment: NEGATIVE
Comment: NEGATIVE
Comment: NORMAL
Diagnosis: NEGATIVE
High risk HPV: NEGATIVE
Neisseria Gonorrhea: NEGATIVE

## 2022-11-05 ENCOUNTER — Other Ambulatory Visit: Payer: Self-pay | Admitting: Adult Health

## 2022-11-05 DIAGNOSIS — R7989 Other specified abnormal findings of blood chemistry: Secondary | ICD-10-CM

## 2022-11-05 DIAGNOSIS — N926 Irregular menstruation, unspecified: Secondary | ICD-10-CM

## 2022-11-05 NOTE — Progress Notes (Signed)
Ck FSH 

## 2022-11-06 ENCOUNTER — Encounter: Payer: Self-pay | Admitting: Family Medicine

## 2022-11-06 ENCOUNTER — Ambulatory Visit (INDEPENDENT_AMBULATORY_CARE_PROVIDER_SITE_OTHER): Payer: 59 | Admitting: Family Medicine

## 2022-11-06 VITALS — BP 136/82 | HR 91 | Ht 66.0 in | Wt 201.1 lb

## 2022-11-06 DIAGNOSIS — M79671 Pain in right foot: Secondary | ICD-10-CM | POA: Diagnosis not present

## 2022-11-06 DIAGNOSIS — M722 Plantar fascial fibromatosis: Secondary | ICD-10-CM | POA: Diagnosis not present

## 2022-11-06 DIAGNOSIS — M79672 Pain in left foot: Secondary | ICD-10-CM | POA: Diagnosis not present

## 2022-11-06 MED ORDER — NAPROXEN 500 MG PO TABS: 500.0000 mg | ORAL_TABLET | Freq: Two times a day (BID) | ORAL | 0 refills | Status: AC

## 2022-11-06 NOTE — Assessment & Plan Note (Signed)
Will treat today with naproxen 500 mg twice daily for 2 weeks Education as follow: Rest and avoidance of exacerbating factors  -Cushioned footwear (Footwear should provide sufficient cushioning to reduce pressure on the heel. I  suggest  a prefabricated silicone heel insert) -Other Cushions are: cushion- or crepe-soled shoes with or without silicone heel inserts depending on what is most comfortable  -Other types of inserts (including custom orthotics)  Referral placed to orthopedic surgery
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
## 2022-11-06 NOTE — Patient Instructions (Addendum)
I appreciate the opportunity to provide care to you today!    Follow up:  3 months  Plantar Fasciitis -Rest and avoidance of exacerbating factors  -Cushioned footwear (Footwear should provide sufficient cushioning to reduce pressure on the heel. I  suggest  a prefabricated silicone heel insert) -Other Cushions are: cushion- or crepe-soled shoes with or without silicone heel inserts depending on what is most comfortable  -Other types of inserts (including custom orthotics)    Referrals today-  orthopedics surgery    Please continue to a heart-healthy diet and increase your physical activities. Try to exercise for at least five days a week.      It was a pleasure to see you and I look forward to continuing to work together on your health and well-being. Please do not hesitate to call the office if you need care or have questions about your care.   Have a wonderful day and week. With Gratitude, Gilmore Laroche MSN, FNP-BC

## 2022-11-08 LAB — FOLLICLE STIMULATING HORMONE: FSH: 60.9 m[IU]/mL

## 2022-11-09 ENCOUNTER — Telehealth: Payer: Self-pay | Admitting: *Deleted

## 2022-11-09 ENCOUNTER — Other Ambulatory Visit: Payer: Self-pay | Admitting: Adult Health

## 2022-11-09 DIAGNOSIS — N926 Irregular menstruation, unspecified: Secondary | ICD-10-CM

## 2022-11-09 DIAGNOSIS — R7989 Other specified abnormal findings of blood chemistry: Secondary | ICD-10-CM

## 2022-11-09 NOTE — Telephone Encounter (Signed)
Pt aware she needs an [place] in about 4 weeks. Pt voiced understanding and call was transferred to [facility] for appt. JSY

## 2022-11-09 NOTE — Telephone Encounter (Signed)
-----   Message from Jennifer A Griffin, NP sent at 11/09/2022  8:52 AM EDT ----- Get her an appt in office for US in about 4 weeks  and let her know the date and time. Thank you so much Jenn  

## 2022-11-09 NOTE — Telephone Encounter (Signed)
-----   Message from Adline Potter, NP sent at 11/09/2022  8:52 AM EDT ----- Get her an appt in office for [place] in about 4 weeks  and let her know the date and time. Thank you so much Jenn

## 2022-11-13 ENCOUNTER — Ambulatory Visit (INDEPENDENT_AMBULATORY_CARE_PROVIDER_SITE_OTHER): Payer: 59 | Admitting: Orthopedic Surgery

## 2022-11-13 ENCOUNTER — Other Ambulatory Visit (INDEPENDENT_AMBULATORY_CARE_PROVIDER_SITE_OTHER): Payer: 59

## 2022-11-13 ENCOUNTER — Encounter: Payer: Self-pay | Admitting: Orthopedic Surgery

## 2022-11-13 VITALS — BP 111/73 | HR 80 | Ht 66.0 in | Wt 220.0 lb

## 2022-11-13 DIAGNOSIS — M79672 Pain in left foot: Secondary | ICD-10-CM

## 2022-11-13 DIAGNOSIS — M722 Plantar fascial fibromatosis: Secondary | ICD-10-CM
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
## 2022-11-13 NOTE — Patient Instructions (Addendum)
Instructions Following Joint Injections  In clinic today, you received an injection in one of your joints (sometimes more than one).  Occasionally, you can have some pain at the injection site, this is normal.  You can place ice at the injection site, or take over-the-counter medications such as Tylenol (acetaminophen) or Advil (ibuprofen).  Please follow all directions listed on the bottle.  If your joint (knee or shoulder) becomes swollen, red or very painful, please contact the clinic for additional assistance.   Two medications were injected, including lidocaine and a steroid (often referred to as cortisone).  Lidocaine is effective almost immediately but wears off quickly.  However, the steroid can take a few days to improve your symptoms.  In some cases, it can make your pain worse for a couple of days.  Do not be concerned if this happens as it is common.  You can apply ice or take some over-the-counter medications as needed.     Plantar Fasciitis Rehab Ask your health care provider which exercises are safe for you. Do exercises exactly as told by your health care provider and adjust them as directed. It is normal to feel mild stretching, pulling, tightness, or discomfort as you do these exercises. Stop right away if you feel sudden pain or your pain gets worse. Do not begin these exercises until told by your health care provider.   Stretching and range-of-motion exercises These exercises warm up your muscles and joints and improve the movement and flexibility of your foot. These exercises also help to relieve pain.   Plantar fascia stretch  Sit with your left / right leg crossed over your opposite knee. Hold your heel with one hand with that thumb near your arch. With your other hand, hold your toes and gently pull them back toward the top of your foot. You should feel a stretch on the base (bottom) of your toes, or the bottom of your foot (plantar fascia), or both. Hold this stretch 10  seconds. Slowly release your toes and return to the starting position. Repeat 10 times. Complete this exercise 1-2 times a day.   Gastrocnemius stretch, standing This exercise is also called a calf (gastroc) stretch. It stretches the muscles in the back of the upper calf. Stand with your hands against a wall. Extend your left / right leg behind you, and bend your front knee slightly. Keeping your heels on the floor, your toes facing forward, and your back knee straight, shift your weight toward the wall. Do not arch your back. You should feel a gentle stretch in your upper calf. Hold this position for 10 seconds. Repeat 10 times. Complete this exercise 1-2 times a day.   Soleus stretch, standing This exercise is also called a calf (soleus) stretch. It stretches the muscles in the back of the lower calf. Stand with your hands against a wall. Extend your left / right leg behind you, and bend your front knee slightly. Keeping your heels on the floor and your toes facing forward, bend your back knee and shift your weight slightly over your back leg. You should feel a gentle stretch deep in your lower calf. Hold this position for 10 seconds. Repeat 10 times. Complete this exercise 1-2 times a day.   Gastroc and soleus stretch, standing step This exercise stretches the muscles in the back of the lower leg. These muscles are in the upper calf (gastrocnemius) and the lower calf (soleus). Stand with the ball of your left / right foot   on the front of a step. The ball of your foot is on the walking surface, right under your toes. Keep your other foot firmly on the same step. Hold on to the wall or a railing for balance. Slowly lift your other foot, allowing your body weight to press your heel down over the edge of the front of the step. Keep knee straight and unbent. You should feel a stretch in your calf. Hold this position for 10 seconds. Return both feet to the step. Repeat this exercise with  a slight bend in your left / right knee. Repeat 10 times with your left / right knee straight and 10 times with your left / right knee bent. Complete this exercise 1-2 times a day.   Balance exercise This exercise builds your balance and strength control of your arch to help take pressure off your plantar fascia.   Single leg stand If this exercise is too easy, you can try it with your eyes closed or while standing on a pillow. Without shoes, stand near a railing or in a doorway. You may hold on to the railing or door frame as needed. Stand on your left / right foot. Keep your big toe down on the floor and lift the arch of your foot. You should feel a stretch across the bottom of your foot and your arch. Do not let your foot roll inward. Hold this position for 10 seconds. Repeat 10 times. Complete this exercise 1-2 times a day. This information is not intended to replace advice given to you by your health care provider. Make sure you discuss any questions you have with your health care provider.    

## 2022-11-13 NOTE — Addendum Note (Signed)
Addended by: Baird Kay on: 11/13/2022 04:58 PM   Modules accepted: Orders

## 2022-12-18 ENCOUNTER — Ambulatory Visit (INDEPENDENT_AMBULATORY_CARE_PROVIDER_SITE_OTHER): Payer: 59

## 2022-12-18 DIAGNOSIS — R7989 Other specified abnormal findings of blood chemistry: Secondary | ICD-10-CM | POA: Diagnosis not present

## 2022-12-18 DIAGNOSIS — N926 Irregular menstruation, unspecified: Secondary | ICD-10-CM

## 2022-12-18 NOTE — Progress Notes (Signed)
PELVIC US TA/TV: heterogeneous anteverted uterus,multiple fibroids (#1) anterior right intramural 1.7 x 1.6 x 1.9 cm,(#2) posterior left subserosal fibroid 1.7 x 1.6 x 1.4 cm,EEC 5.5 mm,normal ovaries,ovaries appear mobile,no free fluid  Chaperone Whitney

## 2022-12-24 ENCOUNTER — Ambulatory Visit (HOSPITAL_COMMUNITY): Payer: 59 | Attending: Orthopedic Surgery | Admitting: Physical Therapy

## 2023-01-30 ENCOUNTER — Ambulatory Visit: Payer: 59 | Admitting: Obstetrics & Gynecology

## 2023-01-31 ENCOUNTER — Ambulatory Visit (HOSPITAL_COMMUNITY): Payer: 59 | Admitting: Physical Therapy

## 2023-02-05 ENCOUNTER — Ambulatory Visit (INDEPENDENT_AMBULATORY_CARE_PROVIDER_SITE_OTHER): Payer: 59 | Admitting: Obstetrics & Gynecology

## 2023-02-05 ENCOUNTER — Encounter: Payer: Self-pay | Admitting: Obstetrics & Gynecology

## 2023-02-05 VITALS — BP 118/69 | HR 67 | Ht 66.0 in | Wt 211.2 lb

## 2023-02-05 DIAGNOSIS — Z3009 Encounter for other general counseling and advice on contraception: Secondary | ICD-10-CM | POA: Diagnosis not present

## 2023-02-05 DIAGNOSIS — N970 Female infertility associated with anovulation: Secondary | ICD-10-CM | POA: Diagnosis not present
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
## 2023-02-06 ENCOUNTER — Ambulatory Visit: Payer: 59 | Admitting: Family Medicine

## 2023-03-19 ENCOUNTER — Ambulatory Visit (HOSPITAL_COMMUNITY): Payer: 59

## 2023-04-04 ENCOUNTER — Ambulatory Visit (HOSPITAL_COMMUNITY): Payer: 59 | Attending: Orthopedic Surgery

## 2023-04-22 ENCOUNTER — Emergency Department (HOSPITAL_COMMUNITY): Payer: 59

## 2023-04-22 ENCOUNTER — Encounter (HOSPITAL_COMMUNITY): Payer: Self-pay

## 2023-04-22 ENCOUNTER — Telehealth: Payer: 59 | Admitting: Physician Assistant

## 2023-04-22 ENCOUNTER — Emergency Department (HOSPITAL_COMMUNITY)
Admission: EM | Admit: 2023-04-22 | Discharge: 2023-04-22 | Disposition: A | Discharge status: Home / Self Care | Payer: 59 | Attending: Emergency Medicine | Admitting: Emergency Medicine

## 2023-04-22 ENCOUNTER — Other Ambulatory Visit: Payer: Self-pay

## 2023-04-22 DIAGNOSIS — R0789 Other chest pain: Secondary | ICD-10-CM | POA: Diagnosis present

## 2023-04-22 DIAGNOSIS — M62838 Other muscle spasm: Secondary | ICD-10-CM

## 2023-04-22 DIAGNOSIS — Z5321 Procedure and treatment not carried out due to patient leaving prior to being seen by health care provider: Secondary | ICD-10-CM | POA: Insufficient documentation

## 2023-04-22 LAB — BASIC METABOLIC PANEL
Anion gap: 8 (ref 5–15)
BUN: 15 mg/dL (ref 6–20)
CO2: 26 mmol/L (ref 22–32)
Calcium: 8.9 mg/dL (ref 8.9–10.3)
Chloride: 103 mmol/L (ref 98–111)
Creatinine, Ser: 0.72 mg/dL (ref 0.44–1.00)
GFR, Estimated: >60 mL/min (ref >60)
Glucose, Bld: 79 mg/dL (ref 70–99)
Potassium: 3.7 mmol/L (ref 3.5–5.1)
Sodium: 137 mmol/L (ref 135–145)

## 2023-04-22 LAB — CBC
HCT: 38.2 % (ref 36.0–46.0)
Hemoglobin: 13.1 g/dL (ref 12.0–15.0)
MCH: 30.5 pg (ref 26.0–34.0)
MCHC: 34.3 g/dL (ref 30.0–36.0)
MCV: 89 fL (ref 80.0–100.0)
Platelets: 286 10*3/uL (ref 150–400)
RBC: 4.29 MIL/uL (ref 3.87–5.11)
RDW: 12.3 % (ref 11.5–15.5)
WBC: 5.2 10*3/uL (ref 4.0–10.5)
nRBC: 0 % (ref 0.0–0.2)

## 2023-04-22 LAB — TROPONIN I (HIGH SENSITIVITY): Troponin I (High Sensitivity): <2 ng/L (ref <18)

## 2023-04-22 MED ORDER — LIDOCAINE 5 % EX PTCH
1.0000 | MEDICATED_PATCH | CUTANEOUS | Status: DC
  Administered 2023-04-22: 1 via TRANSDERMAL
  Filled 2023-04-22: qty 1

## 2023-04-22 MED ORDER — METHYLPREDNISOLONE 4 MG PO TBPK: ORAL_TABLET | ORAL | 0 refills | Status: DC

## 2023-04-22 MED ORDER — KETOROLAC TROMETHAMINE 15 MG/ML IJ SOLN
15.0000 mg | Freq: Once | INTRAMUSCULAR | Status: AC
  Administered 2023-04-22: 15 mg via INTRAMUSCULAR
  Filled 2023-04-22: qty 1

## 2023-04-22 MED ORDER — METHOCARBAMOL 750 MG PO TABS: 750.0000 mg | ORAL_TABLET | Freq: Four times a day (QID) | ORAL | 0 refills | Status: DC

## 2023-04-22 NOTE — ED Triage Notes (Signed)
C/o centralized chest pain described as tightness radiating into left shoulder/neck that wakes her from sleep.  Patient reports gradually worse over last 3 days.  Denies sob.  Ambulatory to triage.

## 2023-04-22 NOTE — Progress Notes (Signed)
Virtual Visit Consent   Veronica [name], you are scheduled for a virtual visit with a [facility] provider today. Just as with appointments in the office, your consent must be obtained to participate. Your consent will be active for this visit and any virtual visit you may have with one of our providers in the next 365 days. If you have a MyChart account, a copy of this consent can be sent to you electronically.  As this is a virtual visit, video technology does not allow for your provider to perform a traditional examination. This may limit your provider's ability to fully assess your condition. If your provider identifies any concerns that need to be evaluated in person or the need to arrange testing (such as labs, EKG, etc.), we will make arrangements to do so. Although advances in technology are sophisticated, we cannot ensure that it will always work on either your end or our end. If the connection with a video visit is poor, the visit may have to be switched to a telephone visit. With either a video or telephone visit, we are not always able to ensure that we have a secure connection.  By engaging in this virtual visit, you consent to the provision of healthcare and authorize for your insurance to be billed (if applicable) for the services provided during this visit. Depending on your insurance coverage, you may receive a charge related to this service.  I need to obtain your verbal consent now. Are you willing to proceed with your visit today? Veronica [name] has provided verbal consent on 04/22/2023 for a virtual visit (video or telephone). Margaretann Loveless, PA-C  Date: 04/22/2023 2:37 PM  Virtual Visit via Video Note   IMargaretann Loveless, connected with  Veronica [name]  ([id number], [birth year]) on 04/22/23 at  2:30 PM EDT by a video-enabled telemedicine application and verified that I am speaking with the correct person using two identifiers.  Location: Patient: Virtual Visit Location  Patient: Mobile Provider: Virtual Visit Location Provider: Home Office   I discussed the limitations of evaluation and management by telemedicine and the availability of in person appointments. The patient expressed understanding and agreed to proceed.    History of Present Illness: Veronica [name] is a 42 y.o. who identifies as a female who was assigned female at birth, and is being seen today for muscle spasms. Started 3 days ago. Was mostly on the left side and radiated from the left sided neck/shoulder into the left chest. Then became more chest centralized chest pain/tightness that radiated into the neck and shoulder area. She went to the ER today and had labs and a CXR but left before being seen. She shows spasm of the right upper trapezius in the video. She denies SOB, DOE, lightheadedness, weakness, nausea, vomiting.  She has taken Ibuprofen, Tizanidine 4mg  (once), and Cyclobenzaprine (once).    Problems:  Patient Active Problem List   Diagnosis Date Noted   Encounter for screening fecal occult blood testing 10/24/2022   Encounter for gynecological examination with Papanicolaou smear of cervix 10/24/2022   Myofascial pain 09/11/2022   Lumbar radiculopathy 08/10/2022   High serum follicle stimulating hormone (FSH) 08/07/2022   Pulled muscle 08/07/2022   Muscle cramp 07/31/2022   Hot flashes 07/04/2022   Irregular periods 07/04/2022   Plantar fasciitis of left foot 12/27/2021   Ankle swelling, left 11/04/2021   Amenorrhea 11/04/2021   Patient desires pregnancy 09/19/2021   Pregnancy examination or test, negative result  09/19/2021   Screening due 01/03/2021   Rheumatoid arthritis (HCC) 03/29/2020   Sjogren's syndrome (HCC) 03/29/2020   Chronic pain of left knee 03/24/2020   Encounter for general adult medical examination with abnormal findings 12/23/2019   Anxiety 12/23/2019   Overweight with body mass index (BMI) of 28 to 28.9 in adult 12/23/2019   Vitamin D deficiency 11/10/2019    Insomnia 09/15/2019   Abnormal gait due to muscle weakness 08/22/2019   Bipolar disorder (HCC) 08/22/2019   Primary fibromyalgia syndrome 08/22/2019   Essential hypertension 05/13/2019   Chronic low back pain 07/22/2014    Allergies:  Allergies  Allergen Reactions   Other Swelling    Strong antibiotic that was given in hosp-unsure of name   Doxycycline Nausea And Vomiting   Iodine Other (See Comments)    Burns skin   Medications:  Current Outpatient Medications:    methocarbamol (ROBAXIN) 750 MG tablet, Take 1 tablet (750 mg total) by mouth 4 (four) times daily., Disp: 30 tablet, Rfl: 0   methylPREDNISolone (MEDROL DOSEPAK) 4 MG TBPK tablet, 6 day taper; take as directed on package instructions, Disp: 21 tablet, Rfl: 0   cariprazine (VRAYLAR) 1.5 MG capsule, Vraylar 1.5 mg capsule  Take 1 capsule every day by oral route at bedtime., Disp: , Rfl:    VITAMIN D PO, Take by mouth., Disp: , Rfl:   Observations/Objective: Patient is well-developed, well-nourished in no acute distress.  Resting comfortably  Head is normocephalic, atraumatic.  No labored breathing.  Speech is clear and coherent with logical content.  Patient is alert and oriented at baseline.    Assessment and Plan: 1. Muscle spasm - methocarbamol (ROBAXIN) 750 MG tablet; Take 1 tablet (750 mg total) by mouth 4 (four) times daily.  Dispense: 30 tablet; Refill: 0 - methylPREDNISolone (MEDROL DOSEPAK) 4 MG TBPK tablet; 6 day taper; take as directed on package instructions  Dispense: 21 tablet; Refill: 0  - Suspect muscle spasms - Failed NSAIDs - Add Medrol dose pack and Robaxin - Avoid NSAIDs (Ibuprofen/Advil/Motrin or Naproxen/Aleve) while on steroid - Tylenol if okay for breakthrough pain - Heat to area - Epsom salt soak if able to get in and out of bath tub safely - Back exercises and stretches provided via AVS - Seek in person evaluation if worsening or fails to improve with treatment   Follow Up  Instructions: I discussed the assessment and treatment plan with the patient. The patient was provided an opportunity to ask questions and all were answered. The patient agreed with the plan and demonstrated an understanding of the instructions.  A copy of instructions were sent to the patient via MyChart unless otherwise noted below.    The patient was advised to call back or seek an in-person evaluation if the symptoms worsen or if the condition fails to improve as anticipated.    Margaretann Loveless, PA-C

## 2023-04-22 NOTE — ED Notes (Signed)
Greeter reports patient left
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
## 2023-04-22 NOTE — Patient Instructions (Signed)
Veronica [name], thank you for joining Veronica Loveless, PA-C for today's virtual visit.  While this provider is not your primary care provider (PCP), if your PCP is located in our provider database this encounter information will be shared with them immediately following your visit.   A [facility] MyChart account gives you access to today's visit and all your visits, tests, and labs performed at [facility] " click here if you don't have a [facility] MyChart account or go to [email or web address]  Consent: (Patient) Veronica [name] provided verbal consent for this virtual visit at the beginning of the encounter.  Current Medications:  Current Outpatient Medications:    methocarbamol (ROBAXIN) 750 MG tablet, Take 1 tablet (750 mg total) by mouth 4 (four) times daily., Disp: 30 tablet, Rfl: 0   methylPREDNISolone (MEDROL DOSEPAK) 4 MG TBPK tablet, 6 day taper; take as directed on package instructions, Disp: 21 tablet, Rfl: 0   cariprazine (VRAYLAR) 1.5 MG capsule, Vraylar 1.5 mg capsule  Take 1 capsule every day by oral route at bedtime., Disp: , Rfl:    VITAMIN D PO, Take by mouth., Disp: , Rfl:    Medications ordered in this encounter:  Meds ordered this encounter  Medications   methocarbamol (ROBAXIN) 750 MG tablet    Sig: Take 1 tablet (750 mg total) by mouth 4 (four) times daily.    Dispense:  30 tablet    Refill:  0    Order Specific Question:   Supervising Provider    Answer:   Merrilee Jansky [[id number]]   methylPREDNISolone (MEDROL DOSEPAK) 4 MG TBPK tablet    Sig: 6 day taper; take as directed on package instructions    Dispense:  21 tablet    Refill:  0    Order Specific Question:   Supervising Provider    Answer:   Merrilee Jansky [[id number]]     *If you need refills on other medications prior to your next appointment, please contact your pharmacy*  Follow-Up: Call back or seek an in-person evaluation if the symptoms worsen or if the condition fails to  improve as anticipated.  [facility] [phone]  Other Instructions Muscle Cramps and Spasms Muscle cramps and spasms occur when a muscle or muscles tighten and you have no control over this tightening (involuntary muscle contraction). They are a common problem that can happen in any muscle. The most common place is in the calf muscles of the leg. There are a few ways that muscle cramps and spasms differ: Muscle cramps are painful. They come and go and may last for a few seconds or up to 15 minutes. Muscle cramps are often more forceful and last longer than muscle spasms. Muscle spasms may or may not be painful. They may last just a few seconds or last much longer. Certain conditions, such as diabetes or Parkinson's disease, can make you more likely to have cramps or spasms. But in most cases, cramps and spasms are not caused by other conditions. Common causes include: Overexertion. This is when you do more physical work or exercise than your body is ready for. Overuse from doing the same movements too many times. Staying in one position for too long. Improper preparation, form, or technique when playing a sport or doing an activity. Not enough water or other fluids in your body (dehydration). Other causes may include: Injury. Side effects of some medicines. Too few salts and minerals in your body (electrolytes), such as potassium  and calcium. This could happen if you are taking water pills (diuretics) or if you are pregnant. In many cases, the cause of muscle cramps or spasms is not known. Follow these instructions at home: Eating and drinking Drink enough fluid to keep your pee (urine) pale yellow. This can help prevent cramps or spasms. Eat a healthy diet that includes a lot of nutrients to help your muscles work. A healthy diet includes fruits and vegetables, lean protein, whole grains, and low-fat or nonfat dairy products. Managing pain and stiffness     Try to  massage, stretch, and relax the affected muscle. Do this for a few minutes at a time. If told, put ice on the muscles. This may help if you are sore or have pain after a cramp or spasm. Put ice in a plastic bag. Place a towel between your skin and the bag. Leave the ice on for 20 minutes, 2-3 times a day. If told, apply heat to tight or tense muscles as often as told by your health care provider. Use the heat source that your provider recommends, such as a moist heat pack or a heating pad. Place a towel between your skin and the heat source. Leave the heat on for 20-30 minutes. If your skin turns bright red, remove the ice or heat right away to prevent skin damage. The risk of damage is higher if you cannot feel pain, heat, or cold. Take hot showers or baths to help relax tight muscles. General instructions If you are having cramps often, avoid intense exercise for a few days. Take over-the-counter and prescription medicines only as told by your provider. Watch for any changes in your symptoms. Contact a health care provider if: Your cramps or spasms get more severe or happen more often. Your cramps or spasms do not get better over time. This information is not intended to replace advice given to you by your health care provider. Make sure you discuss any questions you have with your health care provider. Document Revised: 01/30/2022 Document Reviewed: 01/30/2022 Elsevier Patient Education  2024 [employer]vier [employer].    If you have been instructed to have an in-person evaluation today at a local Urgent Care facility, please use the link below. It will take you to a list of all of our available [facility], including address, phone number and hours of operation. Please do not delay care.  [facility]  If you or a family member do not have a primary care provider, use the link below to schedule a visit and establish care. When you choose a [facility] primary care physician  or advanced practice provider, you gain a long-term partner in health. Find a Primary Care Provider  Learn more about [facility]'s in-office and virtual care options: [facility] - Get Care Now

## 2023-06-05 ENCOUNTER — Ambulatory Visit (INDEPENDENT_AMBULATORY_CARE_PROVIDER_SITE_OTHER): Payer: 59

## 2023-06-05 VITALS — Ht 66.0 in | Wt 215.0 lb

## 2023-06-05 DIAGNOSIS — Z2821 Immunization not carried out because of patient refusal: Secondary | ICD-10-CM

## 2023-06-05 DIAGNOSIS — Z0000 Encounter for general adult medical examination without abnormal findings: Secondary | ICD-10-CM

## 2023-06-05 DIAGNOSIS — Z1231 Encounter for screening mammogram for malignant neoplasm of breast: Secondary | ICD-10-CM
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
## 2023-06-05 NOTE — Patient Instructions (Signed)
Veronica [name] , Thank you for taking time to come for your Medicare Wellness Visit. I appreciate your ongoing commitment to your health goals. Please review the following plan we discussed and let me know if I can assist you in the future.   Referrals/Orders/Follow-Ups/Clinician Recommendations:  Next Medicare Annual Wellness Visit: June 08, 2024 at 10:00 am virtual visit  You have an order for:  []   2D Mammogram  [x]   3D Mammogram  []   Bone Density   []   Lung Cancer Screening  Please call for appointment:   [facility] [address] [place], [place] [address] [phone]  Make sure to wear two-piece clothing.  No lotions powders or deodorants the day of the appointment Make sure to bring picture ID and insurance card.  Bring list of medications you are currently taking including any supplements.   Schedule your [facility] screening mammogram through MyChart!   Log into your MyChart account.  Go to 'Visit' (or 'Appointments' if on mobile App) --> Schedule an Appointment  Under 'Select a Reason for Visit' choose the Mammogram Screening option.  Complete the pre-visit questions and select the time and place that best fits your schedule.    This is a list of the screening recommended for you and due dates:  Health Maintenance  Topic Date Due   Mammogram  Never done   Flu Shot  Never done   COVID-19 Vaccine (1 - 2023-24 season) Never done   Medicare Annual Wellness Visit  06/04/2024   DTaP/Tdap/Td vaccine (2 - Td or Tdap) 04/17/2025   Pap with HPV screening  10/24/2027   Hepatitis C Screening  Completed   HIV Screening  Completed   HPV Vaccine  Aged Out    Advanced directives: (Declined) Advance directive discussed with you today. Even though you declined this today, please call our office should you change your mind, and we can give you the proper paperwork for you to fill out.  Next Medicare Annual Wellness Visit scheduled for  next year: Yes  Preventive Care 13-24 Years Old, Female Preventive care refers to lifestyle choices and visits with your health care provider that can promote health and wellness. Preventive care visits are also called wellness exams. What can I expect for my preventive care visit? Counseling Your health care provider may ask you questions about your: Medical history, including: Past medical problems. Family medical history. Pregnancy history. Current health, including: Menstrual cycle. Method of birth control. Emotional well-being. Home life and relationship well-being. Sexual activity and sexual health. Lifestyle, including: Alcohol, nicotine or tobacco, and drug use. Access to firearms. Diet, exercise, and sleep habits. Work and work Astronomer. Sunscreen use. Safety issues such as seatbelt and bike helmet use. Physical exam Your health care provider will check your: Height and weight. These may be used to calculate your BMI (body mass index). BMI is a measurement that tells if you are at a healthy weight. Waist circumference. This measures the distance around your waistline. This measurement also tells if you are at a healthy weight and may help predict your risk of certain diseases, such as type 2 diabetes and high blood pressure. Heart rate and blood pressure. Body temperature. Skin for abnormal spots. What immunizations do I need?  Vaccines are usually given at various ages, according to a schedule. Your health care provider will recommend vaccines for you based on your age, medical history, and lifestyle or other factors, such as travel or where you  work. What tests do I need? Screening Your health care provider may recommend screening tests for certain conditions. This may include: Lipid and cholesterol levels. Diabetes screening. This is done by checking your blood sugar (glucose) after you have not eaten for a while (fasting). Pelvic exam and Pap test. Hepatitis B  test. Hepatitis C test. HIV (human immunodeficiency virus) test. STI (sexually transmitted infection) testing, if you are at risk. Lung cancer screening. Colorectal cancer screening. Mammogram. Talk with your health care provider about when you should start having regular mammograms. This may depend on whether you have a family history of breast cancer. BRCA-related cancer screening. This may be done if you have a family history of breast, ovarian, tubal, or peritoneal cancers. Bone density scan. This is done to screen for osteoporosis. Talk with your health care provider about your test results, treatment options, and if necessary, the need for more tests. Follow these instructions at home: Eating and drinking  Eat a diet that includes fresh fruits and vegetables, whole grains, lean protein, and low-fat dairy products. Take vitamin and mineral supplements as recommended by your health care provider. Do not drink alcohol if: Your health care provider tells you not to drink. You are pregnant, may be pregnant, or are planning to become pregnant. If you drink alcohol: Limit how much you have to 0-1 drink a day. Know how much alcohol is in your drink. In the [place] one drink equals one 12 oz bottle of beer (355 mL), one 5 oz glass of wine (148 mL), or one 1 oz glass of hard liquor (44 mL). Lifestyle Brush your teeth every morning and night with fluoride toothpaste. Floss one time each day. Exercise for at least 30 minutes 5 or more days each week. Do not use any products that contain nicotine or tobacco. These products include cigarettes, chewing tobacco, and vaping devices, such as e-cigarettes. If you need help quitting, ask your health care provider. Do not use drugs. If you are sexually active, practice safe sex. Use a condom or other form of protection to prevent STIs. If you do not wish to become pregnant, use a form of birth control. If you plan to become pregnant, see your health care  provider for a prepregnancy visit. Take aspirin only as told by your health care provider. Make sure that you understand how much to take and what form to take. Work with your health care provider to find out whether it is safe and beneficial for you to take aspirin daily. Find healthy ways to manage stress, such as: Meditation, yoga, or listening to music. Journaling. Talking to a trusted person. Spending time with friends and family. Minimize exposure to UV radiation to reduce your risk of skin cancer. Safety Always wear your seat belt while driving or riding in a vehicle. Do not drive: If you have been drinking alcohol. Do not ride with someone who has been drinking. When you are tired or distracted. While texting. If you have been using any mind-altering substances or drugs. Wear a helmet and other protective equipment during sports activities. If you have firearms in your house, make sure you follow all gun safety procedures. Seek help if you have been physically or sexually abused. What's next? Visit your health care provider once a year for an annual wellness visit. Ask your health care provider how often you should have your eyes and teeth checked. Stay up to date on all vaccines. This information is not intended to replace advice given  to you by your health care provider. Make sure you discuss any questions you have with your health care provider. Document Revised: 12/07/2020 Document Reviewed: 12/07/2020 Elsevier Patient Education  2024 [employer].

## 2023-07-08 ENCOUNTER — Ambulatory Visit (HOSPITAL_COMMUNITY)
Admission: EM | Admit: 2023-07-08 | Discharge: 2023-07-08 | Disposition: A | Discharge status: Home / Self Care | Payer: 59 | Attending: Emergency Medicine | Admitting: Emergency Medicine

## 2023-07-08 ENCOUNTER — Encounter (HOSPITAL_COMMUNITY): Payer: Self-pay

## 2023-07-08 DIAGNOSIS — S161XXA Strain of muscle, fascia and tendon at neck level, initial encounter: Secondary | ICD-10-CM

## 2023-07-08 MED ORDER — METHOCARBAMOL 500 MG PO TABS: 500.0000 mg | ORAL_TABLET | Freq: Two times a day (BID) | ORAL | 0 refills | Status: DC

## 2023-07-08 MED ORDER — KETOROLAC TROMETHAMINE 30 MG/ML IJ SOLN
INTRAMUSCULAR | Status: AC
  Filled 2023-07-08: qty 1

## 2023-07-08 MED ORDER — KETOROLAC TROMETHAMINE 30 MG/ML IJ SOLN
30.0000 mg | Freq: Once | INTRAMUSCULAR | Status: AC
  Administered 2023-07-08: 30 mg via INTRAMUSCULAR

## 2023-07-08 MED ORDER — ONDANSETRON 4 MG PO TBDP: 4.0000 mg | ORAL_TABLET | Freq: Three times a day (TID) | ORAL | 0 refills | Status: AC | PRN

## 2023-07-08 NOTE — Discharge Instructions (Signed)
 You can take Robaxin  twice daily as needed for pain and spasms.  Otherwise alternate between Tylenol  ibuprofen  as needed.  You can also use heat and ice to help with pain inflammation.  Return here as needed.  If you develop confusion, weakness, numbness, worsening pain, or passing out please seek immediate medical treatment in the ER.
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
## 2023-07-19 ENCOUNTER — Ambulatory Visit (INDEPENDENT_AMBULATORY_CARE_PROVIDER_SITE_OTHER): Payer: 59 | Admitting: Family Medicine

## 2023-07-19 ENCOUNTER — Encounter: Payer: Self-pay | Admitting: Family Medicine

## 2023-07-19 VITALS — BP 131/79 | HR 66 | Resp 16 | Ht 66.0 in | Wt 226.4 lb

## 2023-07-19 DIAGNOSIS — M62838 Other muscle spasm: Secondary | ICD-10-CM | POA: Insufficient documentation

## 2023-07-19 MED ORDER — TRAMADOL HCL 50 MG PO TABS: 50.0000 mg | ORAL_TABLET | Freq: Three times a day (TID) | ORAL | 0 refills | Status: AC | PRN

## 2023-07-19 NOTE — Patient Instructions (Addendum)
I appreciate the opportunity to provide care to you today!    Follow up:  pcp  Muscle spasm Tramadol: Take 50 mg every 8 hours as needed for pain that is greater than 7 (on a 1-10 pain scale). Robaxin: Continue taking 500 mg twice a day (BID) as prescribed. Rest & Heat Application: Rest the affected area, apply heat to soothe the muscle, and avoid activities that can aggravate the condition (such as bending, heavy lifting, or prolonged standing). Follow-up: Reach out if symptoms worsen or do not improve.  Attached with your AVS, you will find valuable resources for self-education. I highly recommend dedicating some time to thoroughly examine them.   Please continue to a heart-healthy diet and increase your physical activities. Try to exercise for at least five days a week.    It was a pleasure to see you and I look forward to continuing to work together on your health and well-being. Please do not hesitate to call the office if you need care or have questions about your care.  In case of emergency, please visit the Emergency Department for urgent care, or contact our clinic at [phone] to schedule an appointment. We're here to help you!   Have a wonderful day and week. With Gratitude, Gilmore Laroche MSN, FNP-BC
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
## 2023-08-05 ENCOUNTER — Encounter (HOSPITAL_COMMUNITY): Payer: Self-pay

## 2023-08-05 ENCOUNTER — Ambulatory Visit (HOSPITAL_COMMUNITY)
Admission: RE | Admit: 2023-08-05 | Discharge: 2023-08-05 | Disposition: A | Discharge status: Home / Self Care | Payer: 59 | Source: Ambulatory Visit | Attending: Family Medicine | Admitting: Family Medicine

## 2023-08-05 DIAGNOSIS — Z1231 Encounter for screening mammogram for malignant neoplasm of breast: Secondary | ICD-10-CM | POA: Insufficient documentation

## 2023-08-05 DIAGNOSIS — Z0000 Encounter for general adult medical examination without abnormal findings: Secondary | ICD-10-CM | POA: Diagnosis present

## 2023-08-07 ENCOUNTER — Other Ambulatory Visit (HOSPITAL_COMMUNITY): Payer: Self-pay | Admitting: Family Medicine

## 2023-08-07 DIAGNOSIS — R928 Other abnormal and inconclusive findings on diagnostic imaging of breast: Secondary | ICD-10-CM

## 2023-08-15 ENCOUNTER — Ambulatory Visit (HOSPITAL_COMMUNITY)
Admission: RE | Admit: 2023-08-15 | Discharge: 2023-08-15 | Disposition: A | Discharge status: Home / Self Care | Payer: 59 | Source: Ambulatory Visit | Attending: Family Medicine | Admitting: Family Medicine

## 2023-08-15 DIAGNOSIS — R928 Other abnormal and inconclusive findings on diagnostic imaging of breast: Secondary | ICD-10-CM | POA: Diagnosis present

## 2024-05-27 ENCOUNTER — Ambulatory Visit: Payer: Self-pay

## 2024-05-27 NOTE — Telephone Encounter (Signed)
 FYI Only or Action Required?: Action required by provider: request for appointment, clinical question for provider, and request for documentation or forms.  Patient was last seen in primary care on 07/19/2023 by Edman Meade PEDLAR, FNP.  Called Nurse Triage reporting Depression.  Symptoms began several weeks ago.  Interventions attempted: Rest, hydration, or home remedies and Other: patient's dogs have been helping to a point.  Symptoms are: unchanged.  Triage Disposition: See Physician Within 24 Hours  Patient/caregiver understands and will follow disposition?: Yes  Copied from CRM #[id number]. Topic: Clinical - Red Word Triage >> May 27, 2024 12:16 PM Tobias L wrote: Red Word that prompted transfer to Nurse Triage: Patient requesting call from PCP. Patient would like to speak to her in regards to her depression. Patient states she recently lost her dogs at her apartment. Patient now has a new puppy and uses her dogs to help with her depression. 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 Patient requesting letter from Meade to assist with letting puppy stay with her, patient states she does have the paperwork to make the puppy into a service dog.   Chest gets tight when she feels like she has to leave the dog. Reason for Disposition  [1] Depression AND [2] getting worse (e.g., sleeping poorly, less able to do activities of daily living)  Answer Assessment - Initial Assessment Questions Patient with hx of depression is requesting paperwork be signed to make her new puppy an emotional support animal so that she is able to bring the puppy to work with her. Patient states she is having increased anxiety with leaving the puppy at home. Endorses worsening depression symptoms. Patient is audible crying throughout the call. Denies SI or HI. Patient does question if she is needing to go on any medication because of the worsening symptoms. Did give patient information on  [facility] along with information of Suicide Crisis line 15. Patient verbalized understanding. Patient is asking to be able to get paperwork signed and a letter from her doctor that she is needing an emotional support animal while at work. Asking for a call back from the office.   1. CONCERN: What happened that made you call today?     Patient has hx of depression-got a new puppy and patient states she is anxiety with having to leave her puppy at home. Patient is wanting to be able to keep the puppy with her at work. Patient is needing paperwork filled out to make the puppy a service animal.  2. DEPRESSION SYMPTOM SCREENING: How are you feeling overall? (e.g., decreased energy, increased sleeping or difficulty sleeping, difficulty concentrating, feelings of sadness, guilt, hopelessness, or worthlessness)     Feelings of sadness and overwhelmed.  3. RISK OF HARM - SUICIDAL IDEATION:  Do you ever have thoughts of hurting or killing yourself?  (e.g., yes, no, no but preoccupation with thoughts about death)     no 4. RISK OF HARM - HOMICIDAL IDEATION:  Do you ever have thoughts of hurting or killing someone else?  (e.g., yes, no, no but preoccupation with thoughts about death)     no 5. FUNCTIONAL IMPAIRMENT: How have things been going for you overall? Have you had more difficulty than usual doing your normal daily activities?  (e.g., better, same, worse; self-care, school, work, interactions)     Patient states she is able to get herself up and go to work.  6. SUPPORT: Who is  with you now? Who do you live with? Do you have family or friends who you can talk to?      Family lives in [place] . No friends 7. THERAPIST: Do you have a counselor or therapist? If Yes, ask: What is their name?     no 8. STRESSORS: Has there been any new stress or recent changes in your life?     Loss of dogs 9. ALCOHOL USE OR SUBSTANCE USE (DRUG USE): Do you drink alcohol  or use any illegal drugs?     no 10. OTHER: Do you have any other physical symptoms right now? (e.g., fever)       Chest tightness at times  Protocols used: Depression-A-AH

## 2024-05-27 NOTE — Telephone Encounter (Signed)
 scheduled

## 2024-05-28 ENCOUNTER — Ambulatory Visit: Admitting: Family Medicine

## 2024-05-28 ENCOUNTER — Encounter: Payer: Self-pay | Admitting: Family Medicine

## 2024-05-28 VITALS — BP 117/75 | HR 80 | Ht 67.0 in | Wt 225.0 lb

## 2024-05-28 DIAGNOSIS — M5416 Radiculopathy, lumbar region: Secondary | ICD-10-CM

## 2024-05-28 DIAGNOSIS — F339 Major depressive disorder, recurrent, unspecified: Secondary | ICD-10-CM | POA: Insufficient documentation

## 2024-05-28 DIAGNOSIS — E559 Vitamin D deficiency, unspecified: Secondary | ICD-10-CM

## 2024-05-28 MED ORDER — CARIPRAZINE HCL 1.5 MG PO CAPS: 1.5000 mg | ORAL_CAPSULE | Freq: Every evening | ORAL | 3 refills | Status: AC

## 2024-05-28 MED ORDER — METHOCARBAMOL 500 MG PO TABS: 500.0000 mg | ORAL_TABLET | Freq: Two times a day (BID) | ORAL | 1 refills | Status: DC

## 2024-05-28 NOTE — Patient Instructions (Signed)
 I appreciate the opportunity to provide care to you today!    Follow up:  3 months  Labs: please stop by the lab today to get your blood drawn (CBC, CMP, TSH, Lipid profile, HgA1c, Vit D)  Screening: HIV and Hep C   Please follow up if your symptoms worsen or fail to improve.   Please stop by your local pharmacy and get your Tdap and Shingles vaccine  Referrals today-   Attached with your AVS, you will find valuable resources for self-education. I highly recommend dedicating some time to thoroughly examine them.   Please continue to a heart-healthy diet and increase your physical activities. Try to exercise for at least five days a week.    It was a pleasure to see you and I look forward to continuing to work together on your health and well-being. Please do not hesitate to call the office if you need care or have questions about your care.  In case of emergency, please visit the Emergency Department for urgent care, or contact our clinic at [phone] to schedule an appointment. We're here to help you!   Have a wonderful day and week. With Gratitude, Meade JENEANE Gerlach MSN, FNP-BC, PMHNP-BC

## 2024-05-28 NOTE — Assessment & Plan Note (Signed)
 Note provided Encouraged to continue treatment regimen as is
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
## 2024-05-29 LAB — TSH: TSH: 0.743 u[IU]/mL (ref 0.450–4.500)

## 2024-05-29 LAB — CMP14+EGFR
ALT: 12 IU/L (ref 0–32)
AST: 16 IU/L (ref 0–40)
Albumin: 4.3 g/dL (ref 3.9–4.9)
Alkaline Phosphatase: 81 IU/L (ref 41–116)
BUN/Creatinine Ratio: 17 (ref 9–23)
BUN: 12 mg/dL (ref 6–24)
Bilirubin Total: 0.4 mg/dL (ref 0.0–1.2)
CO2: 22 mmol/L (ref 20–29)
Calcium: 9.7 mg/dL (ref 8.7–10.2)
Chloride: 104 mmol/L (ref 96–106)
Creatinine, Ser: 0.69 mg/dL (ref 0.57–1.00)
Globulin, Total: 2.9 g/dL (ref 1.5–4.5)
Glucose: 102 mg/dL — ABNORMAL HIGH (ref 70–99)
Potassium: 4.3 mmol/L (ref 3.5–5.2)
Sodium: 140 mmol/L (ref 134–144)
Total Protein: 7.2 g/dL (ref 6.0–8.5)
eGFR: 112 mL/min/1.73 (ref >59)

## 2024-05-29 LAB — LIPID PANEL
Chol/HDL Ratio: 3.7 ratio (ref 0.0–4.4)
Cholesterol, Total: 214 mg/dL — ABNORMAL HIGH (ref 100–199)
HDL: 58 mg/dL (ref >39)
LDL Chol Calc (NIH): 138 mg/dL — ABNORMAL HIGH (ref 0–99)
Triglycerides: 99 mg/dL (ref 0–149)
VLDL Cholesterol Cal: 18 mg/dL (ref 5–40)

## 2024-05-29 LAB — VITAMIN D 25 HYDROXY (VIT D DEFICIENCY, FRACTURES): Vit D, 25-Hydroxy: 16.3 ng/mL — ABNORMAL LOW (ref 30.0–100.0)

## 2024-05-29 LAB — HEMOGLOBIN A1C
Est. average glucose Bld gHb Est-mCnc: 117 mg/dL
Hgb A1c MFr Bld: 5.7 % — ABNORMAL HIGH (ref 4.8–5.6)

## 2024-06-01 ENCOUNTER — Telehealth: Payer: Self-pay | Admitting: Family Medicine

## 2024-06-01 NOTE — Telephone Encounter (Unsigned)
 Copied from CRM [id number]. Topic: Clinical - Medication Refill >> Jun 01, 2024 11:46 AM Tiffini S wrote: Medication: VITAMIN D  PO  Has the patient contacted their pharmacy? Yes (Agent: If no, request that the patient contact the pharmacy for the refill. If patient does not wish to contact the pharmacy document the reason why and proceed with request.) (Agent: If yes, when and what did the pharmacy advise?)  This is the patient's preferred pharmacy:  [id number] - [place],  - [address] [place] [address] Phone: [phone] Fax: [phone]  Is this the correct pharmacy for this prescription? Yes If no, delete pharmacy and type the correct one.   Has the prescription been filled recently? Yes  Is the patient out of the medication? Yes  Has the patient been seen for an appointment in the last year OR does the patient have an upcoming appointment? Yes  Can we respond through MyChart? Yes  Agent: Please be advised that Rx refills may take up to 3 business days. We ask that you follow-up with your pharmacy.

## 2024-06-01 NOTE — Telephone Encounter (Unsigned)
 Copied from CRM #[id number]. Topic: Clinical - Lab/Test Results >> Jun 01, 2024 11:48 AM Tiffini S wrote: Reason for CRM:  Patient can see lab results in [facility]- asked for a call back to discuss lab work at [phone] (H)

## 2024-06-02 NOTE — Telephone Encounter (Signed)
 Unable to pend

## 2024-06-03 ENCOUNTER — Telehealth: Payer: Self-pay

## 2024-06-03 ENCOUNTER — Ambulatory Visit: Payer: Self-pay | Admitting: Family Medicine

## 2024-06-03 ENCOUNTER — Other Ambulatory Visit: Payer: Self-pay | Admitting: Family Medicine

## 2024-06-03 DIAGNOSIS — E559 Vitamin D deficiency, unspecified: Secondary | ICD-10-CM

## 2024-06-03 MED ORDER — VITAMIN D (ERGOCALCIFEROL) 1.25 MG (50000 UNIT) PO CAPS: 50000.0000 [IU] | ORAL_CAPSULE | ORAL | 1 refills | Status: AC

## 2024-06-03 NOTE — Telephone Encounter (Signed)
 Copied from CRM #[id number]. Topic: Clinical - Medication Question >> Jun 03, 2024 12:43 PM Fonda T wrote: Reason for CRM: Pt calling to check status of medication, Vitamin D .  Pt states provider advised prescription would be sent to pharmacy, however per pharmacy has not received prescription for medication.  Pt is requesting a return call with status update.  Preferred pharmacy:  [facility] #[id number] - [place], [place] - [address] AT [address] [place] [address] Phone: [phone] Fax: [phone]  Pt call back [phone]  Pt is aware of same day call back.

## 2024-06-03 NOTE — Telephone Encounter (Signed)
Sent to provider to review.

## 2024-06-03 NOTE — Progress Notes (Signed)
 Please inform the patient that a prescription for a weekly vitamin D  supplement has been sent to her pharmacy because her vitamin D  level is low. Her labs indicate that she is prediabetic and that her cholesterol levels are elevated, though there has been slight improvement. I recommend implementing lifestyle changes by decreasing her intake of greasy, fatty, and starchy foods, as well as reducing high-sugar foods and beverages, along with increasing physical activity as tolerated. All other labs are stable.

## 2024-06-08 ENCOUNTER — Ambulatory Visit: Payer: 59

## 2024-06-08 VITALS — Ht 67.0 in | Wt 225.0 lb

## 2024-06-08 DIAGNOSIS — Z1231 Encounter for screening mammogram for malignant neoplasm of breast: Secondary | ICD-10-CM

## 2024-06-08 DIAGNOSIS — Z0000 Encounter for general adult medical examination without abnormal findings: Secondary | ICD-10-CM | POA: Diagnosis not present

## 2024-06-08 NOTE — Patient Instructions (Signed)
 Ms. Siguenza,  Thank you for taking the time for your Medicare Wellness Visit. I appreciate your continued commitment to your health goals. Please review the care plan we discussed, and feel free to reach out if I can assist you further.  Please note that Annual Wellness Visits do not include a physical exam. Some assessments may be limited, especially if the visit was conducted virtually. If needed, we may recommend an in-person follow-up with your provider.  Ongoing Care Seeing your primary care provider every 3 to 6 months helps us  monitor your health and provide consistent, personalized care.   1 year follow up for Medicare well visit: June 09, 2025 at 8:00am with medicare wellness nurse in office  Referrals If a referral was made during today's visit and you haven't received any updates within two weeks, please contact the referred provider directly to check on the status.  Mammogram at [facility] Call [phone] to schedule your screening No perfumes, lotions, or deodorants the day of your screening. You can schedule your mammogram through mychart!   Recommended Screenings:  Health Maintenance  Topic Date Due   Hepatitis B Vaccine (1 of 3 - 19+ 3-dose series) Never done   HPV Vaccine (1 - 3-dose SCDM series) Never done   COVID-19 Vaccine (1 - 2025-26 season) Never done   Medicare Annual Wellness Visit  06/04/2024   Breast Cancer Screening  08/04/2024   Flu Shot  09/22/2024*   DTaP/Tdap/Td vaccine (2 - Td or Tdap) 04/17/2025   Pap with HPV screening  10/24/2027   Hepatitis C Screening  Completed   HIV Screening  Completed   Pneumococcal Vaccine  Aged Out   Meningitis B Vaccine  Aged Out  *Topic was postponed. The date shown is not the original due date.       06/04/2024    9:01 AM  Advanced Directives  Does Patient Have a Medical Advance Directive? No  Would patient like information on creating a medical advance directive? No - Patient declined    Vision: Annual  vision screenings are recommended for early detection of glaucoma, cataracts, and diabetic retinopathy. These exams can also reveal signs of chronic conditions such as diabetes and high blood pressure.  Dental: Annual dental screenings help detect early signs of oral cancer, gum disease, and other conditions linked to overall health, including heart disease and diabetes.  Please see the attached documents for additional preventive care recommendations.
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
## 2024-06-27 ENCOUNTER — Emergency Department (HOSPITAL_COMMUNITY)
Admission: EM | Admit: 2024-06-27 | Discharge: 2024-06-27 | Disposition: A | Discharge status: Home / Self Care | Attending: Emergency Medicine | Admitting: Emergency Medicine

## 2024-06-27 ENCOUNTER — Emergency Department (HOSPITAL_COMMUNITY)

## 2024-06-27 ENCOUNTER — Encounter (HOSPITAL_COMMUNITY): Payer: Self-pay

## 2024-06-27 ENCOUNTER — Other Ambulatory Visit: Payer: Self-pay

## 2024-06-27 DIAGNOSIS — R000 Tachycardia, unspecified: Secondary | ICD-10-CM | POA: Diagnosis not present

## 2024-06-27 DIAGNOSIS — S161XXA Strain of muscle, fascia and tendon at neck level, initial encounter: Secondary | ICD-10-CM | POA: Diagnosis not present

## 2024-06-27 DIAGNOSIS — I10 Essential (primary) hypertension: Secondary | ICD-10-CM | POA: Insufficient documentation

## 2024-06-27 DIAGNOSIS — X58XXXA Exposure to other specified factors, initial encounter: Secondary | ICD-10-CM | POA: Insufficient documentation

## 2024-06-27 DIAGNOSIS — M542 Cervicalgia: Secondary | ICD-10-CM | POA: Diagnosis present

## 2024-06-27 MED ORDER — NAPROXEN 500 MG PO TABS: 500.0000 mg | ORAL_TABLET | Freq: Two times a day (BID) | ORAL | 0 refills | Status: DC

## 2024-06-27 MED ORDER — CYCLOBENZAPRINE HCL 10 MG PO TABS: 10.0000 mg | ORAL_TABLET | Freq: Two times a day (BID) | ORAL | 0 refills | Status: DC | PRN

## 2024-06-27 MED ORDER — OXYCODONE HCL 5 MG PO TABS
5.0000 mg | ORAL_TABLET | Freq: Once | ORAL | Status: AC
  Administered 2024-06-27: 5 mg via ORAL
  Filled 2024-06-27: qty 1

## 2024-06-27 MED ORDER — ACETAMINOPHEN 500 MG PO TABS
1000.0000 mg | ORAL_TABLET | Freq: Once | ORAL | Status: AC
  Administered 2024-06-27: 1000 mg via ORAL
  Filled 2024-06-27: qty 2

## 2024-06-27 MED ORDER — METHOCARBAMOL 500 MG PO TABS
500.0000 mg | ORAL_TABLET | Freq: Once | ORAL | Status: AC
  Administered 2024-06-27: 500 mg via ORAL
  Filled 2024-06-27: qty 1

## 2024-06-27 MED ORDER — METHYLPREDNISOLONE SODIUM SUCC 40 MG IJ SOLR
40.0000 mg | Freq: Once | INTRAMUSCULAR | Status: AC
  Administered 2024-06-27: 40 mg via INTRAMUSCULAR
  Filled 2024-06-27: qty 1

## 2024-06-27 MED ORDER — PREDNISONE 20 MG PO TABS: 40.0000 mg | ORAL_TABLET | Freq: Every day | ORAL | 0 refills | Status: DC

## 2024-06-27 MED ORDER — KETOROLAC TROMETHAMINE 15 MG/ML IJ SOLN
15.0000 mg | Freq: Once | INTRAMUSCULAR | Status: AC
  Administered 2024-06-27: 15 mg via INTRAMUSCULAR
  Filled 2024-06-27: qty 1

## 2024-06-27 NOTE — Discharge Instructions (Signed)
 Begin taking prednisone  as prescribed.  May take Flexeril  2-3 times a day as needed for pain/muscle spasms.  May take naproxen  twice a day as needed for pain.  Do not take extra ibuprofen  at the same time.  Apply heat pads to the neck to help with pain and spasms as well.  Please follow-up with spine doctor for any continued pain or symptoms in the neck.  Return to ED if any symptoms worsen including severe headaches, syncopal episode, inability to move arms, severe numbness/tingling/weakness.

## 2024-06-27 NOTE — ED Notes (Signed)
 Patient transported to CT
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
## 2024-06-27 NOTE — ED Triage Notes (Signed)
 Pt from home complains of neck pain, started about 3 days again but has gotten worse. Pt unable to turn head. Endorse burning pain that start at neck and spread to both shoulders. Endorses weakness/tingling in arms and hands. Pt is AAOx4 and Ambulatory. Pt has not taken any meds for pain.

## 2024-06-27 NOTE — ED Notes (Signed)
 Pt/family received d/c paperwork at this time. After going over the paperwork any questions, comments, or concerns were answered to the best of this nurse's knowledge. The pt/family verbally acknowledged the teachings/instructions.

## 2024-06-29 ENCOUNTER — Emergency Department (HOSPITAL_COMMUNITY)
Admission: EM | Admit: 2024-06-29 | Discharge: 2024-06-29 | Disposition: A | Discharge status: Home / Self Care | Attending: Emergency Medicine | Admitting: Emergency Medicine

## 2024-06-29 ENCOUNTER — Encounter (HOSPITAL_COMMUNITY): Payer: Self-pay

## 2024-06-29 ENCOUNTER — Ambulatory Visit: Payer: Self-pay

## 2024-06-29 DIAGNOSIS — M542 Cervicalgia: Secondary | ICD-10-CM | POA: Diagnosis present

## 2024-06-29 MED ORDER — PREDNISONE 20 MG PO TABS: 40.0000 mg | ORAL_TABLET | Freq: Every day | ORAL | 0 refills | Status: AC

## 2024-06-29 MED ORDER — HYDROCODONE-ACETAMINOPHEN 5-325 MG PO TABS: 1.0000 | ORAL_TABLET | Freq: Three times a day (TID) | ORAL | 0 refills | Status: AC | PRN

## 2024-06-29 MED ORDER — DEXAMETHASONE SOD PHOSPHATE PF 10 MG/ML IJ SOLN
10.0000 mg | Freq: Once | INTRAMUSCULAR | Status: AC
  Administered 2024-06-29: 10 mg via INTRAMUSCULAR

## 2024-06-29 MED ORDER — SALONPAS 3.1-6-10 % EX PTCH: 1.0000 | MEDICATED_PATCH | Freq: Two times a day (BID) | CUTANEOUS | 0 refills | Status: AC

## 2024-06-29 MED ORDER — KETOROLAC TROMETHAMINE 15 MG/ML IJ SOLN
15.0000 mg | Freq: Once | INTRAMUSCULAR | Status: AC
  Administered 2024-06-29: 15 mg via INTRAMUSCULAR
  Filled 2024-06-29: qty 1

## 2024-06-29 MED ORDER — OXYCODONE-ACETAMINOPHEN 5-325 MG PO TABS
1.0000 | ORAL_TABLET | Freq: Once | ORAL | Status: AC
  Administered 2024-06-29: 1 via ORAL
  Filled 2024-06-29: qty 1

## 2024-06-29 NOTE — ED Notes (Signed)
  Pt teaching provided on medications that may cause drowsiness. Pt instructed not to drive or operate heavy machinery while taking the prescribed medication. Pt verbalized understanding.   Pt provided discharge instructions and prescription information. Pt was given the opportunity to ask questions and questions were answered.

## 2024-06-29 NOTE — ED Provider Notes (Signed)
 " [place] EMERGENCY DEPARTMENT AT [facility] Provider Note   CSN: [id number] Arrival date & time: 06/29/24  1135     Patient presents with: Neck Pain   Veronica [name] is a 44 y.o. female.   HPI Patient presents for the second time in 3 days now with concern for ongoing neck pain. She notes that since evaluation here following which she was feeling better, she has seemingly exacerbated neck pain which is diffuse, worse with motion, rating down both trapezius regions. No focal weakness, no chest pain, no dyspnea, no fever, chills, nausea, vomiting. She has taken the prescribed medication as directed.    Prior to Admission medications  Medication Sig Start Date End Date Taking? Authorizing Provider  Camphor-Menthol -Methyl Sal (SALONPAS ) 3.06-30-08 % PT[facility] Apply 1 patch topically 2 (two) times daily. 06/29/24  Yes Garrick Charleston, MD  HYDROcodone -acetaminophen  (NORCO/VICODIN) 5-325 MG tablet Take 1 tablet by mouth 3 (three) times daily as needed for severe pain (pain score 7-10). 06/29/24  Yes Garrick Charleston, MD  cariprazine  (VRAYLAR ) 1.5 MG capsule Take 1 capsule (1.5 mg total) by mouth at bedtime. 05/28/24   Bacchus, Gloria Z, FNP  ondansetron  (ZOFRAN -ODT) 4 MG disintegrating tablet Take 1 tablet (4 mg total) by mouth every 8 (eight) hours as needed for nausea or vomiting. 07/08/23   Johnie Flaming A, NP  predniSONE  (DELTASONE ) 20 MG tablet Take 2 tablets (40 mg total) by mouth daily for 5 days. 06/29/24 07/04/24  Garrick Charleston, MD  VITAMIN D  PO Take by mouth.    [provider]  Vitamin D , Ergocalciferol , (DRISDOL ) 1.25 MG (50000 UNIT) CAPS capsule Take 1 capsule (50,000 Units total) by mouth every 7 (seven) days. 06/03/24   Bacchus, Meade PEDLAR, FNP    Allergies: Other, Doxycycline , and Iodine     Review of Systems  Updated Vital Signs BP 131/71 (BP Location: Right Arm)   Pulse 80   Temp 98.4 F (36.9 C) (Oral)   Resp 18   Ht 1.702 m (5' 7)   Wt 102.1 kg   LMP  04/25/2024 (Within Weeks)   SpO2 96%   BMI 35.24 kg/m   Physical Exam Vitals and nursing note reviewed.  Constitutional:      General: She is not in acute distress.    Appearance: She is well-developed. She is obese.  HENT:     Head: Normocephalic and atraumatic.  Eyes:     Conjunctiva/sclera: Conjunctivae normal.  Cardiovascular:     Rate and Rhythm: Normal rate and regular rhythm.  Pulmonary:     Effort: Pulmonary effort is normal. No respiratory distress.     Breath sounds: No stridor.  Abdominal:     General: There is no distension.  Musculoskeletal:     Cervical back: Normal range of motion and neck supple.  Skin:    General: Skin is warm and dry.  Neurological:     General: No focal deficit present.     Mental Status: She is alert and oriented to person, place, and time.     Cranial Nerves: No cranial nerve deficit.  Psychiatric:        Mood and Affect: Mood normal.     (all labs ordered are listed, but only abnormal results are displayed) Labs Reviewed - No data to display  EKG: None  Radiology: No results found.   Procedures   Medications Ordered in the ED  ketorolac  (TORADOL ) 15 MG/ML injection 15 mg (15 mg Intramuscular Given 06/29/24 1419)  dexamethasone  (DECADRON )  injection 10 mg (10 mg Intramuscular Given 06/29/24 1419)  oxyCODONE -acetaminophen  (PERCOCET/ROXICET) 5-325 MG per tablet 1 tablet (1 tablet Oral Given 06/29/24 1427)                                    Medical Decision Making Adult female presents with ongoing neck discomfort.  Patient's vital signs are reassuring, she is otherwise healthy, young, has no history of cervical spine surgery, low suspicion for neuro system disruption with her unremarkable neuroexam.  Suspicion for torticollis versus other soft tissue sprain versus strain. Patient received analgesics here, chart reviewed, consistent with prior similar presentation.   Amount and/or Complexity of Data Reviewed External Data  Reviewed: notes.    Details: ED note reviewed from last week  Risk OTC drugs. Prescription drug management.   On repeat exam patient has improved.  No red flag suggesting nervous system involvement, patient will continue with similar meds follow-up with primary care.     Final diagnoses:  Neck pain    ED Discharge Orders          Ordered    predniSONE  (DELTASONE ) 20 MG tablet  Daily        06/29/24 1528    HYDROcodone -acetaminophen  (NORCO/VICODIN) 5-325 MG tablet  3 times daily PRN        06/29/24 1528    Camphor-Menthol -Methyl Sal (SALONPAS ) 3.06-30-08 % PT[facility]  2 times daily        06/29/24 1528               Garrick Charleston, MD 06/29/24 2101  "

## 2024-06-29 NOTE — ED Triage Notes (Signed)
 Pt c/o L neck pain radiating across both shoulders and down L arm x5 days.  Pain score 10/10.  Pt has been seen recently for same.  Sts she has been taking prescriptions w/o relief.  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

## 2024-06-29 NOTE — Discharge Instructions (Addendum)
 Please be sure to follow-up with your physician.  Return here for concerning changes in your condition.

## 2024-06-29 NOTE — Telephone Encounter (Signed)
 FYI Only or Action Required?: FYI only for provider: patient going back to ER, worsening pain.  Patient was last seen in primary care on 05/28/2024 by Edman Meade PEDLAR, FNP.  Called Nurse Triage reporting Neck Pain.  Symptoms began several days ago.  Interventions attempted: Other: all medications from ER  1/3, nothing helping .  Symptoms are: gradually worsening.  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 Methocarbamol  not working this morning. Can't touch chin to chest now and could after medication. Pain today 11 prednisone  , naproxen  not working. Feeling worse was told to return to ER worsening symptoms patient going back to ER . No available same day visits toady.        1. ONSET: When did the pain begin?      Last Wednesday  2. LOCATION: Where does it hurt?      Neck to stop of shoulder blades both sides  3. PATTERN Does the pain come and go, or has it been constant since it started?      Constant  4. SEVERITY: How bad is the pain?  (Scale 0-10; or none or slight stiffness, mild, moderate, severe)     11 severe  5. RADIATION: Does the pain go anywhere else, shoot into your arms?     Now radiating to shoulder blades 6. CORD SYMPTOMS: Any weakness or numbness of the arms or legs?     Denies  7. CAUSE: What do you think is causing the neck  pain?     mention  work  8. NECK OVERUSE: Any recent activities that involved turning or twisting the neck?     Mentions doing something  at work last Wednesday  9. OTHER SYMPTOMS: Do you have any other symptoms? (e.g., headache, fever, chest pain, difficulty breathing, neck swelling)     Patient reports pain is going from neck behind ears into tops of shoulder blades   Patient  denies chest pain difficulty breathing, fever , headache , dizziness , loss of bowel/bladder control  Protocols used: Neck Pain or Stiffness-A-AH Copied from CRM #[id number]. Topic: Clinical - Red Word Triage >> Jun 29, 2024 11:03 AM Carlyon D wrote: Red Word that prompted transfer to Nurse Triage: Burning pain, swelling,  left side of the nec.  pain and burning goes across the left side of the shoulder as well.

## 2024-07-16 ENCOUNTER — Telehealth: Payer: Self-pay

## 2024-07-16 NOTE — Telephone Encounter (Signed)
 Copied from CRM #[id number]. Topic: Clinical - Medication Question >> Jul 16, 2024  9:44 AM Myrick T wrote: Reason for CRM: Joy from [facility] called to have all patients scripts for Camphor-Menthol -Methyl Sal (SALONPAS ) 3.06-30-08 % PT[facility] cariprazine  (VRAYLAR ) 1.5 MG capsule, HYDROcodone -acetaminophen  (NORCO/VICODIN) 5-325 MG tablet, ondansetron  (ZOFRAN -ODT) 4 MG disintegrating tablet, VITAMIN D  PO and Vitamin D , Ergocalciferol , (DRISDOL ) 1.25 MG (50000 UNIT) CAPS capsulesent to [facility] [employer] [place] [id number]; fax#[phone].

## 2025-06-09 ENCOUNTER — Ambulatory Visit: Payer: Self-pay
